# Patient Record
Sex: Male | Born: 1960
Health system: Southern US, Community
[De-identification: ages and names within clinical notes are randomized; demographics above are authoritative.]

## PROBLEM LIST (undated history)

## (undated) DIAGNOSIS — M109 Gout, unspecified: Secondary | ICD-10-CM

## (undated) DIAGNOSIS — E7800 Pure hypercholesterolemia, unspecified: Secondary | ICD-10-CM

## (undated) DIAGNOSIS — I10 Essential (primary) hypertension: Secondary | ICD-10-CM

## (undated) DIAGNOSIS — M1990 Unspecified osteoarthritis, unspecified site: Secondary | ICD-10-CM

## (undated) DIAGNOSIS — K760 Fatty (change of) liver, not elsewhere classified: Secondary | ICD-10-CM

## (undated) HISTORY — DX: Fatty (change of) liver, not elsewhere classified: K76.0

## (undated) HISTORY — PX: JOINT REPLACEMENT: SHX530

## (undated) HISTORY — PX: BACK SURGERY: SHX140

## (undated) HISTORY — PX: OTHER SURGICAL HISTORY: SHX169

---

## 2007-03-15 ENCOUNTER — Ambulatory Visit (HOSPITAL_COMMUNITY): Admission: RE | Admit: 2007-03-15 | Discharge: 2007-03-15 | Payer: Self-pay | Admitting: Family Medicine

## 2009-07-15 ENCOUNTER — Ambulatory Visit (HOSPITAL_COMMUNITY): Admission: RE | Admit: 2009-07-15 | Discharge: 2009-07-15 | Payer: Self-pay | Admitting: Family Medicine

## 2010-04-02 ENCOUNTER — Ambulatory Visit (HOSPITAL_COMMUNITY): Admission: RE | Admit: 2010-04-02 | Discharge: 2010-04-02 | Payer: Self-pay | Admitting: Family Medicine

## 2010-04-02 ENCOUNTER — Encounter (INDEPENDENT_AMBULATORY_CARE_PROVIDER_SITE_OTHER): Payer: Self-pay | Admitting: *Deleted

## 2010-04-08 ENCOUNTER — Encounter (INDEPENDENT_AMBULATORY_CARE_PROVIDER_SITE_OTHER): Payer: Self-pay | Admitting: *Deleted

## 2010-05-20 ENCOUNTER — Ambulatory Visit: Payer: Self-pay | Admitting: Gastroenterology

## 2010-05-20 DIAGNOSIS — R740 Nonspecific elevation of levels of transaminase and lactic acid dehydrogenase [LDH]: Secondary | ICD-10-CM

## 2010-05-21 ENCOUNTER — Ambulatory Visit (HOSPITAL_COMMUNITY): Admission: RE | Admit: 2010-05-21 | Discharge: 2010-05-21 | Payer: Self-pay | Admitting: Gastroenterology

## 2010-05-21 ENCOUNTER — Encounter: Payer: Self-pay | Admitting: Gastroenterology

## 2010-05-21 LAB — CONVERTED CEMR LAB
AST: 52 units/L — ABNORMAL HIGH (ref 0–37)
Alkaline Phosphatase: 69 units/L (ref 39–117)
BUN: 16 mg/dL (ref 6–23)
CO2: 25 meq/L (ref 19–32)
Chloride: 111 meq/L (ref 96–112)
Creatinine, Ser: 0.9 mg/dL (ref 0.4–1.5)
Glucose, Bld: 101 mg/dL — ABNORMAL HIGH (ref 70–99)
HCT: 39.2 % (ref 39.0–52.0)
Hemoglobin: 13.5 g/dL (ref 13.0–17.0)
IgG (Immunoglobin G), Serum: 1219 mg/dL (ref 694–1618)
IgM, Serum: 106 mg/dL (ref 60–263)
MCHC: 34.5 g/dL (ref 30.0–36.0)
MCV: 93.9 fL (ref 78.0–100.0)
Monocytes Relative: 8.4 % (ref 3.0–12.0)
Neutrophils Relative %: 44.9 % (ref 43.0–77.0)
Potassium: 4.3 meq/L (ref 3.5–5.1)
Total Bilirubin: 0.4 mg/dL (ref 0.3–1.2)
WBC: 6.8 10*3/uL (ref 4.5–10.5)

## 2010-11-16 ENCOUNTER — Ambulatory Visit: Payer: Self-pay | Admitting: Gastroenterology

## 2010-11-17 LAB — CONVERTED CEMR LAB
A-1 Antitrypsin, Ser: 202 mg/dL — ABNORMAL HIGH (ref 83–200)
Hep B S Ab: NEGATIVE
Tissue Transglutaminase Ab, IgA: 2.9 units (ref <20)

## 2010-12-15 NOTE — Assessment & Plan Note (Signed)
History of Present Illness Visit Type: consult  Primary GI MD: Rob Bunting MD Primary Provider: Lilyan Punt, MD Requesting Provider: Lilyan Punt, MD Chief Complaint: Abnormal labs and [place]  History of Present Illness:       very pleasant 50 year old man who is here with wife today.  recent LFTs 2 months ago showed elvated transaminases ) AST and ALT were in the low 60s. The rest of his liver tests were all normal. He a repeat set of liver testing done the end of May and this showed his AST and ALT were 50 and 54 respectively. The rest of his liver tests were normal. His CBC was normal. Hepatitis B. surface antigen was negative hepatitis C antibody was negative, ceruloplasmin level was normal, iron studies were normal, sedimentation rate was slightly elevated at 20.  He started simvastatin in the past 3 or 4 months.  He had an ultrasound done and this showed increased echogenicity of his liver, most consistent with fatty liver disease. They also described an area of further hypo-echogenicity in his liver which was indeterminant in nature. Followup imaging was recommended.    Never told about liver disease in the past.    has lost about 20 pounds in past year, cutting back on sodas a day, other high calorie foods, making a big difference.  started simvastatin in past 3-4 months.  she has never had a tattoo, he has never used IV drugs, he has never received a blood transfusion.           Current Medications (verified): 1)  Hydrochlorothiazide 25 Mg Tabs (Hydrochlorothiazide) .Marland Kitchen.. 1 By Mouth Once Daily 2)  Klor-Con M20 20 Meq Cr-Tabs (Potassium Chloride Crys Cr) .Marland Kitchen.. 1 By Mouth Once Daily 3)  Simvastatin 20 Mg Tabs (Simvastatin) .Marland Kitchen.. 1 By Mouth Once Daily 4)  Allopurinol 100 Mg Tabs (Allopurinol) .Marland Kitchen.. 1 By Mouth Two Times A Day 5)  Lisinopril 5 Mg Tabs (Lisinopril) .Marland Kitchen.. 1 By Mouth Once Daily 6)  Tramadol Hcl 100 Mg Xr24h-Tab (Tramadol Hcl) .... One Tablet By Mouth Two Times A  Day 7)  Colcrys 0.6 Mg Tabs (Colchicine) .... One Tablet By Mouth Once Daily  Allergies (verified): No Known Drug Allergies  Past History:  Past Medical History: Hypertension Autoimmune illness fatty liver Hyperlipidemia morbid obesity gout Arthritis  Past Surgical History: Hernia Surgery At age 46  Family History: no liver disease, no colon cancer  Social History: he is married, he has one son, he works as a Environmental health practitioner, he does not smoke cigarettes, he rarely drinks alcohol.  Review of Systems       Pertinent positive and negative review of systems were noted in the above HPI and GI specific review of systems.  All other review of systems was otherwise negative.   Vital Signs:  Patient profile:   50 year old male Height:      71 inches Weight:      280 pounds BMI:     39.19 BSA:     2.44 Pulse rate:   76 / minute Pulse rhythm:   regular BP sitting:   138 / 84  (left arm) Cuff size:   large  Vitals Entered By: Ok Anis CMA (May 20, 2010 9:13 AM)  Physical Exam  Additional Exam:  Constitutional: morbidly obese, otherwise well-appearing Psychiatric: alert and oriented times 3 Eyes: extraocular movements intact Mouth: oropharynx moist, no lesions Neck: supple, no lymphadenopathy Cardiovascular: heart regular rate and rythm Lungs: CTA bilaterally Abdomen: soft,  non-tender, non-distended, no obvious ascites, no peritoneal signs, normal bowel sounds Extremities: no lower extremity edema bilaterally Skin: no lesions on visible extremities    Impression & Recommendations:  Problem # 1:  elevated liver tests, morbid obesity, abnormal ultrasound of liver I think most likely his transaminases elevation is related to fatty liver disease. Possibly simvastatin is contributing however at such low level transaminase elevation I see no reason for him to stop his cholesterol medicine. We will get further blood workup for other chronic liver disease, see does  summarized below. I have complimented him on his weight loss is encouraged and continue this trend which will likely help his overall health as well as his liver specifically. We will set up an MRI to further evaluate the abnormalities noted by ultrasound, I think it these are fatty liver related. We will contact him when the results are completed  Other Orders: TLB-CBC Platelet - w/Differential (85025-CBCD) TLB-CMP (Comprehensive Metabolic Pnl) (80053-COMP) TLB-PT (Protime) (85610-PTP) TLB-IgA (Immunoglobulin A) (82784-IGA) T-Tissue Transglutamase Ab IgA (16109-60454) T-Alpha-1-Antitrypsin Tot [phone]) T-Anti SMA (29562-13086) T-ANA (57846-96295) T-AMA (28413-24401) T-Hepatitis B Surface Antibody (02725-36644) TLB-IgM (Immunoglobulin M) (82784-IGM) TLB-IgG (Immunoglobulin G) (82784-IGG)  Patient Instructions: 1)  We will set up MRI of liver. 2)  The following labs:  Hepatitis A (IgM and IgG), Hepatitis B surface antibody, ANA, AMA, anti smooth muscle antibody, alpha 1 antitrypsin, TTG, total IgA level, CBC, CMET, INR. 3)  Continue losing weight, this is the most important issue in your overall health and will help your liver too. 4)  A copy of this information will be sent to Dr. Gerda Diss. 5)  The medication list was reviewed and reconciled.  All changed / newly prescribed medications were explained.  A complete medication list was provided to the patient / caregiver.  Appended Document: Orders Update/MRI    Clinical Lists Changes  Orders: Added new Referral order of MRI Liver (MRI Liver) - Signed

## 2010-12-15 NOTE — Letter (Signed)
Summary: New Patient letter  [facility]  [address] [place], [place] [address]   Phone: [phone]  Fax: [phone]       04/08/2010 MRN: [id number]  Manuel Torres [address] [place], [place]  [address]  Dear Manuel Torres,  Welcome to the Gastroenterology Division at [facility]    You are scheduled to see Dr.  Christella Hartigan on 05-20-10 at 9am on the 3rd floor at [facility], [address]  We ask that you try to arrive at our office 15 minutes prior to your appointment time to allow for check-in.  We would like you to complete the enclosed self-administered evaluation form prior to your visit and bring it with you on the day of your appointment.  We will review it with you.  Also, please bring a complete list of all your medications or, if you prefer, bring the medication bottles and we will list them.  Please bring your insurance card so that we may make a copy of it.  If your insurance requires a referral to see a specialist, please bring your referral form from your primary care physician.  Co-payments are due at the time of your visit and may be paid by cash, check or credit card.     Your office visit will consist of a consult with your physician (includes a physical exam), any laboratory testing he/she may order, scheduling of any necessary diagnostic testing (e.g. x-ray, ultrasound, CT-scan), and scheduling of a procedure (e.g. Endoscopy, Colonoscopy) if required.  Please allow enough time on your schedule to allow for any/all of these possibilities.    If you cannot keep your appointment, please call [phone] to cancel or reschedule prior to your appointment date.  This allows [place] the opportunity to schedule an appointment for another patient in need of care.  If you do not cancel or reschedule by 5 p.m. the business day prior to your appointment date, you will be charged a $50.00 late cancellation/no-show fee.    Thank you for choosing [facility] for your medical needs.  We appreciate the opportunity to care for you.  Please visit [place] at our website  to learn more about our practice.                     Sincerely,                                                             The Gastroenterology Division

## 2012-02-10 ENCOUNTER — Ambulatory Visit (HOSPITAL_COMMUNITY)
Admission: RE | Admit: 2012-02-10 | Discharge: 2012-02-10 | Disposition: A | Discharge status: Home / Self Care | Payer: 59 | Source: Ambulatory Visit | Attending: Family Medicine | Admitting: Family Medicine

## 2012-02-10 ENCOUNTER — Other Ambulatory Visit: Payer: Self-pay | Admitting: Family Medicine

## 2012-02-10 DIAGNOSIS — M25551 Pain in right hip: Secondary | ICD-10-CM

## 2012-02-10 DIAGNOSIS — M25559 Pain in unspecified hip: Secondary | ICD-10-CM | POA: Insufficient documentation

## 2012-02-10 DIAGNOSIS — M5137 Other intervertebral disc degeneration, lumbosacral region: Secondary | ICD-10-CM | POA: Insufficient documentation

## 2012-02-10 DIAGNOSIS — M51379 Other intervertebral disc degeneration, lumbosacral region without mention of lumbar back pain or lower extremity pain: Secondary | ICD-10-CM | POA: Insufficient documentation

## 2012-05-24 ENCOUNTER — Telehealth (INDEPENDENT_AMBULATORY_CARE_PROVIDER_SITE_OTHER): Payer: Self-pay | Admitting: *Deleted

## 2012-05-24 ENCOUNTER — Encounter (INDEPENDENT_AMBULATORY_CARE_PROVIDER_SITE_OTHER): Payer: Self-pay | Admitting: *Deleted

## 2012-05-24 ENCOUNTER — Other Ambulatory Visit (INDEPENDENT_AMBULATORY_CARE_PROVIDER_SITE_OTHER): Payer: Self-pay | Admitting: *Deleted

## 2012-05-24 DIAGNOSIS — Z1211 Encounter for screening for malignant neoplasm of colon: Secondary | ICD-10-CM

## 2012-05-24 MED ORDER — PEG-KCL-NACL-NASULF-NA ASC-C 100 G PO SOLR: 1.0000 | Freq: Once | ORAL | Status: DC

## 2012-05-24 NOTE — Telephone Encounter (Signed)
Patient needs movi prep 

## 2012-06-14 ENCOUNTER — Encounter (INDEPENDENT_AMBULATORY_CARE_PROVIDER_SITE_OTHER): Payer: Self-pay | Admitting: *Deleted

## 2012-07-12 ENCOUNTER — Telehealth (INDEPENDENT_AMBULATORY_CARE_PROVIDER_SITE_OTHER): Payer: Self-pay | Admitting: *Deleted

## 2012-07-12 NOTE — Telephone Encounter (Signed)
PCP/Requesting MD: scott luking  Name & DOB: Manuel Torres [birth year]     Procedure: tcs  Reason/Indication:  screening  Has patient had this procedure before?  no  If so, when, by whom and where?    Is there a family history of colon cancer?  no  Who?  What age when diagnosed?    Is patient diabetic?   no      Does patient have prosthetic heart valve?  no  Do you have a pacemaker?  no  Has patient had joint replacement within last 12 months?  no  Is patient on Coumadin, Plavix and/or Aspirin? no  Medications: allopurinol 300 mg daily, nabumetone 500 mg daily, atorvastatin 20 mg daily, lisinopril 5 mg daily, hctz 25 mg daily, klor con 20 meq daily   Allergies: nkda  Medication Adjustment:   Procedure date & time: 08/03/12 at 830

## 2012-07-14 NOTE — Telephone Encounter (Signed)
agree

## 2012-07-24 ENCOUNTER — Encounter (HOSPITAL_COMMUNITY): Payer: Self-pay | Admitting: Pharmacy Technician

## 2012-08-03 ENCOUNTER — Encounter (HOSPITAL_COMMUNITY): Payer: Self-pay | Admitting: *Deleted

## 2012-08-03 ENCOUNTER — Encounter (HOSPITAL_COMMUNITY)
Admission: RE | Disposition: A | Discharge status: Home / Self Care | Payer: Self-pay | Source: Ambulatory Visit | Attending: Internal Medicine

## 2012-08-03 ENCOUNTER — Ambulatory Visit (HOSPITAL_COMMUNITY)
Admission: RE | Admit: 2012-08-03 | Discharge: 2012-08-03 | Disposition: A | Discharge status: Home / Self Care | Payer: 59 | Source: Ambulatory Visit | Attending: Internal Medicine | Admitting: Internal Medicine

## 2012-08-03 DIAGNOSIS — K5730 Diverticulosis of large intestine without perforation or abscess without bleeding: Secondary | ICD-10-CM | POA: Insufficient documentation

## 2012-08-03 DIAGNOSIS — D126 Benign neoplasm of colon, unspecified: Secondary | ICD-10-CM

## 2012-08-03 DIAGNOSIS — Z1211 Encounter for screening for malignant neoplasm of colon: Secondary | ICD-10-CM

## 2012-08-03 DIAGNOSIS — E7800 Pure hypercholesterolemia, unspecified: Secondary | ICD-10-CM | POA: Insufficient documentation

## 2012-08-03 DIAGNOSIS — I10 Essential (primary) hypertension: Secondary | ICD-10-CM | POA: Insufficient documentation

## 2012-08-03 HISTORY — DX: Pure hypercholesterolemia, unspecified: E78.00

## 2012-08-03 HISTORY — DX: Essential (primary) hypertension: I10

## 2012-08-03 HISTORY — PX: COLONOSCOPY: SHX5424

## 2012-08-03 SURGERY — COLONOSCOPY
Anesthesia: Moderate Sedation

## 2012-08-03 MED ORDER — SODIUM CHLORIDE 0.45 % IV SOLN
Freq: Once | INTRAVENOUS | Status: AC
  Administered 2012-08-03: 08:00:00 via INTRAVENOUS

## 2012-08-03 MED ORDER — MIDAZOLAM HCL 5 MG/5ML IJ SOLN
INTRAMUSCULAR | Status: DC | PRN
  Administered 2012-08-03 (×2): 2 mg via INTRAVENOUS
  Administered 2012-08-03: 1 mg via INTRAVENOUS

## 2012-08-03 MED ORDER — MEPERIDINE HCL 50 MG/ML IJ SOLN
INTRAMUSCULAR | Status: DC | PRN
  Administered 2012-08-03 (×2): 25 mg via INTRAVENOUS

## 2012-08-03 MED ORDER — STERILE WATER FOR IRRIGATION IR SOLN
Status: DC | PRN
  Administered 2012-08-03: 09:00:00

## 2012-08-03 MED ORDER — MEPERIDINE HCL 50 MG/ML IJ SOLN
INTRAMUSCULAR | Status: AC
  Filled 2012-08-03: qty 1

## 2012-08-03 MED ORDER — MIDAZOLAM HCL 5 MG/5ML IJ SOLN
INTRAMUSCULAR | Status: AC
  Filled 2012-08-03: qty 10

## 2012-08-03 NOTE — H&P (Signed)
Manuel Torres is an 51 y.o. male.   Chief Complaint: Patient is here for colonoscopy. HPI: Patient is 51 year old [place] American male who is in for screening colonoscopy. He denies abdominal pain change in his bowel habits or rectal bleeding. This is patient's first exam. Family history is negative for colorectal carcinoma.  Past Medical History  Diagnosis Date  . Hypertension   . Hypercholesteremia     Past Surgical History  Procedure Date  . Right inguinal hernia     Family History  Problem Relation Age of Onset  . Colon cancer Other    Social History:  reports that he has never smoked. He does not have any smokeless tobacco history on file. He reports that he drinks alcohol. He reports that he does not use illicit drugs.  Allergies: No Known Allergies  Medications Prior to Admission  Medication Sig Dispense Refill  . ibuprofen (ADVIL,MOTRIN) 200 MG tablet Take 200 mg by mouth every 6 (six) hours as needed. Pain      . peg 3350 powder (MOVIPREP) 100 G SOLR Take 1 kit (100 g total) by mouth once.  1 kit  0  . allopurinol (ZYLOPRIM) 300 MG tablet Take 300 mg by mouth daily.      Marland Kitchen atorvastatin (LIPITOR) 20 MG tablet Take 20 mg by mouth daily.      . hydrochlorothiazide (HYDRODIURIL) 25 MG tablet Take 25 mg by mouth daily.      Marland Kitchen lisinopril (PRINIVIL,ZESTRIL) 5 MG tablet Take 5 mg by mouth daily.      . nabumetone (RELAFEN) 500 MG tablet Take 500 mg by mouth 2 (two) times daily.      . potassium chloride SA (K-DUR,KLOR-CON) 20 MEQ tablet Take 20 mEq by mouth 2 (two) times daily.        No results found for this or any previous visit (from the past 48 hour(s)). No results found.  ROS  Blood pressure 169/96, pulse 74, temperature 97.7 F (36.5 C), temperature source Oral, resp. rate 18, height 5\' 11"  (1.803 m), weight 280 lb (127.007 kg), SpO2 98.00%. Physical Exam  Constitutional: He appears well-developed and well-nourished.  HENT:  Mouth/Throat: Oropharynx is clear  and moist.  Eyes: Conjunctivae normal are normal. No scleral icterus.  Neck: No thyromegaly present.  Cardiovascular: Normal rate, regular rhythm and normal heart sounds.   No murmur heard. Respiratory: Effort normal.  GI: Soft. He exhibits no distension and no mass. There is no tenderness.  Musculoskeletal: He exhibits no edema.  Lymphadenopathy:    He has no cervical adenopathy.  Neurological: He is alert.  Skin: Skin is warm and dry.     Assessment/Plan Average risk screening colonoscopy.  ManuelNAJEEB Torres 08/03/2012, 8:32 AM

## 2012-08-03 NOTE — Op Note (Signed)
COLONOSCOPY PROCEDURE REPORT  PATIENT:  Manuel Torres  MR#:  [id number] Birthdate:  [birth year], 51 y.o., male Endoscopist:  Dr. Malissa Hippo, MD Referred By:  Dr. Lilyan Punt, MD. Procedure Date: 08/03/2012  Procedure:   Colonoscopy  Indications:  Patient is 51 year old African American male was undergoing average risk screening colonoscopy.  Informed Consent:  The procedure and risks were reviewed with the patient and informed consent was obtained.  Medications:  Demerol 50 mg IV Versed 5 mg IV  Description of procedure:  After a digital rectal exam was performed, that colonoscope was advanced from the anus through the rectum and colon to the area of the cecum, ileocecal valve and appendiceal orifice. The cecum was deeply intubated. These structures were well-seen and photographed for the record. From the level of the cecum and ileocecal valve, the scope was slowly and cautiously withdrawn. The mucosal surfaces were carefully surveyed utilizing scope tip to flexion to facilitate fold flattening as needed. The scope was pulled down into the rectum where a thorough exam including retroflexion was performed.  Findings:   Prep excellent. Few scattered small diverticula at sigmoid colon. 4 mm polyp ablated via cold biopsy from mid sigmoid colon. Normal rectal mucosa and  ano-rectal junction.  Therapeutic/Diagnostic Maneuvers Performed:  See above  Complications:  None  Cecal Withdrawal Time:  12 minutes  Impression:  Examination performed to cecum. Mild sigmoid colon diverticulosis. Small polyp ablated via cold biopsy from mid sigmoid colon.  Recommendations:  Standard instructions given. Patient advised to take ibuprofen while he is on Relafen. I will contact patient with biopsy results and further recommendations.  REHMAN,NAJEEB U  08/03/2012 9:01 AM  CC: Dr. Lilyan Punt, MD & Dr. Bonnetta Barry ref. provider found

## 2012-08-07 ENCOUNTER — Encounter (HOSPITAL_COMMUNITY): Payer: Self-pay | Admitting: Internal Medicine

## 2012-08-16 ENCOUNTER — Encounter (INDEPENDENT_AMBULATORY_CARE_PROVIDER_SITE_OTHER): Payer: Self-pay | Admitting: *Deleted

## 2012-09-07 ENCOUNTER — Other Ambulatory Visit: Payer: Self-pay | Admitting: Orthopedic Surgery

## 2012-09-07 MED ORDER — DEXAMETHASONE SODIUM PHOSPHATE 10 MG/ML IJ SOLN: 10.0000 mg | Freq: Once | INTRAMUSCULAR | Status: DC

## 2012-09-07 MED ORDER — BUPIVACAINE LIPOSOME 1.3 % IJ SUSP: 20.0000 mL | Freq: Once | INTRAMUSCULAR | Status: DC

## 2012-09-07 NOTE — Progress Notes (Signed)
Preoperative surgical orders have been place into the Epic hospital system for Manuel Torres on 09/07/2012, 2:44 PM  by Patrica Duel for surgery on 10/23/12.  Preop Total Hip orders including Experel Injecion, IV Tylenol, and IV Decadron as long as there are no contraindications to the above medications. Avel Peace, PA-C

## 2012-10-16 ENCOUNTER — Encounter (HOSPITAL_COMMUNITY): Payer: Self-pay | Admitting: Pharmacy Technician

## 2012-10-18 ENCOUNTER — Ambulatory Visit (HOSPITAL_COMMUNITY)
Admission: RE | Admit: 2012-10-18 | Discharge: 2012-10-18 | Disposition: A | Discharge status: Home / Self Care | Payer: 59 | Source: Ambulatory Visit | Attending: Orthopedic Surgery | Admitting: Orthopedic Surgery

## 2012-10-18 ENCOUNTER — Encounter (HOSPITAL_COMMUNITY): Payer: Self-pay

## 2012-10-18 ENCOUNTER — Encounter (HOSPITAL_COMMUNITY)
Admission: RE | Admit: 2012-10-18 | Discharge: 2012-10-18 | Disposition: A | Discharge status: Home / Self Care | Payer: 59 | Source: Ambulatory Visit | Attending: Orthopedic Surgery | Admitting: Orthopedic Surgery

## 2012-10-18 DIAGNOSIS — M169 Osteoarthritis of hip, unspecified: Secondary | ICD-10-CM | POA: Insufficient documentation

## 2012-10-18 DIAGNOSIS — Z01818 Encounter for other preprocedural examination: Secondary | ICD-10-CM | POA: Insufficient documentation

## 2012-10-18 DIAGNOSIS — M1610 Unilateral primary osteoarthritis, unspecified hip: Secondary | ICD-10-CM | POA: Insufficient documentation

## 2012-10-18 HISTORY — DX: Gout, unspecified: M10.9

## 2012-10-18 HISTORY — DX: Unspecified osteoarthritis, unspecified site: M19.90

## 2012-10-18 LAB — SURGICAL PCR SCREEN
MRSA, PCR: NEGATIVE
Staphylococcus aureus: POSITIVE — AB

## 2012-10-18 LAB — CBC
HCT: 42.9 % (ref 39.0–52.0)
Hemoglobin: 15.3 g/dL (ref 13.0–17.0)
RDW: 13 % (ref 11.5–15.5)
WBC: 5.5 10*3/uL (ref 4.0–10.5)

## 2012-10-18 LAB — COMPREHENSIVE METABOLIC PANEL
ALT: 43 U/L (ref 0–53)
Albumin: 3.7 g/dL (ref 3.5–5.2)
Alkaline Phosphatase: 99 U/L (ref 39–117)
BUN: 10 mg/dL (ref 6–23)
Chloride: 101 mEq/L (ref 96–112)
Glucose, Bld: 105 mg/dL — ABNORMAL HIGH (ref 70–99)
Potassium: 4.2 mEq/L (ref 3.5–5.1)
Sodium: 137 mEq/L (ref 135–145)
Total Bilirubin: 0.3 mg/dL (ref 0.3–1.2)

## 2012-10-18 LAB — APTT: aPTT: 36 seconds (ref 24–37)

## 2012-10-18 LAB — URINALYSIS, ROUTINE W REFLEX MICROSCOPIC
Leukocytes, UA: NEGATIVE
Nitrite: NEGATIVE
Specific Gravity, Urine: 1.019 (ref 1.005–1.030)
Urobilinogen, UA: 1 mg/dL (ref 0.0–1.0)
pH: 5.5 (ref 5.0–8.0)

## 2012-10-18 LAB — ABO/RH: ABO/RH(D): AB POS

## 2012-10-18 LAB — PROTIME-INR: Prothrombin Time: 12.7 seconds (ref 11.6–15.2)

## 2012-10-18 NOTE — Progress Notes (Signed)
10/18/12 1045  OBSTRUCTIVE SLEEP APNEA  Have you ever been diagnosed with sleep apnea through a sleep study? No  Do you snore loudly (loud enough to be heard through closed doors)?  0  Do you often feel tired, fatigued, or sleepy during the daytime? 0  Has anyone observed you stop breathing during your sleep? 0  Do you have, or are you being treated for high blood pressure? 1  BMI more than 35 kg/m2? 1  Age over 52 years old? 1  Neck circumference greater than 40 cm/18 inches? 0  Gender: 1  Obstructive Sleep Apnea Score 4   Score 4 or greater  Results sent to PCP

## 2012-10-18 NOTE — Patient Instructions (Signed)
YOUR SURGERY IS SCHEDULED AT [facility]  ON:  Monday  12/9  AT 3:15 PM  REPORT TO [facility]ORT STAY [facility] AT:  12:45 PM      PHONE # FOR SHORT STAY IS [phone]  DO NOT EAT ANYTHING AFTER MIDNIGHT THE NIGHT BEFORE YOUR SURGERY.  NO FOOD, NO CHEWING GUM, NO MINTS, NO CANDIES, NO CHEWING TOBACCO. YOU MAY HAVE CLEAR LIQUIDS TO DRINK FROM MIDNIGHT THE NIGHT BEFORE SURGERY - UNTIL 9:00 AM DAY OF SURGERY --LIKE WATER, SODA, APPLE, GRAPE OR CRANBERRY JUICE.   NOTHING TO DRINK AFTER 9:00 AM DAY OF SURGERY.  PLEASE TAKE THE FOLLOWING MEDICATIONS THE AM OF YOUR SURGERY WITH A FEW SIPS OF WATER:  ALLOPURINOL AND ATORVASTATIN   IF YOU USE INHALERS--USE YOUR INHALERS THE AM OF YOUR SURGERY AND BRING INHALERS TO THE HOSPITAL -TAKE TO SURGERY.    IF YOU ARE DIABETIC:  DO NOT TAKE ANY DIABETIC MEDICATIONS THE AM OF YOUR SURGERY.  IF YOU TAKE INSULIN IN THE EVENINGS--PLEASE ONLY TAKE 1/2 NORMAL EVENING DOSE THE NIGHT BEFORE YOUR SURGERY.  NO INSULIN THE AM OF YOUR SURGERY.  IF YOU HAVE SLEEP APNEA AND USE CPAP OR BIPAP--PLEASE BRING THE MASK AND THE TUBING.  DO NOT BRING YOUR MACHINE.  DO NOT BRING VALUABLES, MONEY, CREDIT CARDS.  DO NOT WEAR JEWELRY, MAKE-UP, NAIL POLISH AND NO METAL PINS OR CLIPS IN YOUR HAIR. CONTACT LENS, DENTURES / PARTIALS, GLASSES SHOULD NOT BE WORN TO SURGERY AND IN MOST CASES-HEARING AIDS WILL NEED TO BE REMOVED.  BRING YOUR GLASSES CASE, ANY EQUIPMENT NEEDED FOR YOUR CONTACT LENS. FOR PATIENTS ADMITTED TO THE HOSPITAL--CHECK OUT TIME THE DAY OF DISCHARGE IS 11:00 AM.  ALL INPATIENT ROOMS ARE PRIVATE - WITH BATHROOM, TELEPHONE, TELEVISION AND WIFI INTERNET.  IF YOU ARE BEING DISCHARGED THE SAME DAY OF YOUR SURGERY--YOU CAN NOT DRIVE YOURSELF HOME--AND SHOULD NOT GO HOME ALONE BY TAXI OR BUS.  NO DRIVING OR OPERATING MACHINERY FOR 24 HOURS FOLLOWING ANESTHESIA / PAIN MEDICATIONS.  PLEASE MAKE ARRANGEMENTS FOR SOMEONE TO BE WITH YOU AT HOME THE FIRST 24 HOURS AFTER  SURGERY. RESPONSIBLE DRIVER'S NAME___________________________                                               PHONE #   _______________________                                  PLEASE READ OVER ANY  FACT SHEETS THAT YOU WERE GIVEN: MRSA INFORMATION, BLOOD TRANSFUSION INFORMATION, INCENTIVE SPIROMETER INFORMATION.

## 2012-10-18 NOTE — Pre-Procedure Instructions (Signed)
PREOP CBC, CMET, PT, PTT, UA, T/S AND CXR WERE DONE TODAY AT [facility] AS PER ORDERS DR. Lequita Halt AND ANESTHESIOLOGIST'S GUIDELINES.  PT'S EKG REPORT FROM DR. S. Gerda Diss ON PT'S CHART FROM 08/21/12.

## 2012-10-22 ENCOUNTER — Other Ambulatory Visit: Payer: Self-pay | Admitting: Orthopedic Surgery

## 2012-10-22 NOTE — H&P (Signed)
Manuel Torres  DOB: Jul 19, 1961 Married / Language: English / Race: Black or African American Male  Date of Admission:  10/23/2012  Chief Complaint:  Right Hip Pain  History of Present Illness The patient is a 51 year old male who comes in for a preoperative History and Physical. The patient is scheduled for a right total hip arthroplasty to be performed by Dr. Gus Rankin. Aluisio, MD at [facility] on 10/23/12. Hip problem is described as the following: The patient reports right hip (worse than left) problems including pain symptoms that have been present for 4 year(s). The symptoms began without any known injury. Prior to being seen, the patient was previously evaluated by a colleague. Previous workup for this problem has included hip x-rays (most recent xrays were in March. AP pelvis and 2 views of the right hip are on the [facility] system) and pelvic MRI. Previous treatment for this problem has included corticosteroid injection (in the right) and nonsteroidal anti-inflammatory drugs. His knee is not bothering him anywhere as near as bad as his hips. The right hip is hurting at all times day and night. This is extremely limiting. He cannot do what he desires. He cannot move his leg at all at the level of the hip. He is having pain radiating down his leg. He cannot put shoes or socks on anymore. The left hip is troublesome also but not as bad as the right. He is not having any back pain. He is ready to proceed with hip surgery. They have been treated conservatively in the past for the above stated problem and despite conservative measures, they continue to have progressive pain and severe functional limitations and dysfunction. They have failed non-operative management including home exercise, medications. It is felt that they would benefit from undergoing total joint replacement. Risks and benefits of the procedure have been discussed with the patient and they elect to proceed  with surgery. There are no active contraindications to surgery such as ongoing infection or rapidly progressive neurological disease.   Problem List Osteoarthritis, Hip (715.35) Osteoarthritis, knee (715.96)   Allergies No Known Drug Allergies   Family History Diabetes Mellitus. mother Heart Disease. father and grandmother fathers side Heart disease in male family member before age 71 Cancer. grandmother mothers side Cerebrovascular Accident. father Hypertension. mother, father and grandmother fathers side   Social History Living situation. live with spouse Marital status. married Most recent primary occupation. Maintenance Illicit drug use. no Current work status. working full time Drug/Alcohol Rehab (Currently). no Exercise. Exercises rarely; does running / walking Tobacco use. never smoker Tobacco / smoke exposure. yes Number of flights of stairs before winded. 1 Pain Contract. no Previously in rehab. no Alcohol use. current drinker; drinks beer and hard liquor; only occasionally per week Children. 1 Post-Surgical Plans. Plan is to go home.   Medication History Allopurinol (300MG  Tablet, Oral) Active. Klor-Con M20 ( Tablet ER, Oral) Active. Atorvastatin Calcium (20MG  Tablet, Oral) Active. Lisinopril (5MG  Tablet, Oral) Active. Hydrochlorothiazide (25MG  Tablet, Oral) Active.   Past Surgical History Inguinal Hernia Repair. open: right  Medical History Hypercholesterolemia Chronic Pain Gout High blood pressure   Review of Systems General:Not Present- Chills, Fever, Night Sweats, Fatigue, Weight Gain, Weight Loss and Memory Loss. Skin:Not Present- Hives, Itching, Rash, Eczema and Lesions. HEENT:Not Present- Tinnitus, Headache, Double Vision, Visual Loss, Hearing Loss and Dentures. Respiratory:Not Present- Shortness of breath with exertion, Shortness of breath at rest, Allergies, Coughing up blood and Chronic  Cough. Cardiovascular:Not Present- Chest  Pain, Racing/skipping heartbeats, Difficulty Breathing Lying Down, Murmur, Swelling and Palpitations. Gastrointestinal:Not Present- Bloody Stool, Heartburn, Abdominal Pain, Vomiting, Nausea, Constipation, Diarrhea, Difficulty Swallowing, Jaundice and Loss of appetitie. Male Genitourinary:Not Present- Urinary frequency, Blood in Urine, Weak urinary stream, Discharge, Flank Pain, Incontinence, Painful Urination, Urgency, Urinary Retention and Urinating at Night. Musculoskeletal:Present- Joint Pain. Not Present- Muscle Weakness, Muscle Pain, Joint Swelling, Back Pain, Morning Stiffness and Spasms. Neurological:Not Present- Tremor, Dizziness, Blackout spells, Paralysis, Difficulty with balance and Weakness. Psychiatric:Not Present- Insomnia.   Vitals Weight: 280 lb Height: 71 in Weight was reported by patient. Height was reported by patient. Body Surface Area: 2.52 m Body Mass Index: 39.05 kg/m Pulse: 64 (Regular) Resp.: 12 (Unlabored) BP: 154/94 (Sitting, Left Arm, Standard)    Physical Exam The physical exam findings are as follows:  Note: Patient is a 51 year old male with continued hip pain. Patient is accompanied today by his wife on exam.   General Mental Status - Alert, cooperative and good historian. General Appearance- pleasant. Not in acute distress. Orientation- Oriented X3. Build & Nutrition- Well nourished and Well developed.   Head and Neck Head- normocephalic, atraumatic . Neck Global Assessment- supple. no bruit auscultated on the right and no bruit auscultated on the left.   Eye Pupil- Bilateral- Regular and Round. Note: wears glasses Motion- Bilateral- EOMI.   Chest and Lung Exam Auscultation: Breath sounds:- clear at anterior chest wall and - clear at posterior chest wall. Adventitious sounds:- No Adventitious sounds.   Cardiovascular Auscultation:Rhythm- Regular rate  and rhythm. Heart Sounds- S1 WNL and S2 WNL. Murmurs & Other Heart Sounds:Auscultation of the heart reveals - No Murmurs.   Abdomen Inspection:Contour- Generalized moderate distention. Palpation/Percussion:Tenderness- Abdomen is non-tender to palpation. Rigidity (guarding)- Abdomen is soft. Auscultation:Auscultation of the abdomen reveals - Bowel sounds normal.   Male Genitourinary Not done, not pertinent to present illness  Musculoskeletal On exam, well developed male alert and oriented in no apparent distress. Evaluation of his right hip flexion to 90. No internal or external rotation. No abduction or adduction. Left hip flexion to 95. No internal rotation, about 10 external rotation and 20 abduction.  Knee exams show moderate crepitus on range of motion both knees. Range is about 5-125 on each side. No medial or lateral joint line tenderness. No instability noted. Pulse, sensation and motor intact both lower extremities.  RADIOGRAPHS: AP of both knees and lateral show that he has patellofemoral arthritis, left worse than right knee as well as some medial compartment arthritis. He is not bone on bone. AP pelvis and lateral of both hips show amongst the worst arthritis I have seen. He essentially has fused his right hip with massive osteophyte formation. He is bone on bone throughout. His left hip also has bone on bone changes but there is a tiny bit of joint space left. Large osteophytes on the left but no where near as bad as the right.  Assessment & Plan Osteoarthritis, Hip (715.35) Impression: Right Knee  Note: Plan is for a right total hip replacement by Dr. Lequita Halt.  Plan is to go home.  PCP - Dr. Lilyan Punt  Signed electronically by Roberts Gaudy, PA-C

## 2012-10-23 ENCOUNTER — Inpatient Hospital Stay (HOSPITAL_COMMUNITY)
Admission: RE | Admit: 2012-10-23 | Discharge: 2012-10-25 | DRG: 470 | Disposition: A | Discharge status: Home Health | Payer: 59 | Source: Ambulatory Visit | Attending: Orthopedic Surgery | Admitting: Orthopedic Surgery

## 2012-10-23 ENCOUNTER — Encounter (HOSPITAL_COMMUNITY): Payer: Self-pay | Admitting: Anesthesiology

## 2012-10-23 ENCOUNTER — Encounter (HOSPITAL_COMMUNITY)
Admission: RE | Disposition: A | Discharge status: Home Health | Payer: Self-pay | Source: Ambulatory Visit | Attending: Orthopedic Surgery

## 2012-10-23 ENCOUNTER — Encounter (HOSPITAL_COMMUNITY): Payer: Self-pay | Admitting: *Deleted

## 2012-10-23 ENCOUNTER — Ambulatory Visit (HOSPITAL_COMMUNITY): Payer: 59

## 2012-10-23 ENCOUNTER — Ambulatory Visit (HOSPITAL_COMMUNITY): Payer: 59 | Admitting: Anesthesiology

## 2012-10-23 DIAGNOSIS — I10 Essential (primary) hypertension: Secondary | ICD-10-CM | POA: Diagnosis present

## 2012-10-23 DIAGNOSIS — Z96649 Presence of unspecified artificial hip joint: Secondary | ICD-10-CM

## 2012-10-23 DIAGNOSIS — M169 Osteoarthritis of hip, unspecified: Principal | ICD-10-CM | POA: Diagnosis present

## 2012-10-23 DIAGNOSIS — M1610 Unilateral primary osteoarthritis, unspecified hip: Principal | ICD-10-CM | POA: Diagnosis present

## 2012-10-23 DIAGNOSIS — E871 Hypo-osmolality and hyponatremia: Secondary | ICD-10-CM | POA: Diagnosis not present

## 2012-10-23 HISTORY — PX: TOTAL HIP ARTHROPLASTY: SHX124

## 2012-10-23 LAB — TYPE AND SCREEN

## 2012-10-23 SURGERY — ARTHROPLASTY, HIP, TOTAL,POSTERIOR APPROACH
Anesthesia: General | Site: Hip | Laterality: Right | Wound class: Clean

## 2012-10-23 MED ORDER — CHLORHEXIDINE GLUCONATE 4 % EX LIQD
60.0000 mL | Freq: Once | CUTANEOUS | Status: DC
  Filled 2012-10-23: qty 60

## 2012-10-23 MED ORDER — SODIUM CHLORIDE 0.9 % IV SOLN
INTRAVENOUS | Status: DC
  Administered 2012-10-23: 19:00:00 via INTRAVENOUS

## 2012-10-23 MED ORDER — TRAMADOL HCL 50 MG PO TABS: 50.0000 mg | ORAL_TABLET | Freq: Four times a day (QID) | ORAL | Status: DC | PRN

## 2012-10-23 MED ORDER — HYDROMORPHONE HCL PF 1 MG/ML IJ SOLN
0.2500 mg | INTRAMUSCULAR | Status: DC | PRN
  Administered 2012-10-23 (×4): 0.5 mg via INTRAVENOUS

## 2012-10-23 MED ORDER — DEXAMETHASONE SODIUM PHOSPHATE 10 MG/ML IJ SOLN: 10.0000 mg | Freq: Once | INTRAMUSCULAR | Status: AC

## 2012-10-23 MED ORDER — METOCLOPRAMIDE HCL 5 MG/ML IJ SOLN: 5.0000 mg | Freq: Three times a day (TID) | INTRAMUSCULAR | Status: DC | PRN

## 2012-10-23 MED ORDER — DIPHENHYDRAMINE HCL 12.5 MG/5ML PO ELIX: 12.5000 mg | ORAL_SOLUTION | ORAL | Status: DC | PRN

## 2012-10-23 MED ORDER — LABETALOL HCL 5 MG/ML IV SOLN
5.0000 mg | INTRAVENOUS | Status: DC | PRN
  Administered 2012-10-23 (×2): 5 mg via INTRAVENOUS

## 2012-10-23 MED ORDER — ACETAMINOPHEN 10 MG/ML IV SOLN
1000.0000 mg | Freq: Four times a day (QID) | INTRAVENOUS | Status: AC
  Administered 2012-10-23 – 2012-10-24 (×3): 1000 mg via INTRAVENOUS
  Filled 2012-10-23 (×4): qty 100

## 2012-10-23 MED ORDER — OXYCODONE HCL 5 MG PO TABS
5.0000 mg | ORAL_TABLET | ORAL | Status: DC | PRN
  Administered 2012-10-24 – 2012-10-25 (×7): 10 mg via ORAL
  Filled 2012-10-23 (×7): qty 2

## 2012-10-23 MED ORDER — SODIUM CHLORIDE 0.9 % IV SOLN: INTRAVENOUS | Status: DC

## 2012-10-23 MED ORDER — BISACODYL 10 MG RE SUPP: 10.0000 mg | Freq: Every day | RECTAL | Status: DC | PRN

## 2012-10-23 MED ORDER — DEXTROSE 5 % IV SOLN
3.0000 g | INTRAVENOUS | Status: AC
  Administered 2012-10-23: 3 g via INTRAVENOUS
  Filled 2012-10-23: qty 3000

## 2012-10-23 MED ORDER — FLEET ENEMA 7-19 GM/118ML RE ENEM: 1.0000 | ENEMA | Freq: Once | RECTAL | Status: AC | PRN

## 2012-10-23 MED ORDER — MENTHOL 3 MG MT LOZG: 1.0000 | LOZENGE | OROMUCOSAL | Status: DC | PRN

## 2012-10-23 MED ORDER — METHOCARBAMOL 500 MG PO TABS
500.0000 mg | ORAL_TABLET | Freq: Four times a day (QID) | ORAL | Status: DC | PRN
  Administered 2012-10-24 – 2012-10-25 (×2): 500 mg via ORAL
  Filled 2012-10-23 (×2): qty 1

## 2012-10-23 MED ORDER — FENTANYL CITRATE 0.05 MG/ML IJ SOLN
INTRAMUSCULAR | Status: DC | PRN
  Administered 2012-10-23: 50 ug via INTRAVENOUS
  Administered 2012-10-23 (×2): 100 ug via INTRAVENOUS

## 2012-10-23 MED ORDER — SUCCINYLCHOLINE CHLORIDE 20 MG/ML IJ SOLN
INTRAMUSCULAR | Status: DC | PRN
  Administered 2012-10-23: 100 mg via INTRAVENOUS

## 2012-10-23 MED ORDER — PROMETHAZINE HCL 25 MG/ML IJ SOLN: 6.2500 mg | INTRAMUSCULAR | Status: DC | PRN

## 2012-10-23 MED ORDER — DEXAMETHASONE 4 MG PO TABS
10.0000 mg | ORAL_TABLET | Freq: Once | ORAL | Status: AC
  Administered 2012-10-24: 10 mg via ORAL
  Filled 2012-10-23: qty 1

## 2012-10-23 MED ORDER — LABETALOL HCL 5 MG/ML IV SOLN
INTRAVENOUS | Status: DC | PRN
  Administered 2012-10-23: 5 mg via INTRAVENOUS

## 2012-10-23 MED ORDER — PROPOFOL 10 MG/ML IV BOLUS
INTRAVENOUS | Status: DC | PRN
  Administered 2012-10-23: 200 mg via INTRAVENOUS

## 2012-10-23 MED ORDER — ATORVASTATIN CALCIUM 20 MG PO TABS
20.0000 mg | ORAL_TABLET | Freq: Every day | ORAL | Status: DC
  Administered 2012-10-24: 20 mg via ORAL
  Filled 2012-10-23 (×2): qty 1

## 2012-10-23 MED ORDER — ONDANSETRON HCL 4 MG/2ML IJ SOLN
INTRAMUSCULAR | Status: DC | PRN
  Administered 2012-10-23: 4 mg via INTRAVENOUS

## 2012-10-23 MED ORDER — CEFAZOLIN SODIUM-DEXTROSE 2-3 GM-% IV SOLR
2.0000 g | Freq: Four times a day (QID) | INTRAVENOUS | Status: AC
  Administered 2012-10-23 – 2012-10-24 (×2): 2 g via INTRAVENOUS
  Filled 2012-10-23 (×2): qty 50

## 2012-10-23 MED ORDER — LABETALOL HCL 5 MG/ML IV SOLN
INTRAVENOUS | Status: AC
  Filled 2012-10-23: qty 4

## 2012-10-23 MED ORDER — HYDROMORPHONE HCL PF 1 MG/ML IJ SOLN
INTRAMUSCULAR | Status: AC
  Filled 2012-10-23: qty 1

## 2012-10-23 MED ORDER — BUPIVACAINE LIPOSOME 1.3 % IJ SUSP
20.0000 mL | Freq: Once | INTRAMUSCULAR | Status: AC
  Administered 2012-10-23: 20 mL
  Filled 2012-10-23: qty 20

## 2012-10-23 MED ORDER — PHENOL 1.4 % MT LIQD: 1.0000 | OROMUCOSAL | Status: DC | PRN

## 2012-10-23 MED ORDER — ONDANSETRON HCL 4 MG PO TABS: 4.0000 mg | ORAL_TABLET | Freq: Four times a day (QID) | ORAL | Status: DC | PRN

## 2012-10-23 MED ORDER — ACETAMINOPHEN 650 MG RE SUPP: 650.0000 mg | Freq: Four times a day (QID) | RECTAL | Status: DC | PRN

## 2012-10-23 MED ORDER — CEFAZOLIN SODIUM-DEXTROSE 2-3 GM-% IV SOLR
INTRAVENOUS | Status: AC
  Filled 2012-10-23: qty 50

## 2012-10-23 MED ORDER — LIDOCAINE HCL (CARDIAC) 20 MG/ML IV SOLN
INTRAVENOUS | Status: DC | PRN
  Administered 2012-10-23: 50 mg via INTRAVENOUS

## 2012-10-23 MED ORDER — HYDROMORPHONE HCL PF 1 MG/ML IJ SOLN
INTRAMUSCULAR | Status: DC | PRN
  Administered 2012-10-23 (×4): 1 mg via INTRAVENOUS

## 2012-10-23 MED ORDER — ALLOPURINOL 300 MG PO TABS
300.0000 mg | ORAL_TABLET | Freq: Every day | ORAL | Status: DC
  Administered 2012-10-24 – 2012-10-25 (×2): 300 mg via ORAL
  Filled 2012-10-23 (×3): qty 1

## 2012-10-23 MED ORDER — POTASSIUM CHLORIDE CRYS ER 20 MEQ PO TBCR
20.0000 meq | EXTENDED_RELEASE_TABLET | Freq: Every day | ORAL | Status: DC
  Administered 2012-10-23 – 2012-10-25 (×3): 20 meq via ORAL
  Filled 2012-10-23 (×3): qty 1

## 2012-10-23 MED ORDER — ACETAMINOPHEN 10 MG/ML IV SOLN: 1000.0000 mg | Freq: Once | INTRAVENOUS | Status: DC

## 2012-10-23 MED ORDER — METOCLOPRAMIDE HCL 10 MG PO TABS: 5.0000 mg | ORAL_TABLET | Freq: Three times a day (TID) | ORAL | Status: DC | PRN

## 2012-10-23 MED ORDER — MORPHINE SULFATE 2 MG/ML IJ SOLN
1.0000 mg | INTRAMUSCULAR | Status: DC | PRN
  Administered 2012-10-23 – 2012-10-24 (×2): 2 mg via INTRAVENOUS
  Filled 2012-10-23 (×2): qty 1

## 2012-10-23 MED ORDER — CEFAZOLIN SODIUM 1-5 GM-% IV SOLN
INTRAVENOUS | Status: AC
  Filled 2012-10-23: qty 50

## 2012-10-23 MED ORDER — POLYETHYLENE GLYCOL 3350 17 G PO PACK: 17.0000 g | PACK | Freq: Every day | ORAL | Status: DC | PRN

## 2012-10-23 MED ORDER — SODIUM CHLORIDE 0.9 % IJ SOLN
INTRAMUSCULAR | Status: DC | PRN
  Administered 2012-10-23: 50 mL via INTRAVENOUS

## 2012-10-23 MED ORDER — MIDAZOLAM HCL 5 MG/5ML IJ SOLN
INTRAMUSCULAR | Status: DC | PRN
  Administered 2012-10-23: 2 mg via INTRAVENOUS

## 2012-10-23 MED ORDER — HYDROCHLOROTHIAZIDE 25 MG PO TABS
25.0000 mg | ORAL_TABLET | Freq: Every day | ORAL | Status: DC
  Administered 2012-10-24: 25 mg via ORAL
  Filled 2012-10-23 (×2): qty 1

## 2012-10-23 MED ORDER — ONDANSETRON HCL 4 MG/2ML IJ SOLN: 4.0000 mg | Freq: Four times a day (QID) | INTRAMUSCULAR | Status: DC | PRN

## 2012-10-23 MED ORDER — LACTATED RINGERS IV SOLN
INTRAVENOUS | Status: DC
  Administered 2012-10-23: 15:00:00 via INTRAVENOUS
  Administered 2012-10-23: 1000 mL via INTRAVENOUS

## 2012-10-23 MED ORDER — RIVAROXABAN 10 MG PO TABS
10.0000 mg | ORAL_TABLET | Freq: Every day | ORAL | Status: DC
  Administered 2012-10-24 – 2012-10-25 (×2): 10 mg via ORAL
  Filled 2012-10-23 (×3): qty 1

## 2012-10-23 MED ORDER — DOCUSATE SODIUM 100 MG PO CAPS
100.0000 mg | ORAL_CAPSULE | Freq: Two times a day (BID) | ORAL | Status: DC
  Administered 2012-10-24 – 2012-10-25 (×3): 100 mg via ORAL

## 2012-10-23 MED ORDER — ROCURONIUM BROMIDE 100 MG/10ML IV SOLN
INTRAVENOUS | Status: DC | PRN
  Administered 2012-10-23: 5 mg via INTRAVENOUS
  Administered 2012-10-23: 35 mg via INTRAVENOUS

## 2012-10-23 MED ORDER — GLYCOPYRROLATE 0.2 MG/ML IJ SOLN
INTRAMUSCULAR | Status: DC | PRN
  Administered 2012-10-23: .7 mg via INTRAVENOUS

## 2012-10-23 MED ORDER — NEOSTIGMINE METHYLSULFATE 1 MG/ML IJ SOLN
INTRAMUSCULAR | Status: DC | PRN
  Administered 2012-10-23: 4 mg via INTRAVENOUS

## 2012-10-23 MED ORDER — METHOCARBAMOL 100 MG/ML IJ SOLN
500.0000 mg | Freq: Four times a day (QID) | INTRAVENOUS | Status: DC | PRN
  Administered 2012-10-23: 500 mg via INTRAVENOUS
  Filled 2012-10-23: qty 5

## 2012-10-23 MED ORDER — ACETAMINOPHEN 325 MG PO TABS: 650.0000 mg | ORAL_TABLET | Freq: Four times a day (QID) | ORAL | Status: DC | PRN

## 2012-10-23 SURGICAL SUPPLY — 52 items
BAG ZIPLOCK 12X15 (MISCELLANEOUS) ×2 IMPLANT
BIT DRILL 2.8X128 (BIT) ×2 IMPLANT
BLADE EXTENDED COATED 6.5IN (ELECTRODE) ×2 IMPLANT
BLADE SAW SAG 73X25 THK (BLADE) ×1
BLADE SAW SGTL 73X25 THK (BLADE) ×1 IMPLANT
CLOTH BEACON ORANGE TIMEOUT ST (SAFETY) ×2 IMPLANT
DECANTER SPIKE VIAL GLASS SM (MISCELLANEOUS) ×2 IMPLANT
DRAPE INCISE IOBAN 66X45 STRL (DRAPES) ×2 IMPLANT
DRAPE ORTHO SPLIT 77X108 STRL (DRAPES) ×2
DRAPE POUCH INSTRU U-SHP 10X18 (DRAPES) ×2 IMPLANT
DRAPE SURG ORHT 6 SPLT 77X108 (DRAPES) ×2 IMPLANT
DRAPE U-SHAPE 47X51 STRL (DRAPES) ×2 IMPLANT
DRSG ADAPTIC 3X8 NADH LF (GAUZE/BANDAGES/DRESSINGS) ×2 IMPLANT
DRSG MEPILEX BORDER 4X4 (GAUZE/BANDAGES/DRESSINGS) ×2 IMPLANT
DRSG MEPILEX BORDER 4X8 (GAUZE/BANDAGES/DRESSINGS) ×2 IMPLANT
DURAPREP 26ML APPLICATOR (WOUND CARE) ×2 IMPLANT
ELECT REM PT RETURN 9FT ADLT (ELECTROSURGICAL) ×2
ELECTRODE REM PT RTRN 9FT ADLT (ELECTROSURGICAL) ×1 IMPLANT
EVACUATOR 1/8 PVC DRAIN (DRAIN) ×2 IMPLANT
FACESHIELD LNG OPTICON STERILE (SAFETY) ×8 IMPLANT
GAUZE SPONGE 2X2 8PLY STRL LF (GAUZE/BANDAGES/DRESSINGS) ×1 IMPLANT
GLOVE BIO SURGEON STRL SZ8 (GLOVE) ×2 IMPLANT
GLOVE BIOGEL PI IND STRL 8 (GLOVE) ×2 IMPLANT
GLOVE BIOGEL PI INDICATOR 8 (GLOVE) ×2
GLOVE ECLIPSE 8.0 STRL XLNG CF (GLOVE) ×2 IMPLANT
GLOVE SURG SS PI 6.5 STRL IVOR (GLOVE) ×4 IMPLANT
GOWN STRL NON-REIN LRG LVL3 (GOWN DISPOSABLE) ×4 IMPLANT
GOWN STRL REIN XL XLG (GOWN DISPOSABLE) ×2 IMPLANT
IMMOBILIZER KNEE 20 (SOFTGOODS)
IMMOBILIZER KNEE 20 THIGH 36 (SOFTGOODS) IMPLANT
IMMOBILIZER KNEE 22 UNIV (SOFTGOODS) ×2 IMPLANT
KIT BASIN OR (CUSTOM PROCEDURE TRAY) ×2 IMPLANT
MANIFOLD NEPTUNE II (INSTRUMENTS) ×2 IMPLANT
NDL SAFETY ECLIPSE 18X1.5 (NEEDLE) ×1 IMPLANT
NEEDLE HYPO 18GX1.5 SHARP (NEEDLE) ×1
NS IRRIG 1000ML POUR BTL (IV SOLUTION) ×2 IMPLANT
PACK TOTAL JOINT (CUSTOM PROCEDURE TRAY) ×2 IMPLANT
PASSER SUT SWANSON 36MM LOOP (INSTRUMENTS) ×2 IMPLANT
POSITIONER SURGICAL ARM (MISCELLANEOUS) ×2 IMPLANT
SPONGE GAUZE 2X2 STER 10/PKG (GAUZE/BANDAGES/DRESSINGS) ×1
SPONGE GAUZE 4X4 12PLY (GAUZE/BANDAGES/DRESSINGS) ×2 IMPLANT
STRIP CLOSURE SKIN 1/2X4 (GAUZE/BANDAGES/DRESSINGS) ×2 IMPLANT
SUT ETHIBOND NAB CT1 #1 30IN (SUTURE) ×4 IMPLANT
SUT MNCRL AB 4-0 PS2 18 (SUTURE) ×2 IMPLANT
SUT VIC AB 2-0 CT1 27 (SUTURE) ×3
SUT VIC AB 2-0 CT1 TAPERPNT 27 (SUTURE) ×3 IMPLANT
SUT VLOC 180 0 24IN GS25 (SUTURE) ×4 IMPLANT
SYR 50ML LL SCALE MARK (SYRINGE) ×2 IMPLANT
TOWEL OR 17X26 10 PK STRL BLUE (TOWEL DISPOSABLE) ×4 IMPLANT
TOWEL OR NON WOVEN STRL DISP B (DISPOSABLE) ×2 IMPLANT
TRAY FOLEY CATH 14FRSI W/METER (CATHETERS) ×2 IMPLANT
WATER STERILE IRR 1500ML POUR (IV SOLUTION) ×4 IMPLANT

## 2012-10-23 NOTE — Op Note (Signed)
Pre-operative diagnosis- Osteoarthritis Right hip  Post-operative diagnosis- Osteoarthritis  Right hip  Procedure-  RightTotal Hip Arthroplasty  Surgeon- Manuel Rankin. Edelmira Gallogly, MD  Assistant- Dimitri Ped, PA-C   Anesthesia  General  EBL- 150   Drain Hemovac   Complication- None  Condition-PACU - hemodynamically stable.   Brief Clinical Note- Manuel Torres is a 51 y.o. male with end stage arthritis of his right hip with progressively worsening pain and dysfunction. Pain occurs with activity and rest including pain at night. He has tried analgesics, protected weight bearing and rest without benefit. Pain is too severe to attempt physical therapy. Radiographs demonstrate severe bone on bone arthritis with subchondral cyst formation and massive circumferential osteophytes. He presents now for right THA.  Procedure in detail-   The patient is brought into the operating room and placed on the operating table. After successful administration of General  anesthesia, the patient is placed in the  Left lateral decubitus position with the  Right side up and held in place with the hip positioner. The lower extremity is isolated from the perineum with plastic drapes and time-out is performed by the surgical team. The lower extremity is then prepped and draped in the usual sterile fashion. A short posterolateral incision is made with a ten blade through the subcutaneous tissue to the level of the fascia lata which is incised in line with the skin incision. The sciatic nerve is palpated and protected and the short external rotators and capsule are isolated from the femur. The hip is then dislocated and the center of the femoral head is marked. A trial prosthesis is placed such that the trial head corresponds to the center of the patients' native femoral head. The resection level is marked on the femoral neck and the resection is made with an oscillating saw. The femoral head is removed and femoral  retractors placed to gain access to the femoral canal.      The canal finder is passed into the femoral canal and the canal is thoroughly irrigated with sterile saline to remove the fatty contents. Axial reaming is performed to 15.5  mm, proximal reaming to 20 D  and the sleeve machined to a small. A 20 D small trial sleeve is placed into the proximal femur.      The femur is then retracted anteriorly to gain acetabular exposure. Acetabular retractors are placed and the labrum and osteophytes are removed, Acetabular reaming is performed to 53  mm and a 54  mm Pinnacle acetabular shell is placed in anatomic position with excellent purchase. Additional dome screws were not needed. The permanent 36 mm neutral + 4 Marathon liner is placed into the acetabular shell.      The trial femur is then placed into the femoral canal. The size is 20 x 15  stem with a 36 + 12  neck and a 36 + 6 head with the neck version matching  the patients' native anteversion. The hip is reduced with excellent stability with full extension and full external rotation, 70 degrees flexion with 40 degrees adduction and 90 degrees internal rotation and 90 degrees of flexion with 70 degrees of internal rotation. The operative leg is placed on top of the non-operative leg and the leg lengths are found to be equal. The trials are then removed and the permanent implant of the same size is impacted into the femoral canal. The ceramic femoral head of the same size as the trial is placed and the hip is  reduced with the same stability parameters. The operative leg is again placed on top of the non-operative leg and the leg lengths are found to be equal.      The wound is then copiously irrigated with saline solution and the capsule and short external rotators are re-attached to the femur through drill holes with Ethibond suture. The fascia lata is closed over a hemovac drain with #1 vicryl suture and the fascia lata, gluteal muscles and subcutaneous  tissues are injected with Exparel 20ml diluted with saline 50ml. The subcutaneous tissues are closed with #1 and2-0 vicryl and the subcuticular layer closed with running 4-0 Monocryl. The drain is hooked to suction, incision cleaned and dried, and steri-srips and a bulky sterile dressing applied. The limb is placed into a knee immobilizer and the patient is awakened and transported to recovery in stable condition.      Please note that a surgical assistant was a medical necessity for this procedure in order to perform it in a safe and expeditious manner. The assistant was necessary to provide retraction to the vital neurovascular structures and to retract and position the limb to allow for anatomic placement of the prosthetic components.  Manuel Rankin Tonda Wiederhold, MD    10/23/2012, 4:04 PM

## 2012-10-23 NOTE — Interval H&P Note (Signed)
History and Physical Interval Note:  10/23/2012 2:22 PM  Manuel Torres  has presented today for surgery, with the diagnosis of Osteoarthritis of the Right hip  The various methods of treatment have been discussed with the patient and family. After consideration of risks, benefits and other options for treatment, the patient has consented to  Procedure(s) (LRB) with comments: TOTAL HIP ARTHROPLASTY (Right) as a surgical intervention .  The patient's history has been reviewed, patient examined, no change in status, stable for surgery.  I have reviewed the patient's chart and labs.  Questions were answered to the patient's satisfaction.     Loanne Drilling

## 2012-10-23 NOTE — Transfer of Care (Signed)
Immediate Anesthesia Transfer of Care Note  Patient: Manuel Torres  Procedure(s) Performed: Procedure(s) (LRB): TOTAL HIP ARTHROPLASTY (Right)  Patient Location: PACU  Anesthesia Type: General  Level of Consciousness: sedated, patient cooperative and responds to stimulaton  Airway & Oxygen Therapy: Patient Spontanous Breathing and Patient connected to face mask oxgen  Post-op Assessment: Report given to PACU RN and Post -op Vital signs reviewed and stable  Post vital signs: Reviewed and stable  Complications: No apparent anesthesia complications

## 2012-10-23 NOTE — Anesthesia Postprocedure Evaluation (Signed)
  Anesthesia Post-op Note  Patient: Manuel Torres  Procedure(s) Performed: Procedure(s) (LRB): TOTAL HIP ARTHROPLASTY (Right)  Patient Location: PACU  Anesthesia Type: General  Level of Consciousness: awake and alert   Airway and Oxygen Therapy: Patient Spontanous Breathing  Post-op Pain: mild  Post-op Assessment: Post-op Vital signs reviewed, Patient's Cardiovascular Status Stable, Respiratory Function Stable, Patent Airway and No signs of Nausea or vomiting  Last Vitals:  Filed Vitals:   10/23/12 1715  BP: 164/91  Pulse:   Temp:   Resp:     Post-op Vital Signs: stable   Complications: No apparent anesthesia complications. BP near baseline after 15 mg labetolol.

## 2012-10-23 NOTE — H&P (View-Only) (Signed)
Manuel Torres  DOB: 10/05/1961 Married / Language: English / Race: Black or African American Male  Date of Admission:  10/23/2012  Chief Complaint:  Right Hip Pain  History of Present Illness The patient is a 51 year old male who comes in for a preoperative History and Physical. The patient is scheduled for a right total hip arthroplasty to be performed by Dr. Frank V. Aluisio, MD at [facility] on 10/23/12. Hip problem is described as the following: The patient reports right hip (worse than left) problems including pain symptoms that have been present for 4 year(s). The symptoms began without any known injury. Prior to being seen, the patient was previously evaluated by a colleague. Previous workup for this problem has included hip x-rays (most recent xrays were in March. AP pelvis and 2 views of the right hip are on the [facility] system) and pelvic MRI. Previous treatment for this problem has included corticosteroid injection (in the right) and nonsteroidal anti-inflammatory drugs. His knee is not bothering him anywhere as near as bad as his hips. The right hip is hurting at all times day and night. This is extremely limiting. He cannot do what he desires. He cannot move his leg at all at the level of the hip. He is having pain radiating down his leg. He cannot put shoes or socks on anymore. The left hip is troublesome also but not as bad as the right. He is not having any back pain. He is ready to proceed with hip surgery. They have been treated conservatively in the past for the above stated problem and despite conservative measures, they continue to have progressive pain and severe functional limitations and dysfunction. They have failed non-operative management including home exercise, medications. It is felt that they would benefit from undergoing total joint replacement. Risks and benefits of the procedure have been discussed with the patient and they elect to proceed  with surgery. There are no active contraindications to surgery such as ongoing infection or rapidly progressive neurological disease.   Problem List Osteoarthritis, Hip (715.35) Osteoarthritis, knee (715.96)   Allergies No Known Drug Allergies   Family History Diabetes Mellitus. mother Heart Disease. father and grandmother fathers side Heart disease in male family member before age 55 Cancer. grandmother mothers side Cerebrovascular Accident. father Hypertension. mother, father and grandmother fathers side   Social History Living situation. live with spouse Marital status. married Most recent primary occupation. Maintenance Illicit drug use. no Current work status. working full time Drug/Alcohol Rehab (Currently). no Exercise. Exercises rarely; does running / walking Tobacco use. never smoker Tobacco / smoke exposure. yes Number of flights of stairs before winded. 1 Pain Contract. no Previously in rehab. no Alcohol use. current drinker; drinks beer and hard liquor; only occasionally per week Children. 1 Post-Surgical Plans. Plan is to go home.   Medication History Allopurinol (300MG Tablet, Oral) Active. Klor-Con M20 (20MEQ Tablet ER, Oral) Active. Atorvastatin Calcium (20MG Tablet, Oral) Active. Lisinopril (5MG Tablet, Oral) Active. Hydrochlorothiazide (25MG Tablet, Oral) Active.   Past Surgical History Inguinal Hernia Repair. open: right  Medical History Hypercholesterolemia Chronic Pain Gout High blood pressure   Review of Systems General:Not Present- Chills, Fever, Night Sweats, Fatigue, Weight Gain, Weight Loss and Memory Loss. Skin:Not Present- Hives, Itching, Rash, Eczema and Lesions. HEENT:Not Present- Tinnitus, Headache, Double Vision, Visual Loss, Hearing Loss and Dentures. Respiratory:Not Present- Shortness of breath with exertion, Shortness of breath at rest, Allergies, Coughing up blood and Chronic  Cough. Cardiovascular:Not Present- Chest   Pain, Racing/skipping heartbeats, Difficulty Breathing Lying Down, Murmur, Swelling and Palpitations. Gastrointestinal:Not Present- Bloody Stool, Heartburn, Abdominal Pain, Vomiting, Nausea, Constipation, Diarrhea, Difficulty Swallowing, Jaundice and Loss of appetitie. Male Genitourinary:Not Present- Urinary frequency, Blood in Urine, Weak urinary stream, Discharge, Flank Pain, Incontinence, Painful Urination, Urgency, Urinary Retention and Urinating at Night. Musculoskeletal:Present- Joint Pain. Not Present- Muscle Weakness, Muscle Pain, Joint Swelling, Back Pain, Morning Stiffness and Spasms. Neurological:Not Present- Tremor, Dizziness, Blackout spells, Paralysis, Difficulty with balance and Weakness. Psychiatric:Not Present- Insomnia.   Vitals Weight: 280 lb Height: 71 in Weight was reported by patient. Height was reported by patient. Body Surface Area: 2.52 m Body Mass Index: 39.05 kg/m Pulse: 64 (Regular) Resp.: 12 (Unlabored) BP: 154/94 (Sitting, Left Arm, Standard)    Physical Exam The physical exam findings are as follows:  Note: Patient is a 51 year old male with continued hip pain. Patient is accompanied today by his wife on exam.   General Mental Status - Alert, cooperative and good historian. General Appearance- pleasant. Not in acute distress. Orientation- Oriented X3. Build & Nutrition- Well nourished and Well developed.   Head and Neck Head- normocephalic, atraumatic . Neck Global Assessment- supple. no bruit auscultated on the right and no bruit auscultated on the left.   Eye Pupil- Bilateral- Regular and Round. Note: wears glasses Motion- Bilateral- EOMI.   Chest and Lung Exam Auscultation: Breath sounds:- clear at anterior chest wall and - clear at posterior chest wall. Adventitious sounds:- No Adventitious sounds.   Cardiovascular Auscultation:Rhythm- Regular rate  and rhythm. Heart Sounds- S1 WNL and S2 WNL. Murmurs & Other Heart Sounds:Auscultation of the heart reveals - No Murmurs.   Abdomen Inspection:Contour- Generalized moderate distention. Palpation/Percussion:Tenderness- Abdomen is non-tender to palpation. Rigidity (guarding)- Abdomen is soft. Auscultation:Auscultation of the abdomen reveals - Bowel sounds normal.   Male Genitourinary Not done, not pertinent to present illness  Musculoskeletal On exam, well developed male alert and oriented in no apparent distress. Evaluation of his right hip flexion to 90. No internal or external rotation. No abduction or adduction. Left hip flexion to 95. No internal rotation, about 10 external rotation and 20 abduction.  Knee exams show moderate crepitus on range of motion both knees. Range is about 5-125 on each side. No medial or lateral joint line tenderness. No instability noted. Pulse, sensation and motor intact both lower extremities.  RADIOGRAPHS: AP of both knees and lateral show that he has patellofemoral arthritis, left worse than right knee as well as some medial compartment arthritis. He is not bone on bone. AP pelvis and lateral of both hips show amongst the worst arthritis I have seen. He essentially has fused his right hip with massive osteophyte formation. He is bone on bone throughout. His left hip also has bone on bone changes but there is a tiny bit of joint space left. Large osteophytes on the left but no where near as bad as the right.  Assessment & Plan Osteoarthritis, Hip (715.35) Impression: Right Knee  Note: Plan is for a right total hip replacement by Dr. Aluisio.  Plan is to go home.  PCP - Dr. Scott Luking  Signed electronically by DREW L Gery Sabedra, PA-C  

## 2012-10-23 NOTE — Plan of Care (Signed)
Problem: Consults Goal: Diagnosis- Total Joint Replacement Right total hip     

## 2012-10-23 NOTE — Anesthesia Preprocedure Evaluation (Addendum)
Anesthesia Evaluation  Patient identified by MRN, date of birth, ID band Patient awake    Reviewed: Allergy & Precautions, H&P , NPO status , Patient's Chart, lab work & pertinent test results, reviewed documented beta blocker date and time   Airway Mallampati: II TM Distance: >3 FB Neck ROM: full    Dental No notable dental hx.    Pulmonary neg pulmonary ROS,  breath sounds clear to auscultation  Pulmonary exam normal       Cardiovascular Exercise Tolerance: Good hypertension, Pt. on medications Rhythm:regular Rate:Normal     Neuro/Psych negative neurological ROS  negative psych ROS   GI/Hepatic negative GI ROS, Neg liver ROS,   Endo/Other  negative endocrine ROS  Renal/GU negative Renal ROS  negative genitourinary   Musculoskeletal   Abdominal   Peds  Hematology negative hematology ROS (+)   Anesthesia Other Findings   Reproductive/Obstetrics negative OB ROS                          Anesthesia Physical Anesthesia Plan  ASA: II  Anesthesia Plan: General ETT   Post-op Pain Management:    Induction:   Airway Management Planned:   Additional Equipment:   Intra-op Plan:   Post-operative Plan:   Informed Consent: I have reviewed the patients History and Physical, chart, labs and discussed the procedure including the risks, benefits and alternatives for the proposed anesthesia with the patient or authorized representative who has indicated his/her understanding and acceptance.   Dental Advisory Given  Plan Discussed with: CRNA  Anesthesia Plan Comments:         Anesthesia Quick Evaluation

## 2012-10-24 ENCOUNTER — Encounter (HOSPITAL_COMMUNITY): Payer: Self-pay | Admitting: Orthopedic Surgery

## 2012-10-24 DIAGNOSIS — E871 Hypo-osmolality and hyponatremia: Secondary | ICD-10-CM

## 2012-10-24 LAB — CBC
Hemoglobin: 13.4 g/dL (ref 13.0–17.0)
MCH: 31.2 pg (ref 26.0–34.0)
Platelets: 297 10*3/uL (ref 150–400)
RBC: 4.29 MIL/uL (ref 4.22–5.81)
WBC: 6.6 10*3/uL (ref 4.0–10.5)

## 2012-10-24 LAB — BASIC METABOLIC PANEL
CO2: 27 mEq/L (ref 19–32)
Chloride: 98 mEq/L (ref 96–112)
Glucose, Bld: 161 mg/dL — ABNORMAL HIGH (ref 70–99)
Potassium: 4.1 mEq/L (ref 3.5–5.1)
Sodium: 133 mEq/L — ABNORMAL LOW (ref 135–145)

## 2012-10-24 MED ORDER — HYDROCHLOROTHIAZIDE 25 MG PO TABS
25.0000 mg | ORAL_TABLET | Freq: Every day | ORAL | Status: DC
  Administered 2012-10-25: 25 mg via ORAL

## 2012-10-24 MED ORDER — METHOCARBAMOL 500 MG PO TABS: 500.0000 mg | ORAL_TABLET | Freq: Four times a day (QID) | ORAL | Status: DC | PRN

## 2012-10-24 MED ORDER — RIVAROXABAN 10 MG PO TABS: 10.0000 mg | ORAL_TABLET | Freq: Every day | ORAL | Status: DC

## 2012-10-24 MED ORDER — ATORVASTATIN CALCIUM 20 MG PO TABS
20.0000 mg | ORAL_TABLET | Freq: Every day | ORAL | Status: DC
  Administered 2012-10-25: 20 mg via ORAL

## 2012-10-24 MED ORDER — OXYCODONE HCL 5 MG PO TABS: 5.0000 mg | ORAL_TABLET | ORAL | Status: DC | PRN

## 2012-10-24 NOTE — Progress Notes (Signed)
Physical Therapy Treatment Patient Details Name: Manuel Torres MRN: [id number] DOB: 12/22/1960 Today's Date: 10/24/2012 Time: 2536-6440 PT Time Calculation (min): 32 min  PT Assessment / Plan / Recommendation Comments on Treatment Session       Follow Up Recommendations  Home health PT     Does the patient have the potential to tolerate intense rehabilitation     Barriers to Discharge        Equipment Recommendations  Rolling walker with 5" wheels    Recommendations for Other Services OT consult  Frequency 7X/week   Plan Discharge plan remains appropriate    Precautions / Restrictions Precautions Precautions: Posterior Hip Precaution Comments: pt recalls 2/3 THP without cues Restrictions Weight Bearing Restrictions: No Other Position/Activity Restrictions: WBAT   Pertinent Vitals/Pain 3-4/10; ice pack provided    Mobility  Bed Mobility Bed Mobility: Sit to Supine Sit to Supine: 3: Mod assist Details for Bed Mobility Assistance: cues for sequence, use of UEs and L LE to self assist and adherence to THP Transfers Transfers: Sit to Stand;Stand to Sit Sit to Stand: 4: Min assist;From chair/3-in-1;With armrests;3: Mod assist Stand to Sit: 4: Min assist Details for Transfer Assistance: cues for ue's/le's Ambulation/Gait Ambulation/Gait Assistance: 4: Min assist;4: Min guard Ambulation Distance (Feet): 62 Feet (and 20) Assistive device: Rolling walker Ambulation/Gait Assistance Details: cues for posture, sequence and position from RW Gait Pattern: Step-to pattern Gait velocity: slooooow Stairs: No    Exercises     PT Diagnosis:    PT Problem List:   PT Treatment Interventions:     PT Goals Acute Rehab PT Goals PT Goal Formulation: With patient Time For Goal Achievement: 10/28/12 Potential to Achieve Goals: Good Pt will go Supine/Side to Sit: with supervision PT Goal: Supine/Side to Sit - Progress: Goal set today Pt will go Sit to Supine/Side: with  supervision PT Goal: Sit to Supine/Side - Progress: Goal set today Pt will go Sit to Stand: with supervision PT Goal: Sit to Stand - Progress: Goal set today Pt will go Stand to Sit: with supervision PT Goal: Stand to Sit - Progress: Progressing toward goal Pt will Ambulate: 51 - 150 feet;with supervision;with rolling walker PT Goal: Ambulate - Progress: Progressing toward goal Pt will Go Up / Down Stairs: 1-2 stairs;with min assist;with least restrictive assistive device PT Goal: Up/Down Stairs - Progress: Goal set today  Visit Information  Last PT Received On: 10/24/12 Assistance Needed: +1    Subjective Data  Subjective: I like you guys but I am looking fwd to home Patient Stated Goal: Resume previous lifestyle with decreased pain   Cognition  Overall Cognitive Status: Appears within functional limits for tasks assessed/performed Arousal/Alertness: Awake/alert Orientation Level: Appears intact for tasks assessed Behavior During Session: [facility] for tasks performed    Balance     End of Session PT - End of Session Activity Tolerance: Patient tolerated treatment well Patient left: in bed;with call bell/phone within reach;with family/visitor present Nurse Communication: Mobility status   GP     Manuel Torres 10/24/2012, 4:50 PM

## 2012-10-24 NOTE — Evaluation (Signed)
Physical Therapy Evaluation Patient Details Name: Manuel Torres MRN: [id number] DOB: 06-16-1961 Today's Date: 10/24/2012 Time: 4098-1191 PT Time Calculation (min): 43 min  PT Assessment / Plan / Recommendation Clinical Impression  Pt s/p R THR presents with decreased R LE strength/ROM , post THP and post op pain limiting functional mobility    PT Assessment  Patient needs continued PT services    Follow Up Recommendations  Home health PT    Does the patient have the potential to tolerate intense rehabilitation      Barriers to Discharge None      Equipment Recommendations  Rolling walker with 5" wheels    Recommendations for Other Services OT consult   Frequency 7X/week    Precautions / Restrictions Precautions Precautions: Posterior Hip Precaution Comments: sign hung in room Restrictions Weight Bearing Restrictions: No Other Position/Activity Restrictions: WBAT   Pertinent Vitals/Pain 3/10; premedicated, ice pack provided      Mobility  Bed Mobility Bed Mobility: Supine to Sit Supine to Sit: 3: Mod assist Details for Bed Mobility Assistance: cues for sequence, use of UEs and L LE to self assist and adherence to THP Transfers Transfers: Sit to Stand;Stand to Sit Sit to Stand: 3: Mod assist Stand to Sit: 3: Mod assist Details for Transfer Assistance: cues for use of UEs and for LE management Ambulation/Gait Ambulation/Gait Assistance: 4: Min assist Ambulation Distance (Feet): 51 Feet Assistive device: Rolling walker Ambulation/Gait Assistance Details: cues for posture, position from RW, sequence and ER on R Gait Pattern: Step-to pattern Gait velocity: slooooow Stairs: No    Shoulder Instructions     Exercises Total Joint Exercises Ankle Circles/Pumps: AROM;Both;10 reps;Supine Quad Sets: AROM;10 reps;Supine;Both Heel Slides: AAROM;Right;10 reps;Supine Hip ABduction/ADduction: 10 reps;AROM;Right;Supine   PT Diagnosis: Difficulty walking  PT Problem  List: Decreased strength;Decreased range of motion;Decreased activity tolerance;Decreased mobility;Decreased knowledge of use of DME;Obesity;Pain;Decreased knowledge of precautions PT Treatment Interventions: DME instruction;Gait training;Stair training;Functional mobility training;Therapeutic activities;Therapeutic exercise;Patient/family education   PT Goals Acute Rehab PT Goals PT Goal Formulation: With patient Time For Goal Achievement: 10/28/12 Potential to Achieve Goals: Good Pt will go Supine/Side to Sit: with supervision PT Goal: Supine/Side to Sit - Progress: Goal set today Pt will go Sit to Supine/Side: with supervision PT Goal: Sit to Supine/Side - Progress: Goal set today Pt will go Sit to Stand: with supervision PT Goal: Sit to Stand - Progress: Goal set today Pt will go Stand to Sit: with supervision PT Goal: Stand to Sit - Progress: Goal set today Pt will Ambulate: 51 - 150 feet;with supervision;with rolling walker PT Goal: Ambulate - Progress: Goal set today Pt will Go Up / Down Stairs: 1-2 stairs;with min assist;with least restrictive assistive device PT Goal: Up/Down Stairs - Progress: Goal set today  Visit Information  Last PT Received On: 10/24/12 Assistance Needed: +1    Subjective Data  Subjective: That was not as bad as I thought it was going to be Patient Stated Goal: Resume previous lifestyle with decreased pain   Prior Functioning  Home Living Lives With: Spouse Available Help at Discharge: Family Type of Home: House Home Access: Stairs to enter Secretary/administrator of Steps: 2 Entrance Stairs-Rails: None Home Layout: One level Prior Function Level of Independence: Independent Able to Take Stairs?: Yes Driving: Yes Communication Communication: No difficulties    Cognition  Overall Cognitive Status: Appears within functional limits for tasks assessed/performed Arousal/Alertness: Awake/alert Orientation Level: Appears intact for tasks  assessed Behavior During Session: [facility] for tasks performed  Extremity/Trunk Assessment Right Upper Extremity Assessment RUE ROM/Strength/Tone: WFL for tasks assessed Left Upper Extremity Assessment LUE ROM/Strength/Tone: WFL for tasks assessed Right Lower Extremity Assessment RLE ROM/Strength/Tone: Deficits RLE ROM/Strength/Tone Deficits: HIP STRENGTH 2/5 With AAROM to 75 flex and 20 abd Left Lower Extremity Assessment LLE ROM/Strength/Tone: Deficits LLE ROM/Strength/Tone Deficits: strength WFL with ABD limited to 15   Balance    End of Session PT - End of Session Equipment Utilized During Treatment: Gait belt Activity Tolerance: Patient tolerated treatment well Patient left: in chair;with call bell/phone within reach;with family/visitor present Nurse Communication: Mobility status  GP     Manuel Torres 10/24/2012, 12:22 PM

## 2012-10-24 NOTE — Evaluation (Addendum)
Occupational Therapy Evaluation Patient Details Name: Manuel Torres MRN: [id number] DOB: [birth year] Today's Date: 10/24/2012 Time: 4098-1191 OT Time Calculation (min): 34 min  OT Assessment / Plan / Recommendation Clinical Impression  This 51 year old man underwent R posterior approach THA.  He will benefit from continued OT to reinforce thps during functional tasks.  Anticipate one more session in acute setting    OT Assessment  Patient needs continued OT Services    Follow Up Recommendations  No OT follow up    Barriers to Discharge      Equipment Recommendations  3 in 1 bedside comode    Recommendations for Other Services    Frequency  Min 2X/week    Precautions / Restrictions Precautions Precautions: Posterior Hip Precaution Comments: sign hung in room Restrictions Other Position/Activity Restrictions: WBAT   Pertinent Vitals/Pain No pain sitting; 6/10 walking to bathroom, RLE.  Repositioned with ice    ADL  Lower Body Dressing: Simulated;Minimal assistance (with AE) Where Assessed - Lower Body Dressing: Supported sit to stand Toilet Transfer: Performed;Minimal assistance Toilet Transfer Method: Sit to Barista: Raised toilet seat with arms (or 3-in-1 over toilet) Transfers/Ambulation Related to ADLs: pt ambulated to bathroom.  Pt ambulated slowly to bathroom and cues were given for step length.  Pt has high commode at home, and this will not work for his THP's.  After backing up to commode and determining that it was too low, pt sat on 3:1 on top of commode.  Pt needs mod cues to extend RLE and reach hands backwards.  Min cues for thps.   ADL Comments: Pt has been using AE but his reacher is broken.  Educated to toss sock aid out at an angle so that he doesn't internally rotate.  Pt will sponge bathe until he can step over tub:  demonstrated technique.  Pt initially needed mod A for sit to stand during ADLs from recliner:  Was able to get up from  3:1 with min A.  Pt was able to verbalize 1 thp initially and got second with 1 cue.  He does not cross legs.  Pt has been using AE for years. Educated on tub transfer.  Pt will sponge bathe until he can step over tub.  He is currently walking with PWB.   OT Diagnosis: Generalized weakness  OT Problem List: Decreased strength;Pain;Decreased knowledge of precautions;Decreased activity tolerance OT Treatment Interventions: Self-care/ADL training;DME and/or AE instruction;Therapeutic activities;Patient/family education   OT Goals Acute Rehab OT Goals OT Goal Formulation: With patient Time For Goal Achievement: 10/31/12 Potential to Achieve Goals: Good ADL Goals Pt Will Transfer to Toilet: with supervision;Ambulation;3-in-1 ADL Goal: Toilet Transfer - Progress: Goal set today Pt Will Perform Toileting - Hygiene: with supervision;Standing at 3-in-1/toilet ADL Goal: Toileting - Hygiene - Progress: Goal set today Miscellaneous OT Goals Miscellaneous OT Goal #1: Pt will recall 3/3 thps and verbalize tossing sock aid to the right of right foot to follow precaution during dressing OT Goal: Miscellaneous Goal #1 - Progress: Goal set today  Visit Information  Last OT Received On: 10/24/12 Assistance Needed: +1    Subjective Data  Subjective: I think my toilet is about this high Patient Stated Goal: home   Prior Functioning     Home Living Bathroom Shower/Tub: Tub/shower unit Bathroom Toilet: Handicapped height (17 1/2") Prior Function Level of Independence: Independent         Vision/Perception     Cognition  Overall Cognitive Status: Appears within functional  limits for tasks assessed/performed Arousal/Alertness: Awake/alert Orientation Level: Appears intact for tasks assessed Behavior During Session: [facility] for tasks performed    Extremity/Trunk Assessment Right Upper Extremity Assessment RUE ROM/Strength/Tone: [facility] for tasks assessed Left Upper Extremity Assessment LUE  ROM/Strength/Tone: WFL for tasks assessed     Mobility Transfers Sit to Stand: 4: Min assist;From chair/3-in-1;With armrests;3: Mod assist (mod from recliner; min from 3:1) Details for Transfer Assistance: cues for ue's/le's     Shoulder Instructions     Exercise     Balance     End of Session OT - End of Session Activity Tolerance: Patient tolerated treatment well Patient left: in chair;with call bell/phone within reach  GO     Manuel Torres 10/24/2012, 2:58 PM [name], OTR/L [phone] 10/24/2012

## 2012-10-24 NOTE — Progress Notes (Signed)
   Subjective: 1 Day Post-Op Procedure(s) (LRB): TOTAL HIP ARTHROPLASTY (Right) Patient reports pain as mild.   Patient seen in rounds with Dr. Lequita Halt.  Family in room at bedside. Patient is well, and has had no acute complaints or problems We will start therapy today.  Plan is to go Home after hospital stay.  Objective: Vital signs in last 24 hours: Temp:  [97.4 F (36.3 C)-99.6 F (37.6 C)] 99.6 F (37.6 C) (12/10 0641) Pulse Rate:  [68-87] 77  (12/10 0641) Resp:  [14-18] 16  (12/10 0641) BP: (131-207)/(79-119) 133/82 mmHg (12/10 0641) SpO2:  [91 %-100 %] 91 % (12/10 0641) Weight:  [124.286 kg (274 lb)] 124.286 kg (274 lb) (12/09 1745)  Intake/Output from previous day:  Intake/Output Summary (Last 24 hours) at 10/24/12 0829 Last data filed at 10/24/12 0735  Gross per 24 hour  Intake   4085 ml  Output   1360 ml  Net   2725 ml    Intake/Output this shift: UOP 400 since MN  Labs:  [facility] 10/24/12 0455  HGB 13.4    Basename 10/24/12 0455  WBC 6.6  RBC 4.29  HCT 39.1  PLT 297    Basename 10/24/12 0455  NA 133*  K 4.1  CL 98  CO2 27  BUN 9  CREATININE 0.84  GLUCOSE 161*  CALCIUM 8.5   No results found for this basename: LABPT:2,INR:2 in the last 72 hours  EXAM General - Patient is Alert, Appropriate and Oriented Extremity - Neurovascular intact Sensation intact distally Dorsiflexion/Plantar flexion intact Dressing - dressing C/D/I Motor Function - intact, moving foot and toes well on exam.  Hemovac pulled without difficulty.  Past Medical History  Diagnosis Date  . Hypertension   . Hypercholesteremia   . Arthritis   . Gout     NO RECENT FLARE UPS    Assessment/Plan: 1 Day Post-Op Procedure(s) (LRB): TOTAL HIP ARTHROPLASTY (Right) Principal Problem:  *OA (osteoarthritis) of hip  Estimated Body mass index is 38.22 kg/(m^2) as calculated from the following:   Height as of this encounter: 5\' 11" (1.803 m).   Weight as of this encounter:  274 lb(124.286 kg). Advance diet Up with therapy Plan for discharge tomorrow or Thursday Discharge home with home health  DVT Prophylaxis - Xarelto Weight Bearing As Tolerated right Leg D/C Knee Immobilizer Hemovac Pulled Begin Therapy Hip Preacutions No vaccines.  Jihan Mellette 10/24/2012, 8:29 AM

## 2012-10-25 LAB — BASIC METABOLIC PANEL
BUN: 9 mg/dL (ref 6–23)
CO2: 26 mEq/L (ref 19–32)
Calcium: 9.2 mg/dL (ref 8.4–10.5)
Chloride: 95 mEq/L — ABNORMAL LOW (ref 96–112)
Creatinine, Ser: 0.82 mg/dL (ref 0.50–1.35)

## 2012-10-25 LAB — CBC
HCT: 37.4 % — ABNORMAL LOW (ref 39.0–52.0)
MCH: 31.6 pg (ref 26.0–34.0)
MCV: 89.5 fL (ref 78.0–100.0)
RBC: 4.18 MIL/uL — ABNORMAL LOW (ref 4.22–5.81)
RDW: 12.8 % (ref 11.5–15.5)
WBC: 12.9 10*3/uL — ABNORMAL HIGH (ref 4.0–10.5)

## 2012-10-25 NOTE — Progress Notes (Signed)
Discharge summary sent to payer through [facility]  

## 2012-10-25 NOTE — Discharge Summary (Signed)
Physician Discharge Summary   Patient ID: Manuel Torres MRN: [id number] DOB/AGE: 52-May-1962 52 y.o.  Admit date: 10/23/2012 Discharge date: 10/25/2012  Primary Diagnosis: Osteoarthritis Right hip   Admission Diagnoses:  Past Medical History  Diagnosis Date  . Hypertension   . Hypercholesteremia   . Arthritis   . Gout     NO RECENT FLARE UPS   Discharge Diagnoses:   Principal Problem:  *OA (osteoarthritis) of hip Active Problems:  Postop Hyponatremia  Estimated Body mass index is 38.22 kg/(m^2) as calculated from the following:   Height as of this encounter: 5\' 11" (1.803 m).   Weight as of this encounter: 274 lb(124.286 kg).  Classification of overweight in adults according to BMI (WHO, 1998)   Procedure: Procedure(s) (LRB): TOTAL HIP ARTHROPLASTY (Right)   Consults: None  HPI: Manuel Torres is a 51 y.o. male with end stage arthritis of his right hip with progressively worsening pain and dysfunction. Pain occurs with activity and rest including pain at night. He has tried analgesics, protected weight bearing and rest without benefit. Pain is too severe to attempt physical therapy. Radiographs demonstrate severe bone on bone arthritis with subchondral cyst formation and massive circumferential osteophytes. He presents now for right THA.  Laboratory Data: Admission on 10/23/2012  Component Date Value Range Status  . WBC 10/24/2012 6.6  4.0 - 10.5 K/uL Final  . RBC 10/24/2012 4.29  4.22 - 5.81 MIL/uL Final  . Hemoglobin 10/24/2012 13.4  13.0 - 17.0 g/dL Final  . HCT 40/98/1191 39.1  39.0 - 52.0 % Final  . MCV 10/24/2012 91.1  78.0 - 100.0 fL Final  . MCH 10/24/2012 31.2  26.0 - 34.0 pg Final  . MCHC 10/24/2012 34.3  30.0 - 36.0 g/dL Final  . RDW 47/82/9562 13.1  11.5 - 15.5 % Final  . Platelets 10/24/2012 297  150 - 400 K/uL Final  . Sodium 10/24/2012 133* 135 - 145 mEq/L Final  . Potassium 10/24/2012 4.1  3.5 - 5.1 mEq/L Final  . Chloride 10/24/2012 98  96 -  112 mEq/L Final  . CO2 10/24/2012 27  19 - 32 mEq/L Final  . Glucose, Bld 10/24/2012 161* 70 - 99 mg/dL Final  . BUN 13/06/6577 9  6 - 23 mg/dL Final  . Creatinine, Ser 10/24/2012 0.84  0.50 - 1.35 mg/dL Final  . Calcium 46/96/2952 8.5  8.4 - 10.5 mg/dL Final  . GFR calc non Af Amer 10/24/2012 >90  >90 mL/min Final  . GFR calc Af Amer 10/24/2012 >90  >90 mL/min Final   Comment:                                 The eGFR has been calculated                          using the CKD EPI equation.                          This calculation has not been                          validated in all clinical                          situations.  eGFR's persistently                          <90 mL/min signify                          possible Chronic Kidney Disease.  . WBC 10/25/2012 12.9* 4.0 - 10.5 K/uL Final  . RBC 10/25/2012 4.18* 4.22 - 5.81 MIL/uL Final  . Hemoglobin 10/25/2012 13.2  13.0 - 17.0 g/dL Final  . HCT 14/78/2956 37.4* 39.0 - 52.0 % Final  . MCV 10/25/2012 89.5  78.0 - 100.0 fL Final  . MCH 10/25/2012 31.6  26.0 - 34.0 pg Final  . MCHC 10/25/2012 35.3  30.0 - 36.0 g/dL Final  . RDW 21/30/8657 12.8  11.5 - 15.5 % Final  . Platelets 10/25/2012 313  150 - 400 K/uL Final  . Sodium 10/25/2012 131* 135 - 145 mEq/L Final  . Potassium 10/25/2012 4.0  3.5 - 5.1 mEq/L Final  . Chloride 10/25/2012 95* 96 - 112 mEq/L Final  . CO2 10/25/2012 26  19 - 32 mEq/L Final  . Glucose, Bld 10/25/2012 189* 70 - 99 mg/dL Final  . BUN 84/69/6295 9  6 - 23 mg/dL Final  . Creatinine, Ser 10/25/2012 0.82  0.50 - 1.35 mg/dL Final  . Calcium 28/41/3244 9.2  8.4 - 10.5 mg/dL Final  . GFR calc non Af Amer 10/25/2012 >90  >90 mL/min Final  . GFR calc Af Amer 10/25/2012 >90  >90 mL/min Final   Comment:                                 The eGFR has been calculated                          using the CKD EPI equation.                          This calculation has not been                           validated in all clinical                          situations.                          eGFR's persistently                          <90 mL/min signify                          possible Chronic Kidney Disease.  Hospital Outpatient Visit on 10/18/2012  Component Date Value Range Status  . MRSA, PCR 10/18/2012 NEGATIVE  NEGATIVE Final  . Staphylococcus aureus 10/18/2012 POSITIVE* NEGATIVE Final   Comment:                                 The Xpert SA Assay (FDA  approved for NASAL specimens                          in patients over 86 years of age),                          is one component of                          a comprehensive surveillance                          program.  Test performance has                          been validated by [employer] for patients greater                          than or equal to 48 year old.                          It is not intended                          to diagnose infection nor to                          guide or monitor treatment.  Marland Kitchen aPTT 10/18/2012 36  24 - 37 seconds Final  . WBC 10/18/2012 5.5  4.0 - 10.5 K/uL Final  . RBC 10/18/2012 4.80  4.22 - 5.81 MIL/uL Final  . Hemoglobin 10/18/2012 15.3  13.0 - 17.0 g/dL Final  . HCT 16/08/9603 42.9  39.0 - 52.0 % Final  . MCV 10/18/2012 89.4  78.0 - 100.0 fL Final  . MCH 10/18/2012 31.9  26.0 - 34.0 pg Final  . MCHC 10/18/2012 35.7  30.0 - 36.0 g/dL Final  . RDW 54/07/8118 13.0  11.5 - 15.5 % Final  . Platelets 10/18/2012 337  150 - 400 K/uL Final  . Sodium 10/18/2012 137  135 - 145 mEq/L Final  . Potassium 10/18/2012 4.2  3.5 - 5.1 mEq/L Final  . Chloride 10/18/2012 101  96 - 112 mEq/L Final  . CO2 10/18/2012 27  19 - 32 mEq/L Final  . Glucose, Bld 10/18/2012 105* 70 - 99 mg/dL Final  . BUN 14/78/2956 10  6 - 23 mg/dL Final  . Creatinine, Ser 10/18/2012 0.84  0.50 - 1.35 mg/dL Final  . Calcium 21/30/8657 9.5  8.4 - 10.5 mg/dL Final    . Total Protein 10/18/2012 7.5  6.0 - 8.3 g/dL Final  . Albumin 84/69/6295 3.7  3.5 - 5.2 g/dL Final  . AST 28/41/3244 43* 0 - 37 U/L Final  . ALT 10/18/2012 43  0 - 53 U/L Final  . Alkaline Phosphatase 10/18/2012 99  39 - 117 U/L Final  . Total Bilirubin 10/18/2012 0.3  0.3 - 1.2 mg/dL Final  . GFR calc non Af Amer 10/18/2012 >90  >90 mL/min Final  . GFR calc Af Amer 10/18/2012 >90  >90 mL/min Final   Comment:  The eGFR has been calculated                          using the CKD EPI equation.                          This calculation has not been                          validated in all clinical                          situations.                          eGFR's persistently                          <90 mL/min signify                          possible Chronic Kidney Disease.  Marland Kitchen Prothrombin Time 10/18/2012 12.7  11.6 - 15.2 seconds Final  . INR 10/18/2012 0.96  0.00 - 1.49 Final  . ABO/RH(D) 10/18/2012 AB POS   Final  . Antibody Screen 10/18/2012 NEG   Final  . Sample Expiration 10/18/2012 10/26/2012   Final  . Color, Urine 10/18/2012 YELLOW  YELLOW Final  . APPearance 10/18/2012 CLEAR  CLEAR Final  . Specific Gravity, Urine 10/18/2012 1.019  1.005 - 1.030 Final  . pH 10/18/2012 5.5  5.0 - 8.0 Final  . Glucose, UA 10/18/2012 NEGATIVE  NEGATIVE mg/dL Final  . Hgb urine dipstick 10/18/2012 NEGATIVE  NEGATIVE Final  . Bilirubin Urine 10/18/2012 SMALL* NEGATIVE Final  . Ketones, ur 10/18/2012 NEGATIVE  NEGATIVE mg/dL Final  . Protein, ur 16/08/9603 NEGATIVE  NEGATIVE mg/dL Final  . Urobilinogen, UA 10/18/2012 1.0  0.0 - 1.0 mg/dL Final  . Nitrite 54/07/8118 NEGATIVE  NEGATIVE Final  . Leukocytes, UA 10/18/2012 NEGATIVE  NEGATIVE Final   MICROSCOPIC NOT DONE ON URINES WITH NEGATIVE PROTEIN, BLOOD, LEUKOCYTES, NITRITE, OR GLUCOSE <1000 mg/dL.  . ABO/RH(D) 10/18/2012 AB POS   Final     X-Rays:Dg Chest 2 View  10/18/2012  *RADIOLOGY REPORT*  Clinical  Data: Preoperative respiratory exam.  Severe osteoarthritis of the right hip.  CHEST - 2 VIEW  Comparison: None.  Findings: The heart size and pulmonary vascularity are normal and the lungs are clear.  No acute osseous abnormality.  IMPRESSION: Normal chest.   Original Report Authenticated By: Francene Boyers, M.D.    Dg Hip Complete Right  10/18/2012  *RADIOLOGY REPORT*  Clinical Data: Osteoarthritis of the right hip.  Preoperative exam.  RIGHT HIP - COMPLETE 2+ VIEW  Comparison: Radiographs dated 02/10/2012  Findings: There is severe osteoarthritis of the right hip joint with prominent osteophytes on the acetabulum and femoral head which should severely restrict movement.  There are also degenerative cysts in the femoral head and acetabulum.  The patient has similar severe osteoarthritis of the left hip.  IMPRESSION: Severe osteoarthritis of the right hip.   Original Report Authenticated By: Francene Boyers, M.D.    Dg Pelvis Portable  10/23/2012  *RADIOLOGY REPORT*  Clinical Data: Right hip replacement  PORTABLE PELVIS  Comparison: 10/18/2012  Findings: Right hip replacement in satisfactory position and alignment.  Hardware in good  position.  No fracture or acute complication.  Advanced degenerative change in the left hip  IMPRESSION: Satisfactory right hip replacement.   Original Report Authenticated By: Janeece Riggers, M.D.    Dg Hip Portable 1 View Right  10/23/2012  *RADIOLOGY REPORT*  Clinical Data: Postop hip replacement  PORTABLE RIGHT HIP - 1 VIEW  Comparison: 10/23/2012  Findings: Total hip replacement in satisfactory position and alignment.  No fracture or immediate complication.  IMPRESSION: Satisfactory right hip replacement.   Original Report Authenticated By: Janeece Riggers, M.D.     EKG:No orders found for this or any previous visit.   Hospital Course: Patient was admitted to [facility] and taken to the OR and underwent the above state procedure without complications.  Patient  tolerated the procedure well and was later transferred to the recovery room and then to the orthopaedic floor for postoperative care.  They were given PO and IV analgesics for pain control following their surgery.  They were given 24 hours of postoperative antibiotics of  Anti-infectives     Start     Dose/Rate Route Frequency Ordered Stop   10/23/12 2030   ceFAZolin (ANCEF) IVPB 2 g/50 mL premix        2 g 100 mL/hr over 30 Minutes Intravenous Every 6 hours 10/23/12 1755 10/24/12 0326   10/23/12 1252   ceFAZolin (ANCEF) 3 g in dextrose 5 % 50 mL IVPB        3 g 160 mL/hr over 30 Minutes Intravenous 60 min pre-op 10/23/12 1252 10/23/12 1442         and started on DVT prophylaxis in the form of Xarelto.   PT and OT were ordered for total hip protocol.  The patient was allowed to be WBAT with therapy. Discharge planning was consulted to help with postop disposition and equipment needs.  Patient had a decent night on the evening of surgery and started to get up OOB with therapy on day one walking over 50 feet.  Hemovac drain was pulled without difficulty.  The knee immobilizer was removed and discontinued.  Continued to work with therapy into day two walking about 75 feet.  Dressing was changed on day two and the incision was healing well.   Patient was seen in rounds on day two and was felt to be ready to go home later that same day.  Discharge Medications: Prior to Admission medications   Medication Sig Start Date End Date Taking? Authorizing Provider  allopurinol (ZYLOPRIM) 300 MG tablet Take 300 mg by mouth daily.   Yes Historical Provider, MD  atorvastatin (LIPITOR) 20 MG tablet Take 20 mg by mouth daily before breakfast.    Yes Historical Provider, MD  hydrochlorothiazide (HYDRODIURIL) 25 MG tablet Take 25 mg by mouth daily before breakfast.    Yes Historical Provider, MD  lisinopril (PRINIVIL,ZESTRIL) 5 MG tablet Take 5 mg by mouth daily before breakfast.    Yes Historical Provider, MD    potassium chloride SA (K-DUR,KLOR-CON) 20 MEQ tablet Take 20 mEq by mouth daily.    Yes Historical Provider, MD  methocarbamol (ROBAXIN) 500 MG tablet Take 1 tablet (500 mg total) by mouth every 6 (six) hours as needed. 10/24/12   Alexzandrew Perkins, PA  oxyCODONE (OXY IR/ROXICODONE) 5 MG immediate release tablet Take 1-2 tablets (5-10 mg total) by mouth every 3 (three) hours as needed. 10/24/12   Alexzandrew Julien Girt, PA  rivaroxaban (XARELTO) 10 MG TABS tablet Take 1 tablet (10 mg total) by mouth daily  with breakfast. Take Xarelto for two and a half more weeks, then discontinue Xarelto. 10/24/12   Alexzandrew Julien Girt, PA    Diet: Cardiac diet Activity:WBAT No bending hip over 90 degrees- A "L" Angle Do not cross legs Do not let foot roll inward When turning these patients a pillow should be placed between the patient's legs to prevent crossing. Patients should have the affected knee fully extended when trying to sit or stand from all surfaces to prevent excessive hip flexion. When ambulating and turning toward the affected side the affected leg should have the toes turned out prior to moving the walker and the rest of patient's body as to prevent internal rotation/ turning in of the leg. Abduction pillows are the most effective way to prevent a patient from not crossing legs or turning toes in at rest. If an abduction pillow is not ordered placing a regular pillow length wise between the patient's legs is also an effective reminder. It is imperative that these precautions be maintained so that the surgical hip does not dislocate. Follow-up:in 2 weeks Disposition - Home Discharged Condition: good   Discharge Orders    Future Orders Please Complete By Expires   Diet - low sodium heart healthy      Call MD / Call 911      Comments:   If you experience chest pain or shortness of breath, CALL 911 and be transported to the hospital emergency room.  If you develope a fever above 101 F, pus  (white drainage) or increased drainage or redness at the wound, or calf pain, call your surgeon's office.   Discharge instructions      Comments:   Pick up stool softner and laxative for home. Do not submerge incision under water. May shower. Continue to use ice for pain and swelling from surgery. Hip precautions.  Total Hip Protocol.  Take Xarelto for two and a half more weeks, then discontinue Xarelto.   Constipation Prevention      Comments:   Drink plenty of fluids.  Prune juice may be helpful.  You may use a stool softener, such as Colace (over the counter) 100 mg twice a day.  Use MiraLax (over the counter) for constipation as needed.   Increase activity slowly as tolerated      Patient may shower      Comments:   You may shower without a dressing once there is no drainage.  Do not wash over the wound.  If drainage remains, do not shower until drainage stops.   Weight bearing as tolerated      Driving restrictions      Comments:   No driving until released by the physician.   Lifting restrictions      Comments:   No lifting until released by the physician.   Follow the hip precautions as taught in Physical Therapy      Change dressing      Comments:   You may change your dressing dressing daily with sterile 4 x 4 inch gauze dressing and paper tape.  Do not submerge the incision under water.   TED hose      Comments:   Use stockings (TED hose) for 3 weeks on both leg(s).  You may remove them at night for sleeping.   Do not sit on low chairs, stoools or toilet seats, as it may be difficult to get up from low surfaces          Medication List  As of 10/25/2012  8:42 AM    STOP taking these medications         ibuprofen 200 MG tablet   Commonly known as: ADVIL,MOTRIN      TAKE these medications         allopurinol 300 MG tablet   Commonly known as: ZYLOPRIM   Take 300 mg by mouth daily.      atorvastatin 20 MG tablet   Commonly known as: LIPITOR   Take 20 mg  by mouth daily before breakfast.      hydrochlorothiazide 25 MG tablet   Commonly known as: HYDRODIURIL   Take 25 mg by mouth daily before breakfast.      lisinopril 5 MG tablet   Commonly known as: PRINIVIL,ZESTRIL   Take 5 mg by mouth daily before breakfast.      methocarbamol 500 MG tablet   Commonly known as: ROBAXIN   Take 1 tablet (500 mg total) by mouth every 6 (six) hours as needed.      oxyCODONE 5 MG immediate release tablet   Commonly known as: Oxy IR/ROXICODONE   Take 1-2 tablets (5-10 mg total) by mouth every 3 (three) hours as needed.      potassium chloride SA 20 MEQ tablet   Commonly known as: K-DUR,KLOR-CON   Take 20 mEq by mouth daily.      rivaroxaban 10 MG Tabs tablet   Commonly known as: XARELTO   Take 1 tablet (10 mg total) by mouth daily with breakfast. Take Xarelto for two and a half more weeks, then discontinue Xarelto.           Follow-up Information    Follow up with Loanne Drilling, MD. Schedule an appointment as soon as possible for a visit in 2 weeks.   Contact information:   [address], [address] [place] [address] [phone]          Signed: Patrica Duel 10/25/2012, 8:42 AM

## 2012-10-25 NOTE — Progress Notes (Signed)
Physical Therapy Treatment Patient Details Name: Manuel Torres MRN: [id number] DOB: Jan 02, 1961 Today's Date: 10/25/2012 Time: 4098-1191 PT Time Calculation (min): 13 min  PT Assessment / Plan / Recommendation Comments on Treatment Session  POD # 2 pm session.  Practiced going up down one step backward due to no rails.  Also instructed on proper tech to get in/out car while maintaining THP.  Instructed on proper positioning of pillows with side lying positioning.  Instructed on HEP, frequency and use of ICE after.  Pt plans to D/C to home with spouse today.    Follow Up Recommendations  Home health PT     Does the patient have the potential to tolerate intense rehabilitation     Barriers to Discharge        Equipment Recommendations  Rolling walker with 5" wheels    Recommendations for Other Services    Frequency 7X/week   Plan Discharge plan remains appropriate    Precautions / Restrictions Precautions Precautions: Posterior Hip Precaution Comments: recalled 3/3 thps Restrictions Weight Bearing Restrictions: No Other Position/Activity Restrictions: WBAT   Pertinent Vitals/Pain C/o "soreness/tightness" ICE applied    Mobility  Bed Mobility Bed Mobility: Not assessed Details for Bed Mobility Assistance: Pt OOB in recliner Transfers Transfers: Sit to Stand;Stand to Sit Sit to Stand: 5: Supervision;From chair/3-in-1 Stand to Sit: 5: Supervision;To chair/3-in-1 Details for Transfer Assistance: Increased time and <25% VC's on proper tech to avoid hip flex > 90' Ambulation/Gait Ambulation/Gait Assistance: 5: Supervision Ambulation Distance (Feet): 10 Feet Assistive device: Rolling walker Ambulation/Gait Assistance Details: Decreased amb distance 2nd Tx session focused on negociating one step up backward due to no rails. Gait Pattern: Step-to pattern;Decreased stance time - right;Decreased stride length;Trunk flexed Gait velocity: increased, increased time Stairs:  Yes Stairs Assistance: 4: Min guard Stair Management Technique: No rails;Backwards Number of Stairs: 1     Exercises Total Joint Exercises Ankle Circles/Pumps: AROM;Both;10 reps;Supine Quad Sets: AROM;Both;10 reps;Supine Gluteal Sets: AROM;Both;10 reps;Supine Short Arc Quad: AROM;Right;10 reps;Supine Heel Slides: AAROM;Right;10 reps;Supine Hip ABduction/ADduction: AAROM;Right;10 reps;Supine    PT Goals                                            progressing    Visit Information  Last PT Received On: 10/25/12 Assistance Needed: +1    Subjective Data      Cognition  Overall Cognitive Status: Appears within functional limits for tasks assessed/performed Arousal/Alertness: Awake/alert Orientation Level: Appears intact for tasks assessed Behavior During Session: [facility] for tasks performed    Balance   good with RW  End of Session PT - End of Session Equipment Utilized During Treatment: Gait belt Activity Tolerance: Patient tolerated treatment well Patient left: in chair;with call bell/phone within reach ( to R hip)   Manuel Torres  PTA [facility]  Acute  Rehab Pager     [phone]

## 2012-10-25 NOTE — Care Management Note (Signed)
    Page 1 of 2   10/25/2012     2:28:10 PM   CARE MANAGEMENT NOTE 10/25/2012  Patient:  Manuel Torres, Manuel Torres   Account Number:  [id number]  Date Initiated:  10/24/2012  Documentation initiated by:  Colleen Can  Subjective/Objective Assessment:   dx osteoarthritis right hip; total hip replacemnt on day of admission     Action/Plan:   Pt plans to go home with [facility] services. Spouse will be caregiver.   Anticipated DC Date:  10/26/2012   Anticipated DC Plan:  HOME W HOME HEALTH SERVICES  In-house referral  NA      DC Planning Services  CM consult      [facility] Choice  HOME HEALTH   Choice offered to / List presented to:  C-1 Patient   DME arranged  3-N-1  Levan Hurst      DME agency  [facility]     HH arranged  HH-2 PT      [facility] agency  [facility]   Status of service:  Completed, signed off Medicare Important Message given?  NO (If response is "NO", the following Medicare IM given date fields will be blank) Date Medicare IM given:   Date Additional Medicare IM given:    Discharge Disposition:  HOME W HOME HEALTH SERVICES  Per UR Regulation:  Reviewed for med. necessity/level of care/duration of stay  If discussed at Long Length of Stay Meetings, dates discussed:    Comments:  10/25/2012 [facility] Estreya Clay BSN RN CCM [phone] DME HAS BEEN DELIVERED TO PATIENT'S ROOM. [facility] WILL PROVIDE hh SERVICES WITH START DATE 48HRS WITHIN DISCHARGE FROM HOSPITAL. CONTACT INFO GIVEN TO PATIENT.

## 2012-10-25 NOTE — Progress Notes (Signed)
Physical Therapy Treatment Patient Details Name: Manuel Torres MRN: [id number] DOB: 1961/03/05 Today's Date: 10/25/2012 Time: 4098-1191 PT Time Calculation (min): 28 min  PT Assessment / Plan / Recommendation Comments on Treatment Session  POD # 2 am session R Posterior THR with plans to D/C to home afetr 2 sessions. Amb pt in hallway then performed THR TE's, given handout.    Follow Up Recommendations  Home health PT     Does the patient have the potential to tolerate intense rehabilitation     Barriers to Discharge        Equipment Recommendations  Rolling walker with 5" wheels    Recommendations for Other Services    Frequency 7X/week   Plan Discharge plan remains appropriate    Precautions / Restrictions Precautions Precautions: Posterior Hip Precaution Comments: pt recalls 2/3 THP so re educated Restrictions Weight Bearing Restrictions: No Other Position/Activity Restrictions: WBAT   Pertinent Vitals/Pain C/o 5/10 during TE's ICE applied     Mobility  Bed Mobility Bed Mobility: Not assessed Details for Bed Mobility Assistance: Pt OOB in recliner Transfers Transfers: Sit to Stand;Stand to Sit Sit to Stand: 5: Supervision;4: Min guard;From chair/3-in-1 Stand to Sit: 5: Supervision;4: Min guard;To chair/3-in-1 Details for Transfer Assistance: 25% VC's on proper tech, hand placement and how to avoid hip flex > 90' Ambulation/Gait Ambulation/Gait Assistance: 4: Min guard Ambulation Distance (Feet): 75 Feet Assistive device: Rolling walker Ambulation/Gait Assistance Details: 25% VC's on proper tech with turns and backward gait sequencing.  Pt required increased. Gait Pattern: Step-to pattern;Decreased stance time - right;Decreased stride length;Decreased stance time - left;Trunk flexed Gait velocity: increased, increased time    Exercises Total Joint Exercises Ankle Circles/Pumps: AROM;Both;10 reps;Supine Quad Sets: AROM;Both;10 reps;Supine Gluteal Sets:  AROM;Both;10 reps;Supine Short Arc Quad: AROM;Right;10 reps;Supine Heel Slides: AAROM;Right;10 reps;Supine Hip ABduction/ADduction: AAROM;Right;10 reps;Supine    PT Goals                                          progressing    Visit Information  Last PT Received On: 10/25/12 Assistance Needed: +1    Subjective Data      Cognition       Balance     End of Session PT - End of Session Equipment Utilized During Treatment: Gait belt Activity Tolerance: Patient tolerated treatment well Patient left: in chair;with call bell/phone within reach;with family/visitor present (ICE to R hip)   Felecia Shelling  PTA [facility]  Acute  Rehab Pager     [phone]

## 2012-10-25 NOTE — Progress Notes (Signed)
   Subjective: 2 Days Post-Op Procedure(s) (LRB): TOTAL HIP ARTHROPLASTY (Right) Patient reports pain as mild.   Patient seen in rounds for Dr. Lequita Halt. Wife in room at bedside. Patient is well, and has had no acute complaints or problems Patient is ready to go home today.  Objective: Vital signs in last 24 hours: Temp:  [97.2 F (36.2 C)-99 F (37.2 C)] 97.2 F (36.2 C) (12/11 0659) Pulse Rate:  [77-104] 85  (12/11 0659) Resp:  [16-18] 16  (12/11 0659) BP: (137-165)/(73-88) 146/82 mmHg (12/11 0659) SpO2:  [95 %-98 %] 97 % (12/11 0659)  Intake/Output from previous day:  Intake/Output Summary (Last 24 hours) at 10/25/12 0841 Last data filed at 10/25/12 0700  Gross per 24 hour  Intake    720 ml  Output   1900 ml  Net  -1180 ml    Intake/Output this shift:    Labs:  Basename 10/25/12 0445 10/24/12 0455  HGB 13.2 13.4    Basename 10/25/12 0445 10/24/12 0455  WBC 12.9* 6.6  RBC 4.18* 4.29  HCT 37.4* 39.1  PLT 313 297    Basename 10/25/12 0445 10/24/12 0455  NA 131* 133*  K 4.0 4.1  CL 95* 98  CO2 26 27  BUN 9 9  CREATININE 0.82 0.84  GLUCOSE 189* 161*  CALCIUM 9.2 8.5   No results found for this basename: LABPT:2,INR:2 in the last 72 hours  EXAM: General - Patient is Alert, Appropriate and Oriented Extremity - Neurovascular intact Sensation intact distally Dorsiflexion/Plantar flexion intact No cellulitis present Incision - clean, dry, no drainage, healing Motor Function - intact, moving foot and toes well on exam.   Assessment/Plan: 2 Days Post-Op Procedure(s) (LRB): TOTAL HIP ARTHROPLASTY (Right) Procedure(s) (LRB): TOTAL HIP ARTHROPLASTY (Right) Past Medical History  Diagnosis Date  . Hypertension   . Hypercholesteremia   . Arthritis   . Gout     NO RECENT FLARE UPS   Principal Problem:  *OA (osteoarthritis) of hip Active Problems:  Postop Hyponatremia  Estimated Body mass index is 38.22 kg/(m^2) as calculated from the following:  Height as of this encounter: 5\' 11" (1.803 m).   Weight as of this encounter: 274 lb(124.286 kg). Up with therapy Discharge home with home health Diet - Cardiac diet Follow up - in 2 weeks Activity - WBAT Disposition - Home Condition Upon Discharge - Good D/C Meds - See DC Summary DVT Prophylaxis - Xarelto  Catheline Hixon 10/25/2012, 8:41 AM

## 2012-10-25 NOTE — Progress Notes (Signed)
Occupational Therapy Treatment Patient Details Name: Manuel Torres MRN: [id number] DOB: [birth year] Today's Date: 10/25/2012 Time: 7829-5621 OT Time Calculation (min): 34 min  OT Assessment / Plan / Recommendation Comments on Treatment Session      Follow Up Recommendations  No OT follow up    Barriers to Discharge       Equipment Recommendations  3 in 1 bedside comode    Recommendations for Other Services    Frequency Min 2X/week   Plan      Precautions / Restrictions Precautions Precautions: Posterior Hip Precaution Comments: recalled 3/3 thps Restrictions Weight Bearing Restrictions: No Other Position/Activity Restrictions: WBAT   Pertinent Vitals/Pain Sore only-- right hip    ADL  Lower Body Dressing: Performed;Minimal assistance (for shoes:  shoehorn too short.  Min cues ) Where Assessed - Lower Body Dressing: Supported sit to stand Toilet Transfer: Research scientist (life sciences) Method: Sit to Barista: Bedside commode (ambulated into bathroom) Transfers/Ambulation Related to ADLs: Pt stood at toilet to void.  Sat on 3:1 for LB dressing ADL Comments: Pt recalled modification with sock aid.  He states he has been using this.  States he hadn't used Sports administrator for dressing, so worked through this.      OT Diagnosis:    OT Problem List:   OT Treatment Interventions:     OT Goals ADL Goals Pt Will Transfer to Toilet: with supervision;Ambulation;3-in-1 ADL Goal: Toilet Transfer - Progress: Met Pt Will Perform Toileting - Hygiene: with supervision;Standing at 3-in-1/toilet ADL Goal: Toileting - Hygiene - Progress: Met Miscellaneous OT Goals Miscellaneous OT Goal #1: Pt will recall 3/3 thps and verbalize tossing sock aid to the right of right foot to follow precaution during dressing OT Goal: Miscellaneous Goal #1 - Progress: Met Miscellaneous OT Goal #2: Pt will complete LB adls with supervision, using ae and no vcs OT Goal:  Miscellaneous Goal #2 - Progress: Goal set today  Visit Information  Last OT Received On: 10/25/12 Assistance Needed: +1    Subjective Data      Prior Functioning       Cognition  Overall Cognitive Status: Appears within functional limits for tasks assessed/performed Arousal/Alertness: Awake/alert Orientation Level: Appears intact for tasks assessed Behavior During Session: [name] for tasks performed    Mobility  Shoulder Instructions  Transfers Sit to Stand: 5: Supervision;With armrests;From chair/3-in-1 Stand to Sit: 5: Supervision;4: Min guard;To chair/3-in-1 Details for Transfer Assistance: one vc needed       Exercises     Balance     End of Session OT - End of Session Activity Tolerance: Patient tolerated treatment well Patient left: in chair;with call bell/phone within reach Nurse Communication: Other (comment) (second ice bag)  GO     Manuel Torres 10/25/2012, 11:51 AM [name], OTR/L [phone] 10/25/2012

## 2012-11-14 ENCOUNTER — Ambulatory Visit (HOSPITAL_COMMUNITY)
Admission: RE | Admit: 2012-11-14 | Discharge: 2012-11-14 | Disposition: A | Discharge status: Home / Self Care | Payer: 59 | Source: Ambulatory Visit | Attending: Orthopedic Surgery | Admitting: Orthopedic Surgery

## 2012-11-14 DIAGNOSIS — R29898 Other symptoms and signs involving the musculoskeletal system: Secondary | ICD-10-CM | POA: Insufficient documentation

## 2012-11-14 DIAGNOSIS — R262 Difficulty in walking, not elsewhere classified: Secondary | ICD-10-CM | POA: Insufficient documentation

## 2012-11-14 DIAGNOSIS — IMO0001 Reserved for inherently not codable concepts without codable children: Secondary | ICD-10-CM | POA: Insufficient documentation

## 2012-11-14 DIAGNOSIS — M25659 Stiffness of unspecified hip, not elsewhere classified: Secondary | ICD-10-CM | POA: Insufficient documentation

## 2012-11-14 DIAGNOSIS — M6281 Muscle weakness (generalized): Secondary | ICD-10-CM | POA: Insufficient documentation

## 2012-11-14 DIAGNOSIS — M25559 Pain in unspecified hip: Secondary | ICD-10-CM | POA: Insufficient documentation

## 2012-11-14 NOTE — Evaluation (Signed)
Physical Therapy Evaluation  Patient Details  Name: Manuel Torres MRN: [id number] Date of Birth: 1961-10-03  Today's Date: 11/14/2012 Time: 0805-0853 PT Time Calculation (min): 48 min  Visit#: 1  of 8   Re-eval: 12/14/12 Assessment Diagnosis: R THR Surgical Date: 10/23/12 Next MD Visit: 11/28/2012 Prior Therapy: HH  Authorization: [employer]  Past Medical History:  Past Medical History  Diagnosis Date  . Hypertension   . Hypercholesteremia   . Arthritis   . Gout     NO RECENT FLARE UPS   Past Surgical History:  Past Surgical History  Procedure Date  . Right inguinal hernia   . Colonoscopy 08/03/2012    Procedure: COLONOSCOPY;  Surgeon: Malissa Hippo, MD;  Location: [facility];  Service: Endoscopy;  Laterality: N/A;  830  . Total hip arthroplasty 10/23/2012    Procedure: TOTAL HIP ARTHROPLASTY;  Surgeon: Loanne Drilling, MD;  Location: [facility] OR[facility];  Service: Orthopedics;  Laterality: Right;    Subjective Symptoms/Limitations Symptoms: Manuel Torres underwent a total right hip replacement, (posterior) on 10/23/12.  He was discharged from the hospital on 12/11 and has been recieving HH therapy.  He is now referred to out-patient therapy to maximize his functional capacity.  He is able to tell me his hip precautions.  He is currently ambulating with a cane and is no longe taking pain medication.  Pertinent History: Needs L THR next year. How long can you sit comfortably?: No problem How long can you stand comfortably?: 15-20 minutes How long can you walk comfortably?: with a cane for 5-10 minutes. Special Tests: sleeping 2-3 hours at a time.  Stair climbing not able to do reciprocally;  Patient Stated Goals: To be able to get back to my normal life. Pain Assessment Currently in Pain?: Yes Pain Score:   5 (highest 7; lowest 0) Pain Location: Hip Pain Orientation: Right  Precautions/Restrictions  Precautions Precautions: Posterior Hip   Assessment RLE  Strength Right Hip Flexion: 2/5 Right Hip Extension: 4/5 Right Hip ABduction: 2/5 Right Knee Flexion: 3/5 Right Knee Extension: 2/5 Right Ankle Dorsiflexion: 4/5 Right Ankle Plantar Flexion: 3-/5  Exercise/Treatments Mobility/Balance  Static Standing Balance Single Leg Stance - Right Leg: 2  Single Leg Stance - Left Leg: 12     Standing Heel Raises: 10 reps Functional Squat: 10 reps Seated Long Arc Quad: 10 reps Supine Heel Slides: 10 reps Straight Leg Raises: 5 reps;Limitations Straight Leg Raises Limitations: knee bent to 40 degrees Other Supine Knee Exercises: ankle DF/PF x 10 Other Supine Knee Exercises: hip abduction x 10  Physical Therapy Assessment and Plan PT Assessment and Plan Clinical Impression Statement: Pt s/p R THR who presents with decreased strength/ROM causing limitied mobility who will benefit from skilled therapy to return to prior functional level. Pt will benefit from skilled therapeutic intervention in order to improve on the following deficits: Decreased activity tolerance;Decreased balance;Decreased mobility;Decreased range of motion;Decreased strength;Difficulty walking;Pain Rehab Potential: Excellent PT Frequency: Min 2X/week PT Duration: 4 weeks PT Treatment/Interventions: Gait training;Stair training;Therapeutic exercise;Patient/family education;Modalities;Manual techniques PT Plan: begin gait trainer, SLS, rockerboard, lateral and forward step ups, prone hip extension    Goals Home Exercise Program Pt will Perform Home Exercise Program: Independently PT Goal: Perform Home Exercise Program - Progress: Goal set today PT Short Term Goals Time to Complete Short Term Goals: 2 weeks PT Short Term Goal 1: Pt to be able to stand for 30 minutes PT Short Term Goal 2: Pt to be able to [employer]  for 30 minutes  PT Short Term Goal 3: Pt to be able to sleep for 4 hours straight PT Long Term Goals PT Long Term Goal 1: Strength to be improved one grade to be  able to go up and down steps reciprocally PT Long Term Goal 2: Pt to be able to walk without a cane  Long Term Goal 3: Pt to be able to sleep throughout the night. Long Term Goal 4: Pt to be able to squat and pick up 20 lb. without difficulty  Problem List Patient Active Problem List  Diagnosis  . NONSPEC ELEVATION OF LEVELS OF TRANSAMINASE/LDH  . OA (osteoarthritis) of hip  . Postop Hyponatremia  . Difficulty in walking  . Weakness of right leg    PT - End of Session Activity Tolerance: Patient tolerated treatment well General Behavior During Session: [facility] for tasks performed Cognition: [facility] for tasks performed PT Plan of Care PT Home Exercise Plan: given  GP    RUSSELL,CINDY 11/14/2012, 2:55 PM  Physician Documentation Your signature is required to indicate approval of the treatment plan as stated above.  Please sign and either send electronically or make a copy of this report for your files and return this physician signed original.   Please mark one 1._x_approve of plan  2. ___approve of plan with the following conditions.   ______________________________                                                          _____________________ Physician Signature                                                                                                             Date

## 2012-11-16 ENCOUNTER — Ambulatory Visit (HOSPITAL_COMMUNITY): Payer: 59 | Admitting: Physical Therapy

## 2012-11-21 ENCOUNTER — Ambulatory Visit (HOSPITAL_COMMUNITY)
Admission: RE | Admit: 2012-11-21 | Discharge: 2012-11-21 | Disposition: A | Discharge status: Home / Self Care | Payer: 59 | Source: Ambulatory Visit | Attending: Orthopedic Surgery | Admitting: Orthopedic Surgery

## 2012-11-21 DIAGNOSIS — M6281 Muscle weakness (generalized): Secondary | ICD-10-CM | POA: Insufficient documentation

## 2012-11-21 DIAGNOSIS — R262 Difficulty in walking, not elsewhere classified: Secondary | ICD-10-CM | POA: Insufficient documentation

## 2012-11-21 DIAGNOSIS — M25659 Stiffness of unspecified hip, not elsewhere classified: Secondary | ICD-10-CM | POA: Insufficient documentation

## 2012-11-21 DIAGNOSIS — M25559 Pain in unspecified hip: Secondary | ICD-10-CM | POA: Insufficient documentation

## 2012-11-21 DIAGNOSIS — R29898 Other symptoms and signs involving the musculoskeletal system: Secondary | ICD-10-CM

## 2012-11-21 DIAGNOSIS — IMO0001 Reserved for inherently not codable concepts without codable children: Secondary | ICD-10-CM | POA: Insufficient documentation

## 2012-11-21 NOTE — Progress Notes (Signed)
Physical Therapy Treatment Patient Details  Name: Manuel Torres MRN: [id number] Date of Birth: Jun 22, 1961  Today's Date: 11/21/2012 Time: 0937-1020 PT Time Calculation (min): 43 min  Visit#: 2  of 8   Re-eval: 12/14/12    Authorization: [employer] [facility]  Charge: Gt x 8; There ex x 28  Subjective: Symptoms/Limitations Symptoms: Pt states his L knee is bothering him more than his hip Pain Assessment Currently in Pain?: No/denies Pain Score: 0-No pain Pain Location: Hip  Precautions/Restrictions   Posterior total hip  Exercise/Treatments   Aerobic Tread Mill: gt trainer LL 93; 6:00   Standing Heel Raises: 15 reps Lateral Step Up: Right;10 reps Forward Step Up: 10 reps Functional Squat: 15 reps Rocker Board: 2 minutes SLS: 5x 20 sec max. Seated Long Arc Quad: 10 reps;Weights Long Arc Quad Weight: 3 lbs. Supine Heel Slides: 10 reps Straight Leg Raises: 5 reps;10 reps Sidelying Hip ABduction: 10 reps;Limitations Hip ABduction Limitations: leg bent to 30 degrees  Prone  Hip Extension: 10 reps   Physical Therapy Assessment and Plan PT Assessment and Plan Clinical Impression Statement: Pt with good form after verbal cuing for new exercises.   PT Plan: begin steps in the department next treatment      Problem List Patient Active Problem List  Diagnosis  . NONSPEC ELEVATION OF LEVELS OF TRANSAMINASE/LDH  . OA (osteoarthritis) of hip  . Postop Hyponatremia  . Difficulty in walking  . Weakness of right leg    PT - End of Session Activity Tolerance: Patient tolerated treatment well PT Plan of Care PT Home Exercise Plan: given  GP    RUSSELL,CINDY 11/21/2012, 12:14 PM

## 2012-11-23 ENCOUNTER — Ambulatory Visit (HOSPITAL_COMMUNITY)
Admission: RE | Admit: 2012-11-23 | Discharge: 2012-11-23 | Disposition: A | Discharge status: Home / Self Care | Payer: 59 | Source: Ambulatory Visit | Attending: Orthopedic Surgery | Admitting: Orthopedic Surgery

## 2012-11-23 NOTE — Progress Notes (Signed)
Physical Therapy Treatment Patient Details  Name: Manuel Torres MRN: [id number] Date of Birth: 09-May-1961  Today's Date: 11/23/2012 Time: 0932-1020 PT Time Calculation (min): 48 min  Visit#: 3  of 8   Re-eval: 12/14/12 Charges:  therex 45'  Subjective: Symptoms/Limitations Symptoms: Pt. states he's doing OK today, just having a little discomfort in his hip but no real pain. Pain Assessment Currently in Pain?: No/denies   Exercise/Treatments Aerobic Tread Mill: gt trainer LL 93; 8' at 0.5 cyc/sec Standing Heel Raises: 15 reps;Limitations Heel Raises Limitations: toeraises 15 reps Lateral Step Up: Right;15 reps;Step Height: 4";Hand Hold: 1 Forward Step Up: Right;15 reps;Step Height: 4";Hand Hold: 1 Step Down: Right;10 reps;Step Height: 4";Hand Hold: 1 Functional Squat: 15 reps Rocker Board: 2 minutes SLS with Vectors: 5X5" R only Other Standing Knee Exercises: hip abduction, extension R LE 10 reps Supine Bridges: 10 reps Straight Leg Raises: 10 reps;Right Sidelying Hip ABduction: 15 reps Hip ABduction Limitations: leg bent to 30 degrees  Clams: 10 reps R LE      Physical Therapy Assessment and Plan PT Assessment and Plan Clinical Impression Statement: Pt. with noted eccentric weakness with addition of forward step downs.  Pt. requires postural cues during all standing exericises.  Added hip extension and abduction in standing as well as SL clams and supine bridge to increase hip strength and stability on Right.  Continues to present with antalgic gait when ambulating without SPC.  Improving gait using biofeedback on treadmill.   PT Plan: Continue to progress per PT POC; progress to stairs when increased eccentric strength of R LE.     Problem List Patient Active Problem List  Diagnosis  . NONSPEC ELEVATION OF LEVELS OF TRANSAMINASE/LDH  . OA (osteoarthritis) of hip  . Postop Hyponatremia  . Difficulty in walking  . Weakness of right leg    PT - End of  Session Activity Tolerance: Patient tolerated treatment well PT Plan of Care PT Home Exercise Plan: given   Lurena Nida, PTA/CLT 11/23/2012, 11:43 AM

## 2012-11-28 ENCOUNTER — Ambulatory Visit (HOSPITAL_COMMUNITY)
Admission: RE | Admit: 2012-11-28 | Discharge: 2012-11-28 | Disposition: A | Discharge status: Home / Self Care | Payer: 59 | Source: Ambulatory Visit | Attending: Orthopedic Surgery | Admitting: Orthopedic Surgery

## 2012-11-28 NOTE — Progress Notes (Signed)
Physical Therapy Treatment Patient Details  Name: Manuel Torres MRN: [id number] Date of Birth: 06/11/1961  Today's Date: 11/28/2012 Time: 0932-1012 PT Time Calculation (min): 40 min  Visit#: 4  of 8   Re-eval: 12/14/12 Charges: Gait x 8' Therex x 30'   Subjective: Symptoms/Limitations Symptoms: Pt states that he is having some soreness in his hip from trying to walk without the cane. Pain Assessment Currently in Pain?: Yes Pain Score:   2 Pain Location: Hip Pain Orientation: Right   Exercise/Treatments Aerobic Tread Mill: gt trainer LL 93; 8' at 0.5 cyc/sec Standing Heel Raises: 15 reps;Limitations Heel Raises Limitations: Toe raises 15 reps Lateral Step Up: Right;15 reps;Step Height: 4";Hand Hold: 1 Forward Step Up: Right;15 reps;Step Height: 4";Hand Hold: 1 Step Down: 15 reps;Right;Hand Hold: 1;Step Height: 4" Functional Squat: 15 reps Rocker Board: 2 minutes SLS with Vectors: 5X5" R only Other Standing Knee Exercises: hip abduction, extension R LE 10 reps      Physical Therapy Assessment and Plan PT Assessment and Plan Clinical Impression Statement: Pt completes standing therex well with minimal need for cueing. Pt requires multimodal cueing to avoid dropping hip with lateral step ups. Pt also requires vc's to avoid hip ER wit standing abduction to compensate for abductor weakness. Pt reports decreased soreness at end of session. PT Plan: Continue to progress per PT POC; progress to stairs when increased eccentric strength of R LE.     Problem List Patient Active Problem List  Diagnosis  . NONSPEC ELEVATION OF LEVELS OF TRANSAMINASE/LDH  . OA (osteoarthritis) of hip  . Postop Hyponatremia  . Difficulty in walking  . Weakness of right leg    PT - End of Session Activity Tolerance: Patient tolerated treatment well General Behavior During Session: [facility] for tasks performed Cognition: [facility] for tasks performed  Seth Bake, PTA 11/28/2012, 10:13 AM

## 2012-11-30 ENCOUNTER — Ambulatory Visit (HOSPITAL_COMMUNITY)
Admission: RE | Admit: 2012-11-30 | Discharge: 2012-11-30 | Disposition: A | Discharge status: Home / Self Care | Payer: 59 | Source: Ambulatory Visit | Attending: Orthopedic Surgery | Admitting: Orthopedic Surgery

## 2012-11-30 NOTE — Progress Notes (Signed)
Physical Therapy Treatment Patient Details  Name: Manuel Torres MRN: [id number] Date of Birth: October 09, 1961  Today's Date: 11/30/2012 Time: 0932-1012 PT Time Calculation (min): 40 min  Visit#: 5  of 8   Re-eval: 12/14/12 Charges: Gait x 8' Therex x 30'   Subjective: Symptoms/Limitations Symptoms: Pt states that he is trying to walk more without the cane. Pain Assessment Currently in Pain?: No/denies   Exercise/Treatments Aerobic Tread Mill: gt trainer LL 93; 8' at 0.5 cyc/sec Machines for Strengthening Cybex Knee Extension: 1pl x 15 RLE only Standing Heel Raises: 20 reps Heel Raises Limitations: Toe raises 20 reps Lateral Step Up: 20 reps;Right;Hand Hold: 1;Step Height: 4" Forward Step Up: 20 reps;Right;Hand Hold: 1;Step Height: 4" Step Down: 20 reps;Step Height: 4";Hand Hold: 1;Right Functional Squat: 20 reps Stairs: 1RT recip w/HR Rocker Board: 2 minutes SLS with Vectors: 5X5" R only Other Standing Knee Exercises: hip abduction, extension R LE 15 reps   Physical Therapy Assessment and Plan PT Assessment and Plan Clinical Impression Statement: Pt completes therex well with minimal need for cueing. Began stair training with most difficulty ascending secondary to decrease power in R LE. Also began cybex quad extension to improve quad strength. Pt reports 0/10 pain at end of session. PT Plan: Continue to progress per PT POC. Focus on improving hip strength and quad power.     Problem List Patient Active Problem List  Diagnosis  . NONSPEC ELEVATION OF LEVELS OF TRANSAMINASE/LDH  . OA (osteoarthritis) of hip  . Postop Hyponatremia  . Difficulty in walking  . Weakness of right leg    PT - End of Session Activity Tolerance: Patient tolerated treatment well General Behavior During Session: [facility] for tasks performed Cognition: [facility] for tasks performed  Seth Bake, PTA 11/30/2012, 11:40 AM

## 2012-12-05 ENCOUNTER — Ambulatory Visit (HOSPITAL_COMMUNITY)
Admission: RE | Admit: 2012-12-05 | Discharge: 2012-12-05 | Disposition: A | Discharge status: Home / Self Care | Payer: 59 | Source: Ambulatory Visit | Attending: Orthopedic Surgery | Admitting: Orthopedic Surgery

## 2012-12-05 NOTE — Progress Notes (Signed)
Physical Therapy Treatment Patient Details  Name: Manuel Torres MRN: [id number] Date of Birth: 08-Jun-1961  Today's Date: 12/05/2012 Time: 0932-1015 PT Time Calculation (min): 43 min  Visit#: 6  of 8   Re-eval: 12/14/12 Charges: Gait x 10' Therex x 30'   Subjective: Symptoms/Limitations Symptoms: Pt states that his hip gets stiff and sore when he sits for any length of time. Pain Assessment Currently in Pain?: Yes Pain Score:   2 Pain Location: Hip Pain Orientation: Right   Exercise/Treatments Aerobic Tread Mill: gt trainer LL 93; 10' at 0.5 cyc/sec Machines for Strengthening Cybex Knee Extension: 1pl 2x15 RLE only Standing Heel Raises: 20 reps Heel Raises Limitations: Toe raises 20 reps Lateral Step Up: 20 reps;Right;Hand Hold: 1;Step Height: 4" Forward Step Up: 20 reps;Right;Hand Hold: 1;Step Height: 6" Step Down: 20 reps;Step Height: 4";Hand Hold: 1;Right Wall Squat: 10 reps;5 seconds Stairs: 1RT recip w/HR Rocker Board: 2 minutes SLS with Vectors: 5X5" R only  Physical Therapy Assessment and Plan PT Assessment and Plan Clinical Impression Statement: Pt completes stairs with improved power and less assistance from UE's. Increased reps on Cybex without difficulty. Began wall squats to improve quad strength/stability. Pt also presents with improved gait mechanics. Pt reports no change in pain throughout session. PT Plan: Continue to progress per PT POC. Begin sidestepping and retro gait with tband next session.     Problem List Patient Active Problem List  Diagnosis  . NONSPEC ELEVATION OF LEVELS OF TRANSAMINASE/LDH  . OA (osteoarthritis) of hip  . Postop Hyponatremia  . Difficulty in walking  . Weakness of right leg    PT - End of Session Activity Tolerance: Patient tolerated treatment well General Behavior During Session: [facility] for tasks performed Cognition: [facility] for tasks performed   Seth Bake, PTA 12/05/2012, 10:17 AM

## 2012-12-07 ENCOUNTER — Ambulatory Visit (HOSPITAL_COMMUNITY)
Admission: RE | Admit: 2012-12-07 | Discharge: 2012-12-07 | Disposition: A | Discharge status: Home / Self Care | Payer: 59 | Source: Ambulatory Visit | Attending: Orthopedic Surgery | Admitting: Orthopedic Surgery

## 2012-12-07 DIAGNOSIS — R262 Difficulty in walking, not elsewhere classified: Secondary | ICD-10-CM

## 2012-12-07 DIAGNOSIS — R29898 Other symptoms and signs involving the musculoskeletal system: Secondary | ICD-10-CM

## 2012-12-07 NOTE — Progress Notes (Signed)
Physical Therapy Treatment Patient Details  Name: Manuel Torres MRN: [id number] Date of Birth: [birth year]  Today's Date: 12/07/2012 Time: 0930-1016 PT Time Calculation (min): 46 min  Visit#: 7  of 8   Re-eval: 12/12/12  charge:  Gt x 10; There ex x 33  Authorization: [employer]  Subjective: Symptoms/Limitations Symptoms: Manuel Torres states that his bottom is sore from the new exercise. Pain Assessment Currently in Pain?: Yes Pain Score:   2 Pain Location: Hip Pain Orientation: Right  Exercise/Treatments   Aerobic Tread Mill: gt trainer LL 93; 10' at 0.5 cyc/sec Machines for Strengthening Cybex Knee Extension: 2 Pl 2 x 10   Standing Heel Raises: 10 reps;Limitations Heel Raises Limitations: R only Lateral Step Up: Right;10 reps;Hand Hold: 2;Step Height: 6" Forward Step Up: Right;10 reps;Hand Hold: 2;Step Height: 6" Wall Squat: 10 reps;5 seconds Stairs: 1RT recip w/HR Rocker Board: 2 minutes SLS with Vectors: 3 x 10"" R only Other Standing Knee Exercises: steps in stairwell x 2   Physical Therapy Assessment and Plan PT Assessment and Plan Clinical Impression Statement: Pt needs verbal cuing to maintain correct posture during exercises.  Began side step and retro w/ t-band. Counseled to take larger steps as pt is ambulating with short fast strides  instead of longer slower strides. Rehab Potential: Good PT Plan: Reassess next treatment.        Problem List Patient Active Problem List  Diagnosis  . NONSPEC ELEVATION OF LEVELS OF TRANSAMINASE/LDH  . OA (osteoarthritis) of hip  . Postop Hyponatremia  . Difficulty in walking  . Weakness of right leg    PT - End of Session Equipment Utilized During Treatment: Gait belt Activity Tolerance: Patient tolerated treatment well General Behavior During Session: Manuel Torres for tasks performed Cognition: Manuel Torres for tasks performed  Manuel Torres    Manuel Torres 12/07/2012, 10:17 AM

## 2012-12-12 ENCOUNTER — Ambulatory Visit (HOSPITAL_COMMUNITY)
Admission: RE | Admit: 2012-12-12 | Discharge: 2012-12-12 | Disposition: A | Discharge status: Home / Self Care | Payer: 59 | Source: Ambulatory Visit | Attending: Orthopedic Surgery | Admitting: Orthopedic Surgery

## 2012-12-12 NOTE — Progress Notes (Signed)
Physical Therapy Reassessment Patient Details  Name: Manuel Torres MRN: [id number] Date of Birth: 03-30-1961  Today's Date: 12/12/2012 Time: 9147-8295 PT Time Calculation (min): 39 min  Visit#: 8  of 8   Re-eval: 12/12/12 Charges: MMT x 1 Self care x 15' Therex x 10'  Authorization: [employer]   uthorization Visit#:   of    Past Medical History:  Past Medical History  Diagnosis Date  . Hypertension   . Hypercholesteremia   . Arthritis   . Gout     NO RECENT FLARE UPS   Past Surgical History:  Past Surgical History  Procedure Date  . Right inguinal hernia   . Colonoscopy 08/03/2012    Procedure: COLONOSCOPY;  Surgeon: Malissa Hippo, MD;  [facility];  Service: Endoscopy;  Laterality: N/A;  830  . Total hip arthroplasty 10/23/2012    Procedure: TOTAL HIP ARTHROPLASTY;  Surgeon: Loanne Drilling, MD;  Location: [facility];  Service: Orthopedics;  Laterality: Right;    Subjective Symptoms/Limitations Symptoms: No pain, mostly stiffness. Pain Assessment Currently in Pain?: No/denies  Precautions/Restrictions  Posterior THR  Assessment RLE Strength Right Hip Flexion:  (4+/5 was 2/5) Right Hip Extension: 4/5 (was 4/5) Right Hip ABduction: 4/5 (was 2/5) Right Knee Flexion:  (4+/5 was 3/5) Right Knee Extension: 5/5 (was 2/5) Right Ankle Dorsiflexion: 5/5 (was 4/5) Right Ankle Plantar Flexion: 5/5 (3-/5)  Exercise/Treatments Machines for Strengthening Cybex Knee Extension: 2 Pl 2 x 15 Standing Stairs: 2RT recip w/1HR  Physical Therapy Assessment and Plan PT Assessment and Plan Clinical Impression Statement: Pt is progressing well. Pt presents with improved over all LE strength. Pt is limited in functional strength and power. Pt also continues to present with gait deviations due to weakness. Pt would benefit from continuing skilled PT to improve upon remaining deficits. PT Plan: Recommend to continue PT x 4 wks to improve remaining deficits.     Goals Home Exercise Program Pt will Perform Home Exercise Program: Independently PT Short Term Goals Time to Complete Short Term Goals: 2 weeks PT Short Term Goal 1: Pt to be able to stand for 30 minutes PT Short Term Goal 1 - Progress: Met PT Short Term Goal 2: Pt to be able to amulate for 30 minutes  PT Short Term Goal 2 - Progress: Progressing toward goal PT Short Term Goal 3: Pt to be able to sleep for 4 hours straight PT Short Term Goal 3 - Progress: Met PT Long Term Goals PT Long Term Goal 1: Strength to be improved one grade to be able to go up and down steps reciprocally PT Long Term Goal 2: Pt to be able to walk without a cane  PT Long Term Goal 2 - Progress: Met Long Term Goal 3: Pt to be able to sleep throughout the night. Long Term Goal 3 Progress: Met Long Term Goal 4: Pt to be able to squat and pick up 20 lb. without difficulty  Problem List Patient Active Problem List  Diagnosis  . NONSPEC ELEVATION OF LEVELS OF TRANSAMINASE/LDH  . OA (osteoarthritis) of hip  . Postop Hyponatremia  . Difficulty in walking  . Weakness of right leg    PT - End of Session Equipment Utilized During Treatment: Gait belt Activity Tolerance: Patient tolerated treatment well General Behavior During Session: [facility] for tasks performed Cognition: [facility] for tasks performed  Seth Bake, PTA/Cindy russell PT 12/12/2012, 10:27 AM  Physician Documentation Your signature is required to indicate approval of  the treatment plan as stated above.  Please sign and either send electronically or make a copy of this report for your files and return this physician signed original.   Please mark one 1.__approve of plan  2. ___approve of plan with the following conditions.   ______________________________                                                          _____________________ Physician Signature                                                                                                              Date

## 2012-12-12 NOTE — Evaluation (Deleted)
Physical Therapy Evaluation  Patient Details  Name: Manuel Torres MRN: [id number] Date of Birth: 06/10/1961  Today's Date: 12/12/2012 Time: 1191-4782 PT Time Calculation (min): 39 min  Visit#: 8  of 8   Re-eval: 12/12/12 Charges: MMT x 1 Self care x 15' TE x 10'  Authorization: [employer]    Past Medical History:  Past Medical History  Diagnosis Date  . Hypertension   . Hypercholesteremia   . Arthritis   . Gout     NO RECENT FLARE UPS   Past Surgical History:  Past Surgical History  Procedure Date  . Right inguinal hernia   . Colonoscopy 08/03/2012    Procedure: COLONOSCOPY;  Surgeon: Malissa Hippo, MD;  [facility];  Service: Endoscopy;  Laterality: N/A;  830  . Total hip arthroplasty 10/23/2012    Procedure: TOTAL HIP ARTHROPLASTY;  Surgeon: Loanne Drilling, MD;  Location: [facility];  Service: Orthopedics;  Laterality: Right;    Subjective Symptoms/Limitations Symptoms: No pain, mostly stiffness. Pain Assessment Currently in Pain?: No/denies  Precautions/Restrictions  Posterior THR  Assessment RLE Strength Right Hip Flexion:  (4+/5 was 2/5) Right Hip Extension: 4/5 (was 4/5) Right Hip ABduction: 4/5 (was 2/5) Right Knee Flexion:  (4+/5 was 3/5) Right Knee Extension: 5/5 (was 2/5) Right Ankle Dorsiflexion: 5/5 (was 4/5) Right Ankle Plantar Flexion: 5/5 (3-/5)  Exercise/Treatments Machines for Strengthening Cybex Knee Extension: 2 Pl 2 x 15 Standing Stairs: 2RT recip w/1HR  Physical Therapy Assessment and Plan PT Assessment and Plan Clinical Impression Statement: Pt is progressing well. Pt presents with improved over all LE strength. Pt is limited in functional strength and power. Pt also continues to present with gait deviations due to weakness. Pt would benefit from continuing skilled PT to improve upon remaining deficits. PT Plan: Recommend to continue PT x 4 wks to improve remaining deficits.    Goals Home Exercise Program Pt  will Perform Home Exercise Program: Independently PT Short Term Goals Time to Complete Short Term Goals: 2 weeks PT Short Term Goal 1: Pt to be able to stand for 30 minutes PT Short Term Goal 1 - Progress: Met PT Short Term Goal 2: Pt to be able to amulate for 30 minutes  PT Short Term Goal 2 - Progress: Progressing toward goal PT Short Term Goal 3: Pt to be able to sleep for 4 hours straight PT Short Term Goal 3 - Progress: Met PT Long Term Goals PT Long Term Goal 1: Strength to be improved one grade to be able to go up and down steps reciprocally PT Long Term Goal 2: Pt to be able to walk without a cane  PT Long Term Goal 2 - Progress: Met Long Term Goal 3: Pt to be able to sleep throughout the night. Long Term Goal 3 Progress: Met Long Term Goal 4: Pt to be able to squat and pick up 20 lb. without difficulty  Problem List Patient Active Problem List  Diagnosis  . NONSPEC ELEVATION OF LEVELS OF TRANSAMINASE/LDH  . OA (osteoarthritis) of hip  . Postop Hyponatremia  . Difficulty in walking  . Weakness of right leg    PT - End of Session Equipment Utilized During Treatment: Gait belt Activity Tolerance: Patient tolerated treatment well General Behavior During Session: [facility] for tasks performed Cognition: [facility] for tasks performed  Seth Bake, PTA 12/12/2012, 10:43 AM  Physician Documentation Your signature is required to indicate approval of the treatment plan as stated above.  Please  sign and either send electronically or make a copy of this report for your files and return this physician signed original.   Please mark one 1.__approve of plan  2. ___approve of plan with the following conditions.   ______________________________                                                          _____________________ Physician Signature                                                                                                             Date

## 2012-12-14 ENCOUNTER — Ambulatory Visit (HOSPITAL_COMMUNITY)
Admission: RE | Admit: 2012-12-14 | Discharge: 2012-12-14 | Disposition: A | Discharge status: Home / Self Care | Payer: 59 | Source: Ambulatory Visit | Attending: Orthopedic Surgery | Admitting: Orthopedic Surgery

## 2012-12-14 NOTE — Progress Notes (Signed)
Physical Therapy Treatment Patient Details  Name: Manuel Torres MRN: [id number] Date of Birth: February 09, 1961  Today's Date: 12/14/2012 Time: 4098-1191 PT Time Calculation (min): 44 min  Visit#: 9  of 16   Re-eval: 12/12/12 Charges: Therex x 40'  Authorization: [employer]    Subjective: Symptoms/Limitations Symptoms: PT complains of stiffness, no pain. Pain Assessment Currently in Pain?: No/denies  Precautions/Restrictions     Exercise/Treatments Aerobic Tread Mill: gt trainer LL 93; 10' at 0.55 cyc/sec Machines for Strengthening Cybex Knee Extension: 2 Pl 2 x 15 Standing Lateral Step Up: 15 reps;Step Height: 6";Right;Hand Hold: 2 Forward Step Up: 15 reps;Step Height: 6";Hand Hold: 1;Right Wall Squat: 10 reps;5 seconds Stairs: 2RT recip w/1HR SLS with Vectors: 3 x 10" R only  Physical Therapy Assessment and Plan PT Assessment and Plan Clinical Impression Statement: Pt presents with improved gait mechanics on TM with decreased assistance from UE's. Pt continues to display improvements in strength power and stability. Pt requires multimodal cueing with stair training to avoid forward trunk lean. Began balance training to improve stability. PT Plan: Continue to progress functional strength, power and stability per PT POC.     Problem List Patient Active Problem List  Diagnosis  . NONSPEC ELEVATION OF LEVELS OF TRANSAMINASE/LDH  . OA (osteoarthritis) of hip  . Postop Hyponatremia  . Difficulty in walking  . Weakness of right leg    PT - End of Session Equipment Utilized During Treatment: Gait belt Activity Tolerance: Patient tolerated treatment well General Behavior During Session: [facility] for tasks performed Cognition: [facility] for tasks performed  Seth Bake, PTA 12/14/2012, 10:28 AM

## 2012-12-19 ENCOUNTER — Ambulatory Visit (HOSPITAL_COMMUNITY)
Admission: RE | Admit: 2012-12-19 | Discharge: 2012-12-19 | Disposition: A | Discharge status: Home / Self Care | Payer: 59 | Source: Ambulatory Visit | Attending: Orthopedic Surgery | Admitting: Orthopedic Surgery

## 2012-12-19 DIAGNOSIS — M6281 Muscle weakness (generalized): Secondary | ICD-10-CM | POA: Insufficient documentation

## 2012-12-19 DIAGNOSIS — R262 Difficulty in walking, not elsewhere classified: Secondary | ICD-10-CM | POA: Insufficient documentation

## 2012-12-19 DIAGNOSIS — M25559 Pain in unspecified hip: Secondary | ICD-10-CM | POA: Insufficient documentation

## 2012-12-19 DIAGNOSIS — IMO0001 Reserved for inherently not codable concepts without codable children: Secondary | ICD-10-CM | POA: Insufficient documentation

## 2012-12-19 DIAGNOSIS — M25659 Stiffness of unspecified hip, not elsewhere classified: Secondary | ICD-10-CM | POA: Insufficient documentation

## 2012-12-19 NOTE — Progress Notes (Signed)
Physical Therapy Treatment Patient Details  Name: Manuel Torres MRN: [id number] Date of Birth: [birth year]  Today's Date: 12/19/2012 Time: 4098-1191 PT Time Calculation (min): 37 min  Visit#: 10  of 16   Re-eval: 01/09/13 Charges: Gait x 10' Therex x 25'   Authorization: [employer]   Subjective: Symptoms/Limitations Symptoms: Pt states that he is having some soreness in his L side. Pain Assessment Currently in Pain?: Yes Pain Score:   6 Pain Location:  ([place] trunk) Pain Orientation: Left   Exercise/Treatments Aerobic Tread Mill: gt trainer LL 93; 10' at 0.55 cyc/sec Machines for Strengthening Cybex Knee Extension: 2 Pl 2 x 15 Standing Lateral Step Up: 15 reps;Step Height: 6";Right;Hand Hold: 2 Forward Step Up: 15 reps;Step Height: 6";Right;Hand Hold: 0 Wall Squat: 5 seconds;15 reps Stairs: 2RT recip w/1HR  Physical Therapy Assessment and Plan PT Assessment and Plan Clinical Impression Statement: Pt continues to display improved it power and control in R quad. Pt continues to require vc's for posture with gait training. Pt is able to keep trunk upright when ascending stairs. Pt states pain decrease to 3/10 in L side at end of session. PT Plan: Continue to progress functional strength, power and stability per PT POC.     Problem List Patient Active Problem List  Diagnosis  . NONSPEC ELEVATION OF LEVELS OF TRANSAMINASE/LDH  . OA (osteoarthritis) of hip  . Postop Hyponatremia  . Difficulty in walking  . Weakness of right leg    PT - End of Session Activity Tolerance: Patient tolerated treatment well General Behavior During Session: [facility] for tasks performed Cognition: [facility] for tasks performed  Seth Bake, PTA 12/19/2012, 10:16 AM

## 2012-12-21 ENCOUNTER — Ambulatory Visit (HOSPITAL_COMMUNITY)
Admission: RE | Admit: 2012-12-21 | Discharge: 2012-12-21 | Disposition: A | Discharge status: Home / Self Care | Payer: 59 | Source: Ambulatory Visit | Attending: Family Medicine | Admitting: Family Medicine

## 2012-12-21 DIAGNOSIS — R29898 Other symptoms and signs involving the musculoskeletal system: Secondary | ICD-10-CM

## 2012-12-21 DIAGNOSIS — R262 Difficulty in walking, not elsewhere classified: Secondary | ICD-10-CM

## 2012-12-21 NOTE — Progress Notes (Signed)
Physical Therapy Treatment Patient Details  Name: Manuel Torres MRN: [id number] Date of Birth: 17-Jul-1961  Today's Date: 12/21/2012 Time: 0930-1015 PT Time Calculation (min): 45 min Visit#: 11  of 16   Re-eval: 01/09/13 Charges:  therex 42'   Subjective: Symptoms/Limitations Symptoms: Pt. states he is doing well today without c/o pain.  Pain Assessment Pain Score: 0-No pain   Exercise/Treatments Aerobic Tread Mill: gt trainer LL 93; 10' at 0.55 cyc/sec Machines for Strengthening Cybex Knee Extension: 2.5PL 2X10 reps Standing Lateral Step Up: 15 reps;Step Height: 6";Right;Hand Hold: 2 Forward Step Up: 15 reps;Step Height: 6";Right;Hand Hold: 0 Step Down: 15 reps;Step Height: 4";Hand Hold: 1;Right Wall Squat: 15 reps;5 seconds Stairs: 2RT recip w/1HR Rocker Board: 2 minutes SLS with Vectors: 5 x 10" R only      Physical Therapy Assessment and Plan PT Assessment and Plan Clinical Impression Statement: Pt. progressing with overall form and demonstration of strength with functional activities.   R knee tends to drift medially with eccentric phase of descending stairs when fatigued.  Able to increase weight for cybex quad today as well as vector stance repititions.  No pain reported at end of session, however noted fatigue. PT Plan: Continue to progress functional strength, power and stability per PT POC.     Problem List Patient Active Problem List  Diagnosis  . NONSPEC ELEVATION OF LEVELS OF TRANSAMINASE/LDH  . OA (osteoarthritis) of hip  . Postop Hyponatremia  . Difficulty in walking  . Weakness of right leg    PT - End of Session Activity Tolerance: Patient tolerated treatment well General Behavior During Session: [facility] for tasks performed Cognition: [facility] for tasks performed   Lurena Nida, PTA/CLT 12/21/2012, 12:08 PM

## 2012-12-26 ENCOUNTER — Ambulatory Visit (HOSPITAL_COMMUNITY)
Admission: RE | Admit: 2012-12-26 | Discharge: 2012-12-26 | Disposition: A | Discharge status: Home / Self Care | Payer: 59 | Source: Ambulatory Visit | Attending: Family Medicine | Admitting: Family Medicine

## 2012-12-26 NOTE — Progress Notes (Signed)
Physical Therapy Treatment Patient Details  Name: Manuel Torres MRN: [id number] Date of Birth: [birth year]  Today's Date: 12/26/2012 Time: 0940-1020 PT Time Calculation (min): 40 min  Visit#: 12 of 16  Re-eval: 01/09/13 Charges:  therex 38'  Subjective: Symptoms/Limitations Symptoms: Pt. states his R hip is sore this morning. thinks he slept on it wrong last night.  No other pain reported. Pain Assessment Currently in Pain?: No/denies   Exercise/Treatments Aerobic Tread Mill: gt trainer LL 93; 10' at 0.55 cyc/sec Machines for Strengthening Cybex Knee Extension: 2.5PL 2X10 reps Standing Step Down: 15 reps;Step Height: 4";Hand Hold: 1;Right Wall Squat: 15 reps;5 seconds Stairs: 2RT recip w/1HR Rocker Board: 2 minutes SLS with Vectors: 5 x 10" R only    Physical Therapy Assessment and Plan PT Assessment and Plan Clinical Impression Statement: Still unable to progress to 6" forward step down due to lacking eccentric control.  Noted overuse of R quad/antalgia when ascending stairs in stairwell due to R hip soreness. Improved activity tolerance noted today.  May want to progress to elliptical next visit. PT Plan: Continue to progress functional strength, power and stability per PT POC.  May progress to elliptical.     Problem List Patient Active Problem List  Diagnosis  . NONSPEC ELEVATION OF LEVELS OF TRANSAMINASE/LDH  . OA (osteoarthritis) of hip  . Postop Hyponatremia  . Difficulty in walking  . Weakness of right leg    PT - End of Session Activity Tolerance: Patient tolerated treatment well General Behavior During Session: [facility] for tasks performed Cognition: [facility] for tasks performed   Lurena Nida, PTA/CLT 12/26/2012, 10:23 AM

## 2012-12-28 ENCOUNTER — Ambulatory Visit (HOSPITAL_COMMUNITY): Payer: 59

## 2013-01-02 ENCOUNTER — Ambulatory Visit (HOSPITAL_COMMUNITY)
Admission: RE | Admit: 2013-01-02 | Discharge: 2013-01-02 | Disposition: A | Discharge status: Home / Self Care | Payer: 59 | Source: Ambulatory Visit | Attending: Family Medicine | Admitting: Family Medicine

## 2013-01-02 NOTE — Progress Notes (Signed)
Physical Therapy Treatment Patient Details  Name: Manuel Torres MRN: [id number] Date of Birth: [birth year]  Today's Date: 01/02/2013 Time: 0930-1013 PT Time Calculation (min): 43 min  Visit#: 13 of 16  Re-eval: 01/09/13 Charges: Gait training x 15' Therex x 25'  Subjective: Symptoms/Limitations Symptoms: Pt states that he feels tightness in his hip when completing his exercises at home. Pain Assessment Currently in Pain?: Yes Pain Score:   2 Pain Location: Hip Pain Orientation: Left    Exercise/Treatments Aerobic Tread Mill: Gt training on TM Level 2.0 Machines for Strengthening Cybex Knee Extension: 3PL 2X10 reps Standing Forward Step Up: 15 reps;Step Height: 6";Right;Hand Hold: 0;Limitations Forward Step Up Limitations: focusing on posutre Step Down: 15 reps;Step Height: 4";Hand Hold: 1;Right;Limitations Step Down Limitations: Focusing on control; attempted 6" but unable to control Wall Squat: 15 reps;5 seconds Stairs: 2RT recip w/1HR Rocker Board: 2 minutes SLS with Vectors: 5 x 10" R only  Physical Therapy Assessment and Plan PT Assessment and Plan Clinical Impression Statement: Pt continues to have difficulty with eccentric quad control. Pt displays improved posture with step-sep ups. Pt requires vc's during gait training and throughout tx to increase stride length. Pt reports pain decrease to 1/10 at end of session. PT Plan: Continue to progress functional strength, power and stability per PT POC.  May progress to elliptical.     Problem List Patient Active Problem List  Diagnosis  . NONSPEC ELEVATION OF LEVELS OF TRANSAMINASE/LDH  . OA (osteoarthritis) of hip  . Postop Hyponatremia  . Difficulty in walking  . Weakness of right leg    PT - End of Session Activity Tolerance: Patient tolerated treatment well General Behavior During Session: [facility] for tasks performed Cognition: [facility] for tasks performed   Seth Bake, PTA  01/02/2013, 12:29 PM

## 2013-01-04 ENCOUNTER — Ambulatory Visit (HOSPITAL_COMMUNITY)
Admission: RE | Admit: 2013-01-04 | Discharge: 2013-01-04 | Disposition: A | Discharge status: Home / Self Care | Payer: 59 | Source: Ambulatory Visit | Attending: *Deleted | Admitting: *Deleted

## 2013-01-04 NOTE — Progress Notes (Signed)
Physical Therapy Treatment Patient Details  Name: Manuel Torres MRN: [id number] Date of Birth: [birth year]  Today's Date: 01/04/2013 Time: 8295-6213 PT Time Calculation (min): 41 min  Visit#: 14 of 16  Re-eval: 01/09/13 Charges: Gait training x 20' Therex x18'    Subjective: Symptoms/Limitations Symptoms: Pt reports pain form gout in L knee. Pain Assessment Currently in Pain?: Yes Pain Score:   2 Pain Location: Hip Pain Orientation: Right Multiple Pain Sites: Yes   Exercise/Treatments Aerobic Tread Mill: gt trainer LL 93; 15' at 0.55-.60 cyc/sec Machines for Strengthening Cybex Knee Extension: 3PL 2X15 Standing Forward Step Up: 15 reps;Step Height: 6";Right;Hand Hold: 0;Limitations Forward Step Up Limitations: focusing on posutre Step Down: 15 reps;Step Height: 4";Hand Hold: 1;Right;Limitations Step Down Limitations: Focusing on control; attempted 6" but unable to control Stairs: 3RT recip w/1HR  Physical Therapy Assessment and Plan PT Assessment and Plan Clinical Impression Statement: Tx focus on improving functional strength and activity tolerance for RTW. Increased duration on TM and number of stairs with stair training with minimal difficulty. Pt continues to have difficulty with eccentric quad control with stair descent. Pt reports no increase in hip pain and knee pain decrease to 4-5/10 at end of session. PT Plan: Continue to progress functional strength, power and stability per PT POC.  May progress to elliptical.     Problem List Patient Active Problem List  Diagnosis  . NONSPEC ELEVATION OF LEVELS OF TRANSAMINASE/LDH  . OA (osteoarthritis) of hip  . Postop Hyponatremia  . Difficulty in walking  . Weakness of right leg    PT - End of Session Activity Tolerance: Patient tolerated treatment well General Behavior During Session: [facility] for tasks performed Cognition: [facility] for tasks performed  Seth Bake, PTA  01/04/2013, 5:31 PM

## 2013-01-09 ENCOUNTER — Ambulatory Visit (HOSPITAL_COMMUNITY)
Admission: RE | Admit: 2013-01-09 | Discharge: 2013-01-09 | Disposition: A | Discharge status: Home / Self Care | Payer: 59 | Source: Ambulatory Visit | Attending: Family Medicine | Admitting: Family Medicine

## 2013-01-09 NOTE — Evaluation (Addendum)
Physical Therapy Re-evaluation  Patient Details  Name: Manuel Torres MRN: [id number] Date of Birth: September 10, 1961  Today's Date: 01/09/2013 Time: 0940-1005 PT Time Calculation (min): 25 min  Visit#: 15 of 16  Re-eval: 01/09/13 Charges: MMT x 1  Self care x 10' Therex x 15'   Past Medical History:  Past Medical History  Diagnosis Date  . Hypertension   . Hypercholesteremia   . Arthritis   . Gout     NO RECENT FLARE UPS   Past Surgical History:  Past Surgical History  Procedure Laterality Date  . Right inguinal hernia    . Colonoscopy  08/03/2012    Procedure: COLONOSCOPY;  Surgeon: Malissa Hippo, MD;  [facility];  Service: Endoscopy;  Laterality: N/A;  830  . Total hip arthroplasty  10/23/2012    Procedure: TOTAL HIP ARTHROPLASTY;  Surgeon: Loanne Drilling, MD;  Location: [facility];  Service: Orthopedics;  Laterality: Right;    Subjective Symptoms/Limitations Symptoms: Pt states that his L knee is better. Pain Assessment Currently in Pain?: No/denies   Assessment RLE Strength Right Hip Flexion:  (4+/5) Right Hip Extension: 5/5 (was 4/5) Right Hip ABduction: 5/5 (was 4/5) Right Knee Flexion: 5/5 (was 4+/5) Right Knee Extension: 5/5 Right Ankle Dorsiflexion: 5/5 Right Ankle Plantar Flexion: 5/5  Exercise/Treatments Aerobic Elliptical: 5'@1 .0 Machines for Strengthening Cybex Knee Extension: 5PL BLE 2X15  Physical Therapy Assessment and Plan PT Assessment and Plan Clinical Impression Statement: Pt has progressed well. All RLE are WNL except for hip flexion which is 4+/5. Functional strength and activity tolerance has also improved. 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 Pt is comfortable with D/C to [facility]. PT Plan: Recommend D/C to [facility].    Goals Home Exercise Program Pt will Perform Home Exercise Program: Independently PT Short Term Goals Time to Complete Short Term Goals: 2 weeks PT Short Term Goal 1: Pt to  be able to stand for 30 minutes PT Short Term Goal 1 - Progress: Met PT Short Term Goal 2: Pt to be able to amulate for 30 minutes  PT Short Term Goal 2 - Progress: Met PT Short Term Goal 3: Pt to be able to sleep for 4 hours straight PT Short Term Goal 3 - Progress: Met PT Long Term Goals PT Long Term Goal 1: Strength to be improved one grade to be able to go up and down steps reciprocally PT Long Term Goal 1 - Progress: Met PT Long Term Goal 2: Pt to be able to walk without a cane  PT Long Term Goal 2 - Progress: Met Long Term Goal 3: Pt to be able to sleep throughout the night. Long Term Goal 3 Progress: Met Long Term Goal 4: Pt to be able to squat and pick up 20 lb. without difficulty Long Term Goal 4 Progress: Met  Problem List Patient Active Problem List  Diagnosis  . NONSPEC ELEVATION OF LEVELS OF TRANSAMINASE/LDH  . OA (osteoarthritis) of hip  . Postop Hyponatremia  . Difficulty in walking  . Weakness of right leg    PT - End of Session Activity Tolerance: Patient tolerated treatment well General Behavior During Session: [facility] for tasks performed Cognition: [facility] for tasks performed  Seth Bake, PTA  01/09/2013, 10:20 AM  Physician Documentation Your signature is required to indicate approval of the treatment plan as stated above.  Please sign and either send electronically or make a copy of this report for your  files and return this physician signed original.   Please mark one 1.__approve of plan  2. ___approve of plan with the following conditions.   ______________________________                                                          _____________________ Physician Signature                                                                                                             Date

## 2013-01-11 ENCOUNTER — Inpatient Hospital Stay (HOSPITAL_COMMUNITY): Admission: RE | Admit: 2013-01-11 | Payer: 59 | Source: Ambulatory Visit | Admitting: Physical Therapy

## 2013-01-15 ENCOUNTER — Ambulatory Visit (HOSPITAL_COMMUNITY): Payer: 59 | Admitting: Physical Therapy

## 2013-04-06 ENCOUNTER — Other Ambulatory Visit: Payer: Self-pay | Admitting: Family Medicine

## 2013-04-14 ENCOUNTER — Encounter: Payer: Self-pay | Admitting: *Deleted

## 2013-04-19 ENCOUNTER — Encounter: Payer: Self-pay | Admitting: Family Medicine

## 2013-04-19 ENCOUNTER — Ambulatory Visit (INDEPENDENT_AMBULATORY_CARE_PROVIDER_SITE_OTHER): Payer: 59 | Admitting: Family Medicine

## 2013-04-19 VITALS — BP 135/84 | HR 60 | Wt 284.6 lb

## 2013-04-19 DIAGNOSIS — R7401 Elevation of levels of liver transaminase levels: Secondary | ICD-10-CM

## 2013-04-19 DIAGNOSIS — R7303 Prediabetes: Secondary | ICD-10-CM

## 2013-04-19 DIAGNOSIS — R7309 Other abnormal glucose: Secondary | ICD-10-CM

## 2013-04-19 DIAGNOSIS — E1169 Type 2 diabetes mellitus with other specified complication: Secondary | ICD-10-CM | POA: Insufficient documentation

## 2013-04-19 DIAGNOSIS — M109 Gout, unspecified: Secondary | ICD-10-CM

## 2013-04-19 DIAGNOSIS — E785 Hyperlipidemia, unspecified: Secondary | ICD-10-CM

## 2013-04-19 DIAGNOSIS — I10 Essential (primary) hypertension: Secondary | ICD-10-CM

## 2013-04-19 MED ORDER — ATORVASTATIN CALCIUM 20 MG PO TABS: 20.0000 mg | ORAL_TABLET | Freq: Every day | ORAL | Status: DC

## 2013-04-19 MED ORDER — LISINOPRIL 5 MG PO TABS: 5.0000 mg | ORAL_TABLET | Freq: Every day | ORAL | Status: DC

## 2013-04-19 MED ORDER — ALLOPURINOL 300 MG PO TABS: 300.0000 mg | ORAL_TABLET | Freq: Every day | ORAL | Status: DC

## 2013-04-19 MED ORDER — HYDROCHLOROTHIAZIDE 25 MG PO TABS: 25.0000 mg | ORAL_TABLET | Freq: Every day | ORAL | Status: DC

## 2013-04-19 MED ORDER — POTASSIUM CHLORIDE CRYS ER 20 MEQ PO TBCR: EXTENDED_RELEASE_TABLET | ORAL | Status: DC

## 2013-04-19 NOTE — Progress Notes (Signed)
  Subjective:    Patient ID: Manuel Torres, male    DOB: 03-05-1961, 52 y.o.   MRN: [id number]  HPI Patient in today for a 1 check over regarding his health problems he is trying eat well he is trying to exercise try to keep his weight down he went through hip surgery and did very well with it. He denies any rectal bleeding hematuria denies sweats chills nausea vomiting chest pressure tightness pain shortness of breath. States his energy level is fair. Past medical history hypertension hyperlipidemia obesity. Prediabetes.  Review of Systems See above.    Objective:   Physical Exam  Vital signs stable. Lungs clear heart regular pulse normal abdomen soft extremities no edema skin warm dry foot exam normal      Assessment & Plan:  3 diabetes-check hemoglobin A1c await results Hyperlipidemia check lab work. 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 May need to undergo further testing depending on the results of all these tests. Patient is to followup by late fall November or December.

## 2013-04-21 LAB — HEPATIC FUNCTION PANEL
ALT: 32 U/L (ref 0–53)
AST: 36 U/L (ref 0–37)
Albumin: 4 g/dL (ref 3.5–5.2)
Alkaline Phosphatase: 86 U/L (ref 39–117)
Bilirubin, Direct: 0.1 mg/dL (ref 0.0–0.3)
Indirect Bilirubin: 0.2 mg/dL (ref 0.0–0.9)
Total Bilirubin: 0.3 mg/dL (ref 0.3–1.2)
Total Protein: 6.5 g/dL (ref 6.0–8.3)

## 2013-04-21 LAB — LIPID PANEL
LDL Cholesterol: 88 mg/dL (ref 0–99)
Total CHOL/HDL Ratio: 4.3 Ratio
VLDL: 19 mg/dL (ref 0–40)

## 2013-04-21 LAB — BASIC METABOLIC PANEL
BUN: 10 mg/dL (ref 6–23)
Chloride: 104 mEq/L (ref 96–112)
Glucose, Bld: 111 mg/dL — ABNORMAL HIGH (ref 70–99)
Potassium: 4.4 mEq/L (ref 3.5–5.3)

## 2013-04-21 LAB — HEMOGLOBIN A1C: Mean Plasma Glucose: 123 mg/dL — ABNORMAL HIGH (ref <117)

## 2013-04-22 ENCOUNTER — Encounter: Payer: Self-pay | Admitting: Family Medicine

## 2013-04-24 ENCOUNTER — Other Ambulatory Visit: Payer: Self-pay | Admitting: Family Medicine

## 2013-04-24 MED ORDER — POTASSIUM CHLORIDE CRYS ER 20 MEQ PO TBCR: EXTENDED_RELEASE_TABLET | ORAL | Status: DC

## 2013-04-24 MED ORDER — LISINOPRIL 5 MG PO TABS: 5.0000 mg | ORAL_TABLET | Freq: Every day | ORAL | Status: DC

## 2013-04-24 MED ORDER — HYDROCHLOROTHIAZIDE 25 MG PO TABS: 25.0000 mg | ORAL_TABLET | Freq: Every day | ORAL | Status: DC

## 2013-04-24 MED ORDER — METHOCARBAMOL 500 MG PO TABS: 500.0000 mg | ORAL_TABLET | Freq: Four times a day (QID) | ORAL | Status: DC | PRN

## 2013-04-24 MED ORDER — ALLOPURINOL 300 MG PO TABS: 300.0000 mg | ORAL_TABLET | Freq: Every day | ORAL | Status: DC

## 2013-04-24 MED ORDER — ATORVASTATIN CALCIUM 20 MG PO TABS: 20.0000 mg | ORAL_TABLET | Freq: Every day | ORAL | Status: DC

## 2013-04-24 NOTE — Telephone Encounter (Signed)
Rxs resent electronically to [facility]. Patient notified.

## 2013-04-24 NOTE — Telephone Encounter (Signed)
Patient says that at his 04/19/13 visit he was supposed to have refills of all his medicine called in to [facility] in [place]. He stated that the pharmacy has not received them yet.

## 2013-04-26 NOTE — Telephone Encounter (Signed)
I do not have security to close this encounter due to it being a refill. Can you please close.

## 2013-10-29 ENCOUNTER — Ambulatory Visit (INDEPENDENT_AMBULATORY_CARE_PROVIDER_SITE_OTHER): Payer: 59 | Admitting: Family Medicine

## 2013-10-29 ENCOUNTER — Encounter: Payer: Self-pay | Admitting: Family Medicine

## 2013-10-29 VITALS — BP 130/90 | Ht 71.0 in | Wt 289.8 lb

## 2013-10-29 DIAGNOSIS — M109 Gout, unspecified: Secondary | ICD-10-CM

## 2013-10-29 DIAGNOSIS — I10 Essential (primary) hypertension: Secondary | ICD-10-CM

## 2013-10-29 DIAGNOSIS — Z125 Encounter for screening for malignant neoplasm of prostate: Secondary | ICD-10-CM

## 2013-10-29 DIAGNOSIS — R7309 Other abnormal glucose: Secondary | ICD-10-CM

## 2013-10-29 DIAGNOSIS — R7303 Prediabetes: Secondary | ICD-10-CM

## 2013-10-29 NOTE — Progress Notes (Signed)
   Subjective:    Patient ID: Manuel Torres, male    DOB: [birth year], 52 y.o.   MRN: [id number]  HPI Patient is here today for 6 month check up.  Watching diet to some degree exercising some taking medication denies any particular complications or problems currently PMH benign-HTN/hyperlipidemia/right hip arthritis with recent hip surgery The only concern he has is a cold that he has been battling for a week and a half.  No wheezing denies fever chills Doesn't smoke  Review of Systems Denies chest pain shortness breath swelling in the legs. Denies abdominal pain.    Objective:   Physical Exam  Constitutional: He appears well-developed and well-nourished.  HENT:  Head: Normocephalic and atraumatic.  Right Ear: External ear normal.  Left Ear: External ear normal.  Nose: Nose normal.  Mouth/Throat: Oropharynx is clear and moist.  Neck: No thyromegaly present.  Cardiovascular: Normal rate, regular rhythm and normal heart sounds.   No murmur heard. Pulmonary/Chest: Effort normal and breath sounds normal. No respiratory distress. He has no wheezes.  Abdominal: Soft. Bowel sounds are normal. He exhibits no distension and no mass. There is no tenderness.  Musculoskeletal: Normal range of motion. He exhibits no edema.  Lymphadenopathy:    He has no cervical adenopathy.  Neurological: He is alert. He exhibits normal muscle tone.  Skin: Skin is warm and dry. No erythema.  Psychiatric: He has a normal mood and affect.          Assessment & Plan:  Viral URI-it persists needs antibiotics  HTN-continue current measures watch diet closely reduce portions try to exercise try to bring weight down GOUT-stable currently continue medication minimize proteins and diet Hyperlipidemia check lab work in 6 months no need to check currently  Prostate exam on next visit in 6 months

## 2013-11-05 LAB — BASIC METABOLIC PANEL
Glucose, Bld: 123 mg/dL — ABNORMAL HIGH (ref 70–99)
Potassium: 4.4 mEq/L (ref 3.5–5.3)
Sodium: 136 mEq/L (ref 135–145)

## 2013-11-05 LAB — PSA: PSA: 1.21 ng/mL (ref <=4.00)

## 2013-11-05 LAB — URIC ACID: Uric Acid, Serum: 5.9 mg/dL (ref 4.0–7.8)

## 2013-11-07 ENCOUNTER — Encounter: Payer: Self-pay | Admitting: Family Medicine

## 2014-04-23 ENCOUNTER — Ambulatory Visit (INDEPENDENT_AMBULATORY_CARE_PROVIDER_SITE_OTHER): Payer: 59 | Admitting: Family Medicine

## 2014-04-23 ENCOUNTER — Encounter: Payer: Self-pay | Admitting: Family Medicine

## 2014-04-23 VITALS — BP 132/88 | Ht 71.0 in | Wt 279.0 lb

## 2014-04-23 DIAGNOSIS — E785 Hyperlipidemia, unspecified: Secondary | ICD-10-CM

## 2014-04-23 DIAGNOSIS — M109 Gout, unspecified: Secondary | ICD-10-CM

## 2014-04-23 DIAGNOSIS — E119 Type 2 diabetes mellitus without complications: Secondary | ICD-10-CM

## 2014-04-23 DIAGNOSIS — R7309 Other abnormal glucose: Secondary | ICD-10-CM

## 2014-04-23 DIAGNOSIS — Z79899 Other long term (current) drug therapy: Secondary | ICD-10-CM

## 2014-04-23 DIAGNOSIS — Z23 Encounter for immunization: Secondary | ICD-10-CM

## 2014-04-23 DIAGNOSIS — R7303 Prediabetes: Secondary | ICD-10-CM

## 2014-04-23 LAB — POCT GLYCOSYLATED HEMOGLOBIN (HGB A1C): Hemoglobin A1C: 5.9

## 2014-04-23 NOTE — Progress Notes (Signed)
   Subjective:    Patient ID: Manuel Torres, male    DOB: 09-08-1961, 53 y.o.   MRN: [id number]  Diabetes He presents for his follow-up diabetic visit. He has type 2 diabetes mellitus. He is compliant with treatment all of the time. He is following a diabetic diet. He participates in exercise weekly. His breakfast blood glucose range is generally 110-130 mg/dl. He does not see a podiatrist.Eye exam is current.   Concerns about cramps in arms and legs for the past 1 -2 months. We will check electrolytes today. Patient denies any gout flareups He states he's trying to be healthy with his diet staying active lost some weight Denies any problems with his medicines Does not smoke  Review of Systems Denies chest tightness pressure pain shortness breath nausea vomiting diarrhea denies sweats chills.    Objective:   Physical Exam  Neck no masses throat normal lungs clear no crackles heart is regular pulse normal abdomen is soft obese. Prostate exam normal extremities no edema     Assessment & Plan:  1. Diabetes His A1c shows prediabetes is getting a little bit worse she is watching diet has lost a little bit away. - POCT glycosylated hemoglobin (Hb A1C)  2. Prediabetes See above.  3. Gout Will monitor closely continue medication. - Uric acid  4. Encounter for long-term (current) use of other medications Because of some of his medications we do need to liver function kidney function. Plus also because of diabetes. Avoid excessive salt use excessive protein. - Hepatic function panel - Basic metabolic panel  5. Other and unspecified hyperlipidemia We'll check lab work because of history hyperlipidemia continue all medications - Lipid panel  6. Need for prophylactic vaccination with combined diphtheria-tetanus-pertussis (DTP) vaccine Needed today - Td vaccine greater than or equal to 7yo preservative free IM  Followup 6 months. Prostate exam completed today

## 2014-04-27 LAB — HEPATIC FUNCTION PANEL
ALT: 71 U/L — ABNORMAL HIGH (ref 0–53)
AST: 59 U/L — AB (ref 0–37)
Albumin: 4.1 g/dL (ref 3.5–5.2)
Alkaline Phosphatase: 78 U/L (ref 39–117)
BILIRUBIN DIRECT: 0.1 mg/dL (ref 0.0–0.3)
Indirect Bilirubin: 0.4 mg/dL (ref 0.2–1.2)
Total Bilirubin: 0.5 mg/dL (ref 0.2–1.2)
Total Protein: 7 g/dL (ref 6.0–8.3)

## 2014-04-27 LAB — LIPID PANEL
CHOL/HDL RATIO: 3.7 ratio
CHOLESTEROL: 139 mg/dL (ref 0–200)
HDL: 38 mg/dL — ABNORMAL LOW (ref >39)
LDL Cholesterol: 83 mg/dL (ref 0–99)
TRIGLYCERIDES: 88 mg/dL (ref <150)
VLDL: 18 mg/dL (ref 0–40)

## 2014-04-27 LAB — BASIC METABOLIC PANEL
BUN: 12 mg/dL (ref 6–23)
CO2: 28 mEq/L (ref 19–32)
Calcium: 9.2 mg/dL (ref 8.4–10.5)
Chloride: 100 mEq/L (ref 96–112)
Creat: 0.97 mg/dL (ref 0.50–1.35)
GLUCOSE: 129 mg/dL — AB (ref 70–99)
Potassium: 4.5 mEq/L (ref 3.5–5.3)
Sodium: 136 mEq/L (ref 135–145)

## 2014-04-27 LAB — URIC ACID: Uric Acid, Serum: 5.6 mg/dL (ref 4.0–7.8)

## 2014-05-01 ENCOUNTER — Other Ambulatory Visit: Payer: Self-pay

## 2014-05-01 DIAGNOSIS — R748 Abnormal levels of other serum enzymes: Secondary | ICD-10-CM

## 2014-05-25 LAB — HEPATIC FUNCTION PANEL
ALBUMIN: 3.6 g/dL (ref 3.5–5.2)
ALT: 54 U/L — ABNORMAL HIGH (ref 0–53)
AST: 50 U/L — ABNORMAL HIGH (ref 0–37)
Alkaline Phosphatase: 70 U/L (ref 39–117)
Bilirubin, Direct: 0.1 mg/dL (ref 0.0–0.3)
Indirect Bilirubin: 0.5 mg/dL (ref 0.2–1.2)
TOTAL PROTEIN: 6.3 g/dL (ref 6.0–8.3)
Total Bilirubin: 0.6 mg/dL (ref 0.2–1.2)

## 2014-05-26 ENCOUNTER — Encounter: Payer: Self-pay | Admitting: Family Medicine

## 2014-05-28 ENCOUNTER — Other Ambulatory Visit: Payer: Self-pay | Admitting: Family Medicine

## 2014-06-08 ENCOUNTER — Other Ambulatory Visit: Payer: Self-pay | Admitting: Family Medicine

## 2014-10-23 ENCOUNTER — Ambulatory Visit: Payer: 59 | Admitting: Family Medicine

## 2014-10-25 LAB — HM DIABETES EYE EXAM

## 2014-10-30 ENCOUNTER — Ambulatory Visit: Payer: 59 | Admitting: Family Medicine

## 2015-01-02 ENCOUNTER — Other Ambulatory Visit: Payer: Self-pay | Admitting: Family Medicine

## 2015-01-24 ENCOUNTER — Encounter: Payer: Self-pay | Admitting: Family Medicine

## 2015-01-24 ENCOUNTER — Ambulatory Visit (INDEPENDENT_AMBULATORY_CARE_PROVIDER_SITE_OTHER): Payer: 59 | Admitting: Family Medicine

## 2015-01-24 VITALS — BP 148/92 | Ht 71.0 in | Wt 274.0 lb

## 2015-01-24 DIAGNOSIS — R740 Nonspecific elevation of levels of transaminase and lactic acid dehydrogenase [LDH]: Secondary | ICD-10-CM | POA: Diagnosis not present

## 2015-01-24 DIAGNOSIS — Z125 Encounter for screening for malignant neoplasm of prostate: Secondary | ICD-10-CM | POA: Diagnosis not present

## 2015-01-24 DIAGNOSIS — R7309 Other abnormal glucose: Secondary | ICD-10-CM

## 2015-01-24 DIAGNOSIS — E119 Type 2 diabetes mellitus without complications: Secondary | ICD-10-CM

## 2015-01-24 DIAGNOSIS — I10 Essential (primary) hypertension: Secondary | ICD-10-CM | POA: Diagnosis not present

## 2015-01-24 DIAGNOSIS — E785 Hyperlipidemia, unspecified: Secondary | ICD-10-CM

## 2015-01-24 DIAGNOSIS — M1A0790 Idiopathic chronic gout, unspecified ankle and foot, without tophus (tophi): Secondary | ICD-10-CM

## 2015-01-24 DIAGNOSIS — R7401 Elevation of levels of liver transaminase levels: Secondary | ICD-10-CM

## 2015-01-24 DIAGNOSIS — E8881 Metabolic syndrome: Secondary | ICD-10-CM

## 2015-01-24 DIAGNOSIS — R7303 Prediabetes: Secondary | ICD-10-CM

## 2015-01-24 LAB — POCT GLYCOSYLATED HEMOGLOBIN (HGB A1C): Hemoglobin A1C: 6.2

## 2015-01-24 MED ORDER — ALLOPURINOL 300 MG PO TABS: 300.0000 mg | ORAL_TABLET | Freq: Every day | ORAL | Status: DC

## 2015-01-24 MED ORDER — POTASSIUM CHLORIDE CRYS ER 20 MEQ PO TBCR: 20.0000 meq | EXTENDED_RELEASE_TABLET | Freq: Every morning | ORAL | Status: DC

## 2015-01-24 MED ORDER — METFORMIN HCL 500 MG PO TABS: 250.0000 mg | ORAL_TABLET | Freq: Two times a day (BID) | ORAL | Status: DC

## 2015-01-24 MED ORDER — KETOCONAZOLE 2 % EX SHAM: MEDICATED_SHAMPOO | CUTANEOUS | Status: DC

## 2015-01-24 MED ORDER — LISINOPRIL 5 MG PO TABS: ORAL_TABLET | ORAL | Status: DC

## 2015-01-24 MED ORDER — ATORVASTATIN CALCIUM 20 MG PO TABS: ORAL_TABLET | ORAL | Status: DC

## 2015-01-24 MED ORDER — METFORMIN HCL 500 MG PO TABS
250.0000 mg | ORAL_TABLET | Freq: Two times a day (BID) | ORAL | Status: DC
Start: 2015-01-24 — End: 2017-01-24

## 2015-01-24 MED ORDER — HYDROCHLOROTHIAZIDE 25 MG PO TABS: ORAL_TABLET | ORAL | Status: DC

## 2015-01-24 NOTE — Progress Notes (Signed)
   Subjective:    Patient ID: Manuel Torres, male    DOB: 05/04/1961, 54 y.o.   MRN: [id number]  Diabetes He presents for his follow-up diabetic visit. He has type 2 diabetes mellitus. His disease course has been stable. There are no hypoglycemic associated symptoms. Pertinent negatives for hypoglycemia include no confusion. There are no diabetic associated symptoms. Pertinent negatives for diabetes include no chest pain, no fatigue, no polydipsia, no polyphagia and no weakness. Symptoms are stable. Current diabetic treatment includes diet. He is compliant with treatment most of the time. He monitors blood glucose at home 1-2 x per week. His highest blood glucose is 90-110 mg/dl. His overall blood glucose range is 90-110 mg/dl. He does not see a podiatrist.Eye exam is current.      Review of Systems  Constitutional: Negative for activity change, appetite change and fatigue.  HENT: Negative for congestion.   Respiratory: Negative for cough.   Cardiovascular: Negative for chest pain.  Gastrointestinal: Negative for abdominal pain.  Endocrine: Negative for polydipsia and polyphagia.  Neurological: Negative for weakness.  Psychiatric/Behavioral: Negative for confusion.       Objective:   Physical Exam  Constitutional: He appears well-nourished. No distress.  Cardiovascular: Normal rate, regular rhythm and normal heart sounds.   No murmur heard. Pulmonary/Chest: Effort normal and breath sounds normal. No respiratory distress.  Musculoskeletal: He exhibits no edema.  Lymphadenopathy:    He has no cervical adenopathy.  Neurological: He is alert.  Psychiatric: His behavior is normal.  Vitals reviewed.         Assessment & Plan:  1. Type 2 diabetes mellitus without complication F6E slightly elevated versus prediabetes but is progressively getting worse I recommend initiating metformin half of a tablet twice daily. Follow-up again in 6 months time watch diet. Try to lose weight. -  POCT glycosylated hemoglobin (Hb A1C)  2. Prediabetes Check your micro-protein watch diet exercise try to lose weight - Microalbumin, urine  3. Essential hypertension, benign Blood pressure under good control continue current measures. - Basic metabolic panel  4. Hyperlipemia Hyperlipidemia check lipid profile. Continue current measures - Lipid panel  5. Idiopathic chronic gout of foot without tophus, unspecified laterality Avoid excessive amount of proteins. Check uric acid level. - Uric acid  6. Elevated transaminase level Because of history of fatty liver as well as taking medication check liver profile - Hepatic function panel  7. Special screening for malignant neoplasm of prostate PSA screening indicated - PSA  8. Morbid obesity Patient encouraged to lose weight exercise try to bring weight down  9. Metabolic syndrome Best approach to minimizing risk for heart diseases keeping risk factors under good control this was discussed with the patient.  Greater than half the time spent discussing these multiple issues and answering questions [phone] 25 minutes

## 2015-01-25 LAB — MICROALBUMIN, URINE: Microalbumin, Urine: 6.4 ug/mL (ref 0.0–17.0)

## 2015-01-25 LAB — BASIC METABOLIC PANEL
BUN/Creatinine Ratio: 13 (ref 9–20)
BUN: 10 mg/dL (ref 6–24)
CALCIUM: 9.7 mg/dL (ref 8.7–10.2)
CHLORIDE: 99 mmol/L (ref 97–108)
CO2: 24 mmol/L (ref 18–29)
CREATININE: 0.78 mg/dL (ref 0.76–1.27)
GFR, EST AFRICAN AMERICAN: 118 mL/min/{1.73_m2} (ref >59)
GFR, EST NON AFRICAN AMERICAN: 102 mL/min/{1.73_m2} (ref >59)
Glucose: 115 mg/dL — ABNORMAL HIGH (ref 65–99)
Potassium: 4.3 mmol/L (ref 3.5–5.2)
Sodium: 139 mmol/L (ref 134–144)

## 2015-01-25 LAB — HEPATIC FUNCTION PANEL
ALBUMIN: 4.2 g/dL (ref 3.5–5.5)
ALT: 57 IU/L — AB (ref 0–44)
AST: 62 IU/L — ABNORMAL HIGH (ref 0–40)
Alkaline Phosphatase: 82 IU/L (ref 39–117)
BILIRUBIN, DIRECT: 0.2 mg/dL (ref 0.00–0.40)
Bilirubin Total: 0.7 mg/dL (ref 0.0–1.2)
Total Protein: 7.5 g/dL (ref 6.0–8.5)

## 2015-01-25 LAB — LIPID PANEL
Chol/HDL Ratio: 4.3 ratio units (ref 0.0–5.0)
Cholesterol, Total: 147 mg/dL (ref 100–199)
HDL: 34 mg/dL — ABNORMAL LOW (ref >39)
LDL Calculated: 95 mg/dL (ref 0–99)
Triglycerides: 92 mg/dL (ref 0–149)
VLDL CHOLESTEROL CAL: 18 mg/dL (ref 5–40)

## 2015-01-25 LAB — PSA: PSA: 0.7 ng/mL (ref 0.0–4.0)

## 2015-01-25 LAB — URIC ACID: Uric Acid: 6 mg/dL (ref 3.7–8.6)

## 2015-01-26 ENCOUNTER — Encounter: Payer: Self-pay | Admitting: Family Medicine

## 2015-02-12 ENCOUNTER — Encounter: Payer: Self-pay | Admitting: Family Medicine

## 2015-02-12 ENCOUNTER — Ambulatory Visit (INDEPENDENT_AMBULATORY_CARE_PROVIDER_SITE_OTHER): Payer: 59 | Admitting: Family Medicine

## 2015-02-12 VITALS — BP 140/86 | Temp 99.3°F | Ht 71.0 in | Wt 273.4 lb

## 2015-02-12 DIAGNOSIS — J329 Chronic sinusitis, unspecified: Secondary | ICD-10-CM | POA: Diagnosis not present

## 2015-02-12 MED ORDER — AMOXICILLIN-POT CLAVULANATE 875-125 MG PO TABS: 1.0000 | ORAL_TABLET | Freq: Two times a day (BID) | ORAL | Status: AC

## 2015-02-12 MED ORDER — BENZONATATE 100 MG PO CAPS: 100.0000 mg | ORAL_CAPSULE | Freq: Three times a day (TID) | ORAL | Status: DC | PRN

## 2015-02-12 NOTE — Progress Notes (Signed)
   Subjective:    Patient ID: Manuel Torres, male    DOB: 02-24-1961, 54 y.o.   MRN: [id number]  Cough This is a new problem. The current episode started in the past 7 days. Associated symptoms include a fever and nasal congestion. Associated symptoms comments: diarrhea. He has tried OTC cough suppressant for the symptoms.    Had fever t max of 100  A lot of cough  Congestion  achey in sides and   Energy level down  Loose stools staring last night    Appetite diminished  Review of Systems  Constitutional: Positive for fever.  Respiratory: Positive for cough.        Objective:   Physical Exam Alert mild malaise. H&T moderate his congestion pharynx normal neck supple. Lungs intermittent bronchial cough heart regular in rhythm frontal tenderness       Assessment & Plan:  Impression post flu sinusitis/bronchitis plan antibiotics prescribed. Symptomatic care discussed. Warning signs discussed WSL

## 2015-07-25 ENCOUNTER — Other Ambulatory Visit: Payer: Self-pay | Admitting: Family Medicine

## 2015-08-24 ENCOUNTER — Other Ambulatory Visit: Payer: Self-pay | Admitting: Family Medicine

## 2015-09-09 ENCOUNTER — Telehealth: Payer: Self-pay | Admitting: Family Medicine

## 2015-09-09 NOTE — Telephone Encounter (Signed)
Pt needs Placard filled out please to return to [facility]

## 2015-09-10 NOTE — Telephone Encounter (Signed)
[facility], LM to pick up

## 2015-09-10 NOTE — Telephone Encounter (Signed)
completed

## 2015-09-23 ENCOUNTER — Other Ambulatory Visit: Payer: Self-pay | Admitting: Family Medicine

## 2015-09-23 NOTE — Telephone Encounter (Signed)
May have this in one additional refill needs follow-up office visit before the end of the year

## 2015-10-23 ENCOUNTER — Other Ambulatory Visit: Payer: Self-pay | Admitting: Family Medicine

## 2015-10-23 NOTE — Telephone Encounter (Signed)
Last chronic visit on June 2015

## 2015-10-24 NOTE — Telephone Encounter (Signed)
May have one refill only please mail a card to the patient that this is his absolute last refill unless he comes in

## 2015-10-29 ENCOUNTER — Encounter: Payer: Self-pay | Admitting: Family Medicine

## 2015-10-29 ENCOUNTER — Ambulatory Visit (INDEPENDENT_AMBULATORY_CARE_PROVIDER_SITE_OTHER): Payer: 59 | Admitting: Family Medicine

## 2015-10-29 ENCOUNTER — Ambulatory Visit (HOSPITAL_COMMUNITY)
Admission: RE | Admit: 2015-10-29 | Discharge: 2015-10-29 | Disposition: A | Discharge status: Home / Self Care | Payer: 59 | Source: Ambulatory Visit | Attending: Family Medicine | Admitting: Family Medicine

## 2015-10-29 VITALS — BP 134/88 | Ht 71.0 in | Wt 267.0 lb

## 2015-10-29 DIAGNOSIS — M545 Low back pain: Secondary | ICD-10-CM | POA: Diagnosis present

## 2015-10-29 DIAGNOSIS — M2578 Osteophyte, vertebrae: Secondary | ICD-10-CM | POA: Insufficient documentation

## 2015-10-29 DIAGNOSIS — M5136 Other intervertebral disc degeneration, lumbar region: Secondary | ICD-10-CM | POA: Insufficient documentation

## 2015-10-29 DIAGNOSIS — M5440 Lumbago with sciatica, unspecified side: Secondary | ICD-10-CM

## 2015-10-29 DIAGNOSIS — M160 Bilateral primary osteoarthritis of hip: Secondary | ICD-10-CM

## 2015-10-29 DIAGNOSIS — M1A0790 Idiopathic chronic gout, unspecified ankle and foot, without tophus (tophi): Secondary | ICD-10-CM | POA: Diagnosis not present

## 2015-10-29 DIAGNOSIS — M1612 Unilateral primary osteoarthritis, left hip: Secondary | ICD-10-CM | POA: Insufficient documentation

## 2015-10-29 DIAGNOSIS — M5134 Other intervertebral disc degeneration, thoracic region: Secondary | ICD-10-CM | POA: Insufficient documentation

## 2015-10-29 MED ORDER — HYDROCODONE-ACETAMINOPHEN 7.5-325 MG PO TABS: 1.0000 | ORAL_TABLET | Freq: Four times a day (QID) | ORAL | Status: DC | PRN

## 2015-10-29 MED ORDER — MELOXICAM 15 MG PO TABS: 15.0000 mg | ORAL_TABLET | Freq: Every day | ORAL | Status: DC

## 2015-10-29 NOTE — Progress Notes (Signed)
   Subjective:    Patient ID: Manuel Torres, male    DOB: 1961-10-18, 54 y.o.   MRN: [id number]  Back Pain This is a new problem. The current episode started 1 to 4 weeks ago. The problem occurs constantly. The problem is unchanged. The pain is present in the lumbar spine. The quality of the pain is described as aching. Radiates to: right and left hip. The pain is at a severity of 9/10. The pain is moderate. The pain is the same all the time. Stiffness is present all day. He has tried NSAIDs for the symptoms. The treatment provided no relief.   Patient has no other concerns today.   patient has a history of arthritis in her right hip replacement Review of Systems  Musculoskeletal: Positive for back pain.   occasionally the pain radiates into thighs but not all the way down the leg does relate left hip pain discomfort he also relates low back pains and spasms hurts with certain     Objective:   Physical Exam  subjective discomfort in the low mid back also in the left lower back also left hip region decreased range of motion of the left hip very tight hamstrings a.m. Subjective discomfort in the low back with straight leg raise       Assessment & Plan:   significant lumbar pain to do x-rays of lumbar spine he has degenerative condition with arthralgias recommend checking some lab work if sedimentation rate or C-reactive protein elevated then possible referral to rheumatology  Left hip pain limited range of motion x-ray indicated may need surgery at some point   Pain medication caution drowsiness home use only    anti-inflammatory 1 daily off on all other anti-inflammatories if it causes stomach pain. It use it over the next few weeks then stop   gentle range of motion exercises shown

## 2015-10-30 ENCOUNTER — Encounter: Payer: Self-pay | Admitting: Family Medicine

## 2015-10-30 LAB — BASIC METABOLIC PANEL
BUN / CREAT RATIO: 14 (ref 9–20)
BUN: 12 mg/dL (ref 6–24)
CHLORIDE: 100 mmol/L (ref 96–106)
CO2: 27 mmol/L (ref 18–29)
Calcium: 9.1 mg/dL (ref 8.7–10.2)
Creatinine, Ser: 0.84 mg/dL (ref 0.76–1.27)
GFR calc Af Amer: 114 mL/min/{1.73_m2} (ref >59)
GFR calc non Af Amer: 99 mL/min/{1.73_m2} (ref >59)
GLUCOSE: 105 mg/dL — AB (ref 65–99)
Potassium: 4 mmol/L (ref 3.5–5.2)
SODIUM: 141 mmol/L (ref 134–144)

## 2015-10-30 LAB — C-REACTIVE PROTEIN: CRP: 4.8 mg/L (ref 0.0–4.9)

## 2015-10-30 LAB — SEDIMENTATION RATE: Sed Rate: 18 mm/hr (ref 0–30)

## 2015-10-30 LAB — URIC ACID: URIC ACID: 6.3 mg/dL (ref 3.7–8.6)

## 2015-11-11 ENCOUNTER — Telehealth: Payer: Self-pay | Admitting: Family Medicine

## 2015-11-11 ENCOUNTER — Other Ambulatory Visit: Payer: Self-pay | Admitting: Family Medicine

## 2015-11-11 DIAGNOSIS — M25559 Pain in unspecified hip: Secondary | ICD-10-CM

## 2015-11-11 DIAGNOSIS — M549 Dorsalgia, unspecified: Secondary | ICD-10-CM

## 2015-11-11 MED ORDER — HYDROCODONE-ACETAMINOPHEN 7.5-325 MG PO TABS: 1.0000 | ORAL_TABLET | Freq: Four times a day (QID) | ORAL | Status: DC | PRN

## 2015-11-11 NOTE — Progress Notes (Signed)
Refill on pain med for hip per pt request

## 2015-11-11 NOTE — Telephone Encounter (Signed)
Patient was seen for back and hip pain on 10/29/2015.  He says that both are still hurting him and he was told to call back if they did not get any better.

## 2015-11-11 NOTE — Telephone Encounter (Signed)
I discussed the case with the patient. Nurse's-please refer the patient to Dr. Ricki Rodriguez in [place]Orthopedics. Also I gave patient a renewal on his hydrocodone. As a courtesy to the patient I dropped the prescription off at [facility]

## 2015-11-12 ENCOUNTER — Encounter: Payer: Self-pay | Admitting: Family Medicine

## 2015-11-12 NOTE — Telephone Encounter (Signed)
Referral put in.

## 2015-11-22 ENCOUNTER — Other Ambulatory Visit: Payer: Self-pay | Admitting: Family Medicine

## 2015-12-22 ENCOUNTER — Other Ambulatory Visit: Payer: Self-pay | Admitting: Family Medicine

## 2016-01-20 ENCOUNTER — Ambulatory Visit (HOSPITAL_COMMUNITY): Payer: 59 | Attending: Orthopedic Surgery | Admitting: Physical Therapy

## 2016-01-20 DIAGNOSIS — R269 Unspecified abnormalities of gait and mobility: Secondary | ICD-10-CM

## 2016-01-20 DIAGNOSIS — R6889 Other general symptoms and signs: Secondary | ICD-10-CM

## 2016-01-20 DIAGNOSIS — Z7409 Other reduced mobility: Secondary | ICD-10-CM

## 2016-01-20 DIAGNOSIS — M545 Low back pain, unspecified: Secondary | ICD-10-CM

## 2016-01-20 DIAGNOSIS — R29898 Other symptoms and signs involving the musculoskeletal system: Secondary | ICD-10-CM | POA: Insufficient documentation

## 2016-01-20 DIAGNOSIS — M623 Immobility syndrome (paraplegic): Secondary | ICD-10-CM | POA: Insufficient documentation

## 2016-01-20 DIAGNOSIS — M2560 Stiffness of unspecified joint, not elsewhere classified: Secondary | ICD-10-CM

## 2016-01-20 NOTE — Therapy (Signed)
[place], [place], [address] Phone: [phone]   Fax:  [phone]  Physical Therapy Evaluation  Patient Details  Name: Manuel Torres MRN: [id number] Date of Birth: 03-Oct-1961 Referring Provider: Lorayne Marek   Encounter Date: 01/20/2016      PT End of Session - 01/20/16 1604    Visit Number 1   Number of Visits 12   Date for PT Re-Evaluation 02/19/16   Authorization Type [employer]    Authorization - Visit Number 1   Authorization - Number of Visits 10   PT Start Time 1350   PT Stop Time 1432   PT Time Calculation (min) 42 min   Activity Tolerance Patient tolerated treatment well   Behavior During Therapy [facility] for tasks assessed/performed      Past Medical History  Diagnosis Date  . Hypertension   . Hypercholesteremia   . Gout     NO RECENT FLARE UPS  . Arthritis     Hip  . Gout     Past Surgical History  Procedure Laterality Date  . Right inguinal hernia    . Colonoscopy  08/03/2012    Procedure: COLONOSCOPY;  Surgeon: Rogene Houston, MD;  Location: [facility];  Service: Endoscopy;  Laterality: N/A;  830  . Total hip arthroplasty  10/23/2012    Procedure: TOTAL HIP ARTHROPLASTY;  Surgeon: Gearlean Alf, MD;  Location: [facility] ORS;  Service: Orthopedics;  Laterality: Right;  . Joint replacement      r hip Dr. Maureen Ralphs Dec 2013    There were no vitals filed for this visit.  Visit Diagnosis:  Low back pain potentially associated with radiculopathy - Plan: PT plan of care cert/re-cert  Weakness of both legs - Plan: PT plan of care cert/re-cert  Activity intolerance - Plan: PT plan of care cert/re-cert  Abnormality of gait - Plan: PT plan of care cert/re-cert  Stiffness due to immobility - Plan: PT plan of care cert/re-cert      Subjective Assessment - 01/20/16 1349    Subjective Manuel Torres states that he has had increased back and in his left hip. He also has a burning sensation  down both thighs the right being greater than the left.  He had injections on Feb. 22nd with only 10-15% relief.  He states the pain is progressive;  Prior to his back pain he was not using any assistive device to ambulate. He is being referred to skilled physcial therapy.    Pertinent History Rt THR 2013, HTN,    How long can you sit comfortably? able to sit for two hours    How long can you stand comfortably? able to stand for 20 minutes    How long can you walk comfortably? walking with a walker able to walk for 30 minutes    Diagnostic tests MRI:  spinal stenosis    Currently in Pain? Yes   Pain Score 7   highest 9 or 10; least is a 4 or 5    Pain Location Back   Pain Orientation Lower   Pain Descriptors / Indicators Aching   Pain Type Chronic pain   Pain Radiating Towards knees bilaterally    Pain Onset More than a month ago   Pain Frequency Constant   Aggravating Factors  standing   Pain Relieving Factors lie down    Effect of Pain on Daily Activities increases    Multiple Pain Sites Yes  Pain Score 2   Pain Location Hip   Pain Orientation Left   Pain Descriptors / Indicators Dull   Pain Onset More than a month ago   Pain Frequency Constant   Aggravating Factors  walking    Pain Relieving Factors sitting    Effect of Pain on Daily Activities increases             [facility] PT Assessment - 01/20/16 0001    Assessment   Medical Diagnosis Spinal stenosis/Lt bursitis    Referring Provider Lorayne Marek    Onset Date/Surgical Date 11/17/15   Next MD Visit 11/24/2015  Nelva Bush not Rolena Infante    Prior Therapy not for this condition    Precautions   Precautions Fall   Restrictions   Weight Bearing Restrictions No   Balance Screen   Has the patient fallen in the past 6 months Yes   How many times? 3   Has the patient had a decrease in activity level because of a fear of falling?  Yes   Is the patient reluctant to leave their home because of a fear of falling?  No   Home  Environment   Living Environment Private residence   Type of [place] to enter   Entrance Stairs-Number of Steps 1   [place] to live on main level with bedroom/bathroom   Prior Function   Vocation Full time employment   Vocation Requirements on feet all the time lifting up to 100 feet with assist    Leisure walking, golfing    Observation/Other Assessments   Focus on Therapeutic Outcomes (FOTO)  40   ROM / Strength   AROM / PROM / Strength AROM;Strength   AROM   AROM Assessment Site Lumbar   Lumbar Flexion 40   Lumbar Extension 10 with increased pain    Strength   Strength Assessment Site Hip;Knee;Ankle   Right/Left Hip Right;Left   Right Hip Flexion 3-/5   Right Hip Extension 3-/5   Right Hip ABduction 3-/5   Left Hip Flexion 2+/5   Left Hip Extension 2/5   Left Hip ABduction 2+/5   Right/Left Knee Right;Left   Right Knee Extension 4+/5   Left Knee Extension 4/5   Right/Left Ankle Right;Left   Right Ankle Dorsiflexion 5/5   Left Ankle Dorsiflexion 5/5   Transfers   Five time sit to stand comments  unable to come sit to stand without using UE    6 Minute Walk- Baseline   6 Minute Walk- Baseline --  only able to complete 5 minutes due to hip pan;876 feet.                    [facility] Adult PT Treatment/Exercise - 01/20/16 0001    Exercises   Exercises Lumbar   Lumbar Exercises: Supine   Ab Set 5 reps   Bent Knee Raise 5 reps   Bridge 5 reps   Lumbar Exercises: Sidelying   Hip Abduction 5 reps                PT Education - 01/20/16 1604    Education provided Yes   Education Details HEP for decompression and beginning of stabilization    Person(s) Educated Patient   Methods Explanation;Handout;Tactile cues;Verbal cues   Comprehension Verbalized understanding;Returned demonstration          PT Short Term Goals - 01/20/16 1612    PT SHORT TERM GOAL #1  Title Pt will improve trunk/hip  ROM to be able to pick an  item off the ground    Time 4   Period Weeks   Status New   PT SHORT TERM GOAL #2   Title Pt will improve strength by one grade to allow pt to feel confident walking with a cane    Time 4   Period Weeks   Status New   PT SHORT TERM GOAL #3   Title Pt will report a 50% decrease in pain to allow improved sleeping    Time 4   Period Weeks   Status New   PT SHORT TERM GOAL #4   Title Pt will be independent in HEP to allow the above to ocur in a timely manner.    Time 4   Period Weeks   Status New           PT Long Term Goals - 01/20/16 1626    PT LONG TERM GOAL #1   Title Pt strength will be improved to 5/5 to allow him to walk without an assistive device   Time 8   Period Weeks   Status New   PT LONG TERM GOAL #2   Title PT to be able to demonstrate good body mechanics while lifting for protecion of his low back    Time 8   Period Weeks   PT LONG TERM GOAL #3   Title Pt pain level to decrease to no greater than a 3/10 80% of the day to be able to return to work    Time 8   Period Weeks   Status New   PT LONG TERM GOAL #4   Title PT to be able to come sit to stand without the use of his Upper extremities   Time 8   Period Weeks   Status New   PT LONG TERM GOAL #5   Title Pt to be able to walk 1000 ft without assistive device in six minutes to be able to ambulate in the community with ease    Time 8   Period Weeks   Status New               Plan - 01/20/16 1606    Clinical Impression Statement Manuel Torres is a 55 yo who has been having progressive back and Lt hip pain for the past two months. MRI showed spinal stenosis as well as hip bursitis.  He normally does not use and assistive device to for ambulation but feels he needs a walker for safety at this time as he states that both knees will give way without warning.  He has been referred to skilled physical therapy.  Examination demonstrates decreased ROM, decreased strength, decreased activtiy toleranc,  abnormal gait and increased pain.  He will benefit from skilled PT to adress these issues and maximize his functional ability.     Pt will benefit from skilled therapeutic intervention in order to improve on the following deficits Abnormal gait;Decreased activity tolerance;Difficulty walking;Decreased balance;Pain;Decreased strength;Decreased range of motion   Rehab Potential Good   PT Frequency 3x / week   PT Duration 4 weeks   PT Treatment/Interventions Electrical Stimulation;Cryotherapy;Gait training;Stair training;Functional mobility training;Therapeutic activities;Therapeutic exercise;Balance training;Patient/family education;Manual techniques;Moist Heat;Traction;Ultrasound   PT Next Visit Plan Begin stretches for low back and progress pt through stabilization program.  Prone exercises must have pillows under stomach(2-3).    PT Home Exercise Plan given   Consulted and Agree with Plan of  Care Patient         Problem List Patient Active Problem List   Diagnosis Date Noted  . Morbid obesity ([place]) 01/24/2015  . Metabolic syndrome [id number]  . Essential hypertension, benign 04/19/2013  . Hyperlipemia 04/19/2013  . Prediabetes 04/19/2013  . Gout 04/19/2013  . Elevated transaminase level 04/19/2013  . Difficulty in walking(719.7) 11/14/2012  . Weakness of right leg 11/14/2012  . Postop Hyponatremia 10/24/2012  . OA (osteoarthritis) of hip 10/23/2012  . NONSPEC ELEVATION OF LEVELS OF TRANSAMINASE/LDH 05/20/2010   Rayetta Humphrey, PT CLT [phone] 01/20/2016, 4:27 PM  [place] [address] [place], [place], [address] Phone: [phone]   Fax:  [phone]  Name: Manuel Torres MRN: [id number] Date of Birth: 23-Jan-1961

## 2016-01-20 NOTE — Patient Instructions (Signed)
Isometric Abdominal    Lying on back with knees bent, tighten stomach by pressing elbows down. Hold __3__ seconds. Repeat _10___ times per set. Do ___1_ sets per session. Do _5___ sessions per day.  http://orth.exer.us/1086   Copyright  VHI. All rights reserved.  Bridging    Slowly raise buttocks from floor, keeping stomach tight. Repeat _10___ times per set. Do 1____ sets per session. Do _2___ sessions per day.  http://orth.exer.us/1096   Copyright  VHI. All rights reserved.  Bent Leg Lift (Hook-Lying)    Tighten stomach and slowly raise right leg __3__ inches from floor. Keep trunk rigid. Hold _3___ seconds. Repeat _10___ times per set. Do _1___ sets per session. Do __2__ sessions per day.  http://orth.exer.us/1090   Copyright  VHI. All rights reserved.  Strengthening: Hip Abduction (Side-Lying)    Tighten muscles on front of left thigh, then lift leg _12___ inches from surface, keeping knee locked.  Repeat _10___ times per set. Do _1___ sets per session. Do _2___ sessions per day. Repeat to the right  http://orth.exer.us/622   Copyright  VHI. All rights reserved.

## 2016-01-21 ENCOUNTER — Other Ambulatory Visit: Payer: Self-pay | Admitting: Family Medicine

## 2016-01-23 ENCOUNTER — Ambulatory Visit (HOSPITAL_COMMUNITY): Payer: 59 | Admitting: Physical Therapy

## 2016-01-23 DIAGNOSIS — M545 Low back pain, unspecified: Secondary | ICD-10-CM

## 2016-01-23 DIAGNOSIS — Z7409 Other reduced mobility: Secondary | ICD-10-CM

## 2016-01-23 DIAGNOSIS — R6889 Other general symptoms and signs: Secondary | ICD-10-CM

## 2016-01-23 DIAGNOSIS — R29898 Other symptoms and signs involving the musculoskeletal system: Secondary | ICD-10-CM

## 2016-01-23 DIAGNOSIS — R269 Unspecified abnormalities of gait and mobility: Secondary | ICD-10-CM

## 2016-01-23 DIAGNOSIS — M2560 Stiffness of unspecified joint, not elsewhere classified: Secondary | ICD-10-CM

## 2016-01-23 NOTE — Patient Instructions (Signed)
Gluteal Sets    Tighten buttocks while pressing pelvis to floor. Hold _3___ seconds. Repeat _10___ times per set. Do 1____ sets per session. Do __2__ sessions per day.  http://orth.exer.us/104   Copyright  VHI. All rights reserved.  Heel Squeeze (Prone)    Abdomen supported, bend knees and gently squeeze heels together. Hold ___3_ seconds. Repeat ___10_ times per set. Do _1___ sets per session. Do __2__ sessions per day.  http://orth.exer.us/1080   Copyright  VHI. All rights reserved.  Straight Leg Raise (Prone)    Abdomen and head supported, keep left knee locked and raise leg at hip. Avoid arching low back. Repeat __10__ times per set. Do __1__ sets per session. Do _2___ sessions per day.  http://orth.exer.us/1112   Copyright  VHI. All rights reserved.  Hamstring Stretch: Active    Support behind right knee. Starting with knee bent, attempt to straighten knee until a comfortable stretch is felt in back of thigh. Hold __30__ seconds. Repeat _3___ times per set. Do _1___ sets per session. Do ____2 sessions per day.  http://orth.exer.us/158   Copyright  VHI. All rights reserved.

## 2016-01-23 NOTE — Therapy (Signed)
[place], [place], [address] Phone: [phone]   Fax:  [phone]  Physical Therapy Treatment  Patient Details  Name: Manuel Torres MRN: [id number] Date of Birth: 1961-02-11 Referring Provider: Lorayne Marek   Encounter Date: 01/23/2016      PT End of Session - 01/23/16 1407    Visit Number 2   Number of Visits 12   Date for PT Re-Evaluation 02/19/16   Authorization Type united [employer]    Authorization - Visit Number 2   Authorization - Number of Visits 10   PT Start Time [id number]   PT Stop Time 1425   PT Time Calculation (min) 38 min   Activity Tolerance Patient tolerated treatment well      Past Medical History  Diagnosis Date  . Hypertension   . Hypercholesteremia   . Gout     NO RECENT FLARE UPS  . Arthritis     Hip  . Gout     Past Surgical History  Procedure Laterality Date  . Right inguinal hernia    . Colonoscopy  08/03/2012    Procedure: COLONOSCOPY;  Surgeon: Rogene Houston, MD;  [facility];  Service: Endoscopy;  Laterality: N/A;  830  . Total hip arthroplasty  10/23/2012    Procedure: TOTAL HIP ARTHROPLASTY;  Surgeon: Gearlean Alf, MD;  Location: [facility];  Service: Orthopedics;  Laterality: Right;  . Joint replacement      r hip Dr. Maureen Ralphs Dec 2013    There were no vitals filed for this visit.  Visit Diagnosis:  Low back pain potentially associated with radiculopathy  Weakness of both legs  Activity intolerance  Abnormality of gait  Stiffness due to immobility      Subjective Assessment - 01/23/16 1353    Subjective Manuel Torres state that he feels that he might have over done the exercies due to being sore.  He has no questions at this time.   He is being scheduled to have another injection.    Pertinent History Rt THR 2013, HTN,    How long can you sit comfortably? able to sit for two hours    How long can you stand comfortably? able to stand for 20  minutes    How long can you walk comfortably? walking with a walker able to walk for 30 minutes    Diagnostic tests MRI:  spinal stenosis    Currently in Pain? Yes   Pain Score 6    Pain Location Back   Pain Orientation Lower   Pain Descriptors / Indicators Aching   Pain Type Chronic pain   Pain Onset More than a month ago   Pain Frequency Constant   Aggravating Factors  standing     Pain Relieving Factors lying down                 [facility] Adult PT Treatment/Exercise - 01/23/16 0001    Exercises   Exercises Lumbar   Lumbar Exercises: Stretches   Active Hamstring Stretch 3 reps;30 seconds   Single Knee to Chest Stretch --   Lumbar Exercises: Standing   Other Standing Lumbar Exercises 3 D hip excursion x 3    Lumbar Exercises: Supine   Clam 5 reps   Bent Knee Raise 10 reps   Bridge 10 reps   Lumbar Exercises: Sidelying   Hip Abduction 10 reps   Lumbar Exercises: Prone   Straight Leg Raise 10 reps  Other Prone Lumbar Exercises glut set/ heel squeeze x 10 each                 PT Education - 01/23/16 1406    Education provided Yes   Education Details updated HEP    Person(s) Educated Patient   Methods Explanation   Comprehension Verbalized understanding          PT Short Term Goals - 01/23/16 1428    PT SHORT TERM GOAL #1   Title Pt will improve trunk/hip  ROM to be able to pick an item off the ground    Time 4   Period Weeks   Status On-going   PT SHORT TERM GOAL #2   Title Pt will improve strength by one grade to allow pt to feel confident walking with a cane    Time 4   Period Weeks   Status On-going   PT SHORT TERM GOAL #3   Title Pt will report a 50% decrease in pain to allow improved sleeping    Time 4   Period Weeks   Status On-going   PT SHORT TERM GOAL #4   Title Pt will be independent in HEP to allow the above to ocur in a timely manner.    Time 4   Period Weeks   Status On-going           PT Long Term Goals - 01/23/16 1429     PT LONG TERM GOAL #1   Title Pt strength will be improved to 5/5 to allow him to walk without an assistive device   Time 8   Period Weeks   Status On-going   PT LONG TERM GOAL #2   Title PT to be able to demonstrate good body mechanics while lifting for protecion of his low back    Time 8   Status On-going   PT LONG TERM GOAL #3   Title Pt pain level to decrease to no greater than a 3/10 80% of the day to be able to return to work    Time 8   Status On-going   PT LONG TERM GOAL #4   Title PT to be able to come sit to stand without the use of his Upper extremities   Time 8   Period Weeks   Status On-going   PT LONG TERM GOAL #5   Title Pt to be able to walk 1000 ft without assistive device in six minutes to be able to ambulate in the community with ease    Time 8   Period Weeks   Status On-going   Additional Long Term Goals   Additional Long Term Goals Yes   PT LONG TERM GOAL #6   Title Pt to be able to go up and down two flights of steps with no increased pain to be able to complete work activities    Time 8   Period Weeks   Status New               Plan - 01/23/16 1407    Clinical Impression Statement Evaluation and goals reviewed with pt.  Therapist instructed pt in new stabilization exercises while prone with verbal cuing for proper technique.     PT Next Visit Plan beginsupine decompression exercises with theraband; wall squats with ball on wall          Problem List Patient Active Problem List   Diagnosis Date Noted  . Morbid obesity ([place]) 01/24/2015  .  Metabolic syndrome [id number]  . Essential hypertension, benign 04/19/2013  . Hyperlipemia 04/19/2013  . Prediabetes 04/19/2013  . Gout 04/19/2013  . Elevated transaminase level 04/19/2013  . Difficulty in walking(719.7) 11/14/2012  . Weakness of right leg 11/14/2012  . Postop Hyponatremia 10/24/2012  . OA (osteoarthritis) of hip 10/23/2012  . NONSPEC ELEVATION OF LEVELS OF TRANSAMINASE/LDH  05/20/2010    Rayetta Humphrey, PT CLT [phone] 01/23/2016, 2:31 PM  [facility] [address] [place], [place], [address] Phone: [phone]   Fax:  [phone]  Name: Manuel Torres MRN: [id number] Date of Birth: 10-24-1961

## 2016-02-02 ENCOUNTER — Ambulatory Visit (HOSPITAL_COMMUNITY): Payer: 59 | Admitting: Physical Therapy

## 2016-02-02 DIAGNOSIS — Z7409 Other reduced mobility: Secondary | ICD-10-CM

## 2016-02-02 DIAGNOSIS — M2560 Stiffness of unspecified joint, not elsewhere classified: Secondary | ICD-10-CM

## 2016-02-02 DIAGNOSIS — M545 Low back pain, unspecified: Secondary | ICD-10-CM

## 2016-02-02 DIAGNOSIS — R29898 Other symptoms and signs involving the musculoskeletal system: Secondary | ICD-10-CM

## 2016-02-02 DIAGNOSIS — R269 Unspecified abnormalities of gait and mobility: Secondary | ICD-10-CM

## 2016-02-02 DIAGNOSIS — R6889 Other general symptoms and signs: Secondary | ICD-10-CM

## 2016-02-02 NOTE — Therapy (Signed)
[place], [place], [address] Phone: [phone]   Fax:  [phone]  Physical Therapy Treatment  Patient Details  Name: Manuel Torres MRN: [id number] Date of Birth: Mar 29, 1961 Referring Provider: Lorayne Marek   Encounter Date: 02/02/2016      PT End of Session - 02/02/16 1415    Visit Number 3   Number of Visits 12   Date for PT Re-Evaluation 02/19/16   Authorization Type united healthcare    Authorization - Visit Number 3   Authorization - Number of Visits 10   PT Start Time [id number]  pt was 10 minutes late   PT Stop Time 1435   PT Time Calculation (min) 40 min   Activity Tolerance Patient tolerated treatment well   Behavior During Therapy [facility] for tasks assessed/performed      Past Medical History  Diagnosis Date  . Hypertension   . Hypercholesteremia   . Gout     NO RECENT FLARE UPS  . Arthritis     Hip  . Gout     Past Surgical History  Procedure Laterality Date  . Right inguinal hernia    . Colonoscopy  08/03/2012    Procedure: COLONOSCOPY;  Surgeon: Rogene Houston, MD;  [facility];  Service: Endoscopy;  Laterality: N/A;  830  . Total hip arthroplasty  10/23/2012    Procedure: TOTAL HIP ARTHROPLASTY;  Surgeon: Gearlean Alf, MD;  Location: [facility];  Service: Orthopedics;  Laterality: Right;  . Joint replacement      r hip Dr. Maureen Ralphs Dec 2013    There were no vitals filed for this visit.  Visit Diagnosis:  Low back pain potentially associated with radiculopathy  Stiffness due to immobility  Abnormality of gait  Activity intolerance  Weakness of both legs      Subjective Assessment - 02/02/16 1407    Subjective PT states he is still awaiting an appt to get another injection.  States he currently has 4/10 pain today and hurts worse in his hips Lt>Rt.  STates has some burning sensation in his LB.  Continues to use RW because of fear of LE buckling.   Currently in Pain?  Yes   Pain Score 4    Pain Location Hip   Pain Orientation Left;Right   Pain Descriptors / Indicators Aching;Burning                         [facility] Adult PT Treatment/Exercise - 02/02/16 1409    Lumbar Exercises: Supine   Clam 10 reps   Bent Knee Raise 10 reps   Bridge 10 reps   Other Supine Lumbar Exercises decompression exercises 1-5 5 reps 5" holds each   Lumbar Exercises: Sidelying   Hip Abduction 10 reps   Lumbar Exercises: Prone   Straight Leg Raise 10 reps   Other Prone Lumbar Exercises glut set/ heel squeeze x 10 each                 PT Education - 02/02/16 1419    Education provided Yes   Education Details decompression exercises #1-5   Person(s) Educated Patient   Methods Handout   Comprehension Verbalized understanding;Returned demonstration;Tactile cues required;Need further instruction;Verbal cues required          PT Short Term Goals - 01/23/16 1428    PT SHORT TERM GOAL #1   Title Pt will improve trunk/hip  ROM to  be able to pick an item off the ground    Time 4   Period Weeks   Status On-going   PT SHORT TERM GOAL #2   Title Pt will improve strength by one grade to allow pt to feel confident walking with a cane    Time 4   Period Weeks   Status On-going   PT SHORT TERM GOAL #3   Title Pt will report a 50% decrease in pain to allow improved sleeping    Time 4   Period Weeks   Status On-going   PT SHORT TERM GOAL #4   Title Pt will be independent in HEP to allow the above to ocur in a timely manner.    Time 4   Period Weeks   Status On-going           PT Long Term Goals - 01/23/16 1429    PT LONG TERM GOAL #1   Title Pt strength will be improved to 5/5 to allow him to walk without an assistive device   Time 8   Period Weeks   Status On-going   PT LONG TERM GOAL #2   Title PT to be able to demonstrate good body mechanics while lifting for protecion of his low back    Time 8   Status On-going   PT LONG TERM GOAL  #3   Title Pt pain level to decrease to no greater than a 3/10 80% of the day to be able to return to work    Time 8   Status On-going   PT LONG TERM GOAL #4   Title PT to be able to come sit to stand without the use of his Upper extremities   Time 8   Period Weeks   Status On-going   PT LONG TERM GOAL #5   Title Pt to be able to walk 1000 ft without assistive device in six minutes to be able to ambulate in the community with ease    Time 8   Period Weeks   Status On-going   Additional Long Term Goals   Additional Long Term Goals Yes   PT LONG TERM GOAL #6   Title Pt to be able to go up and down two flights of steps with no increased pain to be able to complete work activities    Time 8   Period Weeks   Status New               Plan - 02/02/16 1415    Clinical Impression Statement Pt was 10 minutes late for appt. Overall reduced pain as compared to previous session.  Pt reports compliance with HEP. PRogresseed reps and added decompression exercises today.  Pt with good control completing therex with minimal cues other than form.     PT Next Visit Plan Continue to progress core stability and strength. Review exericises to ensure correct form, specifically decompression exercises.  Begin decompression exercises with theraband         Problem List Patient Active Problem List   Diagnosis Date Noted  . Morbid obesity ([place]) 01/24/2015  . Metabolic syndrome [id number]  . Essential hypertension, benign 04/19/2013  . Hyperlipemia 04/19/2013  . Prediabetes 04/19/2013  . Gout 04/19/2013  . Elevated transaminase level 04/19/2013  . Difficulty in walking(719.7) 11/14/2012  . Weakness of right leg 11/14/2012  . Postop Hyponatremia 10/24/2012  . OA (osteoarthritis) of hip 10/23/2012  . NONSPEC ELEVATION OF LEVELS OF TRANSAMINASE/LDH 05/20/2010  Teena Irani, PTA/CLT [phone] 02/02/2016, 2:32 PM  [place], [place], [address] Phone: [phone]   Fax:  [phone]  Name: WILLIOM ARROWSMITH MRN: [id number] Date of Birth: 05/04/1961

## 2016-02-04 ENCOUNTER — Ambulatory Visit (HOSPITAL_COMMUNITY): Payer: 59

## 2016-02-04 DIAGNOSIS — R269 Unspecified abnormalities of gait and mobility: Secondary | ICD-10-CM

## 2016-02-04 DIAGNOSIS — R6889 Other general symptoms and signs: Secondary | ICD-10-CM

## 2016-02-04 DIAGNOSIS — M2560 Stiffness of unspecified joint, not elsewhere classified: Secondary | ICD-10-CM

## 2016-02-04 DIAGNOSIS — M545 Low back pain, unspecified: Secondary | ICD-10-CM

## 2016-02-04 DIAGNOSIS — Z7409 Other reduced mobility: Secondary | ICD-10-CM

## 2016-02-04 DIAGNOSIS — R29898 Other symptoms and signs involving the musculoskeletal system: Secondary | ICD-10-CM

## 2016-02-04 NOTE — Therapy (Signed)
[place], [place], [address] Phone: [phone]   Fax:  [phone]  Physical Therapy Treatment  Patient Details  Name: Manuel Torres MRN: [id number] Date of Birth: 04-18-1961 Referring Provider: Lorayne Marek   Encounter Date: 02/04/2016      PT End of Session - 02/04/16 1436    Visit Number 4   Number of Visits 12   Date for PT Re-Evaluation 02/19/16   Authorization Type united healthcare    Authorization - Visit Number 4   Authorization - Number of Visits 10   PT Start Time [id number]   PT Stop Time 1435   PT Time Calculation (min) 42 min   Activity Tolerance Patient tolerated treatment well;No increased pain   Behavior During Therapy [facility] for tasks assessed/performed      Past Medical History  Diagnosis Date  . Hypertension   . Hypercholesteremia   . Gout     NO RECENT FLARE UPS  . Arthritis     Hip  . Gout     Past Surgical History  Procedure Laterality Date  . Right inguinal hernia    . Colonoscopy  08/03/2012    Procedure: COLONOSCOPY;  Surgeon: Rogene Houston, MD;  [facility];  Service: Endoscopy;  Laterality: N/A;  830  . Total hip arthroplasty  10/23/2012    Procedure: TOTAL HIP ARTHROPLASTY;  Surgeon: Gearlean Alf, MD;  [facility];  Service: Orthopedics;  Laterality: Right;  . Joint replacement      r hip Dr. Maureen Ralphs Dec 2013    There were no vitals filed for this visit.  Visit Diagnosis:  Low back pain potentially associated with radiculopathy  Stiffness due to immobility  Abnormality of gait  Activity intolerance  Weakness of both legs      Subjective Assessment - 02/04/16 1356    Subjective Pt reports his pain remains stable 4/10, burning into right thigh and lateral knee.    Pertinent History Rt THR 2013, HTN,    How long can you sit comfortably? able to sit for two hours    How long can you stand comfortably? able to stand for 20 minutes    How long  can you walk comfortably? walking with a walker able to walk for 30 minutes    Diagnostic tests MRI:  spinal stenosis    Currently in Pain? Yes   Pain Score 4    Pain Location Leg  R    Pain Orientation Right   Pain Descriptors / Indicators Burning   Pain Type Chronic pain                         [facility] Adult PT Treatment/Exercise - 02/04/16 0001    Lumbar Exercises: Supine   Bridge 10 reps  3x10 c ball squeeze   Other Supine Lumbar Exercises Abduction heel slides  3x10 bilat   Other Supine Lumbar Exercises Quadruped hip extension, 2x10 bilat   L hip internal rotation from faber, manually resisted.    Lumbar Exercises: Quadruped   Plank standing closed chain L hip internal roation SLS  10x3s   Manual Therapy   Manual therapy comments L anterolateral hip release, ITB, MFR  lateral femoral cutaneous nerve mobilization x5 minutes                PT Education - 02/04/16 1422    Education provided Yes   Education Details educated  on sleeping with 1-2 pillows between knees to alleviate R hip burning pain.    Person(s) Educated Patient   Methods Explanation   Comprehension Verbalized understanding          PT Short Term Goals - 01/23/16 1428    PT SHORT TERM GOAL #1   Title Pt will improve trunk/hip  ROM to be able to pick an item off the ground    Time 4   Period Weeks   Status On-going   PT SHORT TERM GOAL #2   Title Pt will improve strength by one grade to allow pt to feel confident walking with a cane    Time 4   Period Weeks   Status On-going   PT SHORT TERM GOAL #3   Title Pt will report a 50% decrease in pain to allow improved sleeping    Time 4   Period Weeks   Status On-going   PT SHORT TERM GOAL #4   Title Pt will be independent in HEP to allow the above to ocur in a timely manner.    Time 4   Period Weeks   Status On-going           PT Long Term Goals - 01/23/16 1429    PT LONG TERM GOAL #1   Title Pt strength will be  improved to 5/5 to allow him to walk without an assistive device   Time 8   Period Weeks   Status On-going   PT LONG TERM GOAL #2   Title PT to be able to demonstrate good body mechanics while lifting for protecion of his low back    Time 8   Status On-going   PT LONG TERM GOAL #3   Title Pt pain level to decrease to no greater than a 3/10 80% of the day to be able to return to work    Time 8   Status On-going   PT LONG TERM GOAL #4   Title PT to be able to come sit to stand without the use of his Upper extremities   Time 8   Period Weeks   Status On-going   PT LONG TERM GOAL #5   Title Pt to be able to walk 1000 ft without assistive device in six minutes to be able to ambulate in the community with ease    Time 8   Period Weeks   Status On-going   Additional Long Term Goals   Additional Long Term Goals Yes   PT LONG TERM GOAL #6   Title Pt to be able to go up and down two flights of steps with no increased pain to be able to complete work activities    Time 8   Period Weeks   Status New               Plan - 02/04/16 1436    Clinical Impression Statement Pt late for appt. Pt responding well to R MFR for full resolution of nerve pain, and L posterior glute medius isolation for 75% pain resolution.Making good progres toward goals, and indpe in HEP. Will continue to follow up on new sleeping position suggestions.    Pt will benefit from skilled therapeutic intervention in order to improve on the following deficits Abnormal gait;Decreased activity tolerance;Difficulty walking;Decreased balance;Pain;Decreased strength;Decreased range of motion   Rehab Potential Good   PT Frequency 3x / week   PT Duration 4 weeks   PT Treatment/Interventions Electrical Stimulation;Cryotherapy;Gait training;Stair training;Functional mobility  training;Therapeutic activities;Therapeutic exercise;Balance training;Patient/family education;Manual techniques;Moist Heat;Traction;Ultrasound   PT Next  Visit Plan Continue to progress core stability and strength. Review exericises to ensure correct form, specifically decompression exercises.  Begin decompression exercises with theraband    PT Home Exercise Plan Reviewed.    Consulted and Agree with Plan of Care Patient        Problem List Patient Active Problem List   Diagnosis Date Noted  . Morbid obesity ([place]) 01/24/2015  . Metabolic syndrome [id number]  . Essential hypertension, benign 04/19/2013  . Hyperlipemia 04/19/2013  . Prediabetes 04/19/2013  . Gout 04/19/2013  . Elevated transaminase level 04/19/2013  . Difficulty in walking(719.7) 11/14/2012  . Weakness of right leg 11/14/2012  . Postop Hyponatremia 10/24/2012  . OA (osteoarthritis) of hip 10/23/2012  . NONSPEC ELEVATION OF LEVELS OF TRANSAMINASE/LDH 05/20/2010   2:41 PM, 02/04/2016 Etta Grandchild, PT, DPT PRN Physical Therapist at [place] # [id number] (wireless)  [phone] (mobile)       [place], [place], [address] Phone: [phone]   Fax:  [phone]  Name: Manuel Torres MRN: [id number] Date of Birth: October 12, 1961

## 2016-02-06 ENCOUNTER — Ambulatory Visit (HOSPITAL_COMMUNITY): Payer: 59

## 2016-02-06 DIAGNOSIS — R6889 Other general symptoms and signs: Secondary | ICD-10-CM

## 2016-02-06 DIAGNOSIS — M545 Low back pain, unspecified: Secondary | ICD-10-CM

## 2016-02-06 DIAGNOSIS — R269 Unspecified abnormalities of gait and mobility: Secondary | ICD-10-CM

## 2016-02-06 DIAGNOSIS — M2560 Stiffness of unspecified joint, not elsewhere classified: Secondary | ICD-10-CM

## 2016-02-06 DIAGNOSIS — Z7409 Other reduced mobility: Secondary | ICD-10-CM

## 2016-02-06 DIAGNOSIS — R29898 Other symptoms and signs involving the musculoskeletal system: Secondary | ICD-10-CM

## 2016-02-06 NOTE — Therapy (Signed)
[place], [place], [address] Phone: [phone]   Fax:  [phone]  Physical Therapy Treatment  Patient Details  Name: Manuel Torres MRN: [id number] Date of Birth: [birth year] Referring Provider: Lorayne Marek   Encounter Date: 02/06/2016      PT End of Session - 02/06/16 1701    Visit Number 5   Number of Visits 12   Date for PT Re-Evaluation 02/19/16   Authorization Type united [employer]    Authorization - Visit Number 5   Authorization - Number of Visits 10   PT Start Time [id number]   PT Stop Time [id number]   PT Time Calculation (min) 52 min   Activity Tolerance Patient tolerated treatment well;No increased pain   Behavior During Therapy [facility] for tasks assessed/performed      Past Medical History  Diagnosis Date  . Hypertension   . Hypercholesteremia   . Gout     NO RECENT FLARE UPS  . Arthritis     Hip  . Gout     Past Surgical History  Procedure Laterality Date  . Right inguinal hernia    . Colonoscopy  08/03/2012    Procedure: COLONOSCOPY;  Surgeon: Rogene Houston, MD;  [facility];  Service: Endoscopy;  Laterality: N/A;  830  . Total hip arthroplasty  10/23/2012    Procedure: TOTAL HIP ARTHROPLASTY;  Surgeon: Gearlean Alf, MD;  Location: [facility];  Service: Orthopedics;  Laterality: Right;  . Joint replacement      r hip Dr. Maureen Ralphs Dec 2013    There were no vitals filed for this visit.  Visit Diagnosis:  Low back pain potentially associated with radiculopathy  Stiffness due to immobility  Abnormality of gait  Activity intolerance  Weakness of both legs      Subjective Assessment - 02/06/16 1653    Subjective Pt reports LBP 4/10 and radicular symptoms ending lateral calf Rt burning sensations.  Pt stated he slept with pillow between knees last night, reports better rest.   Pertinent History Rt THR 2013, HTN,    Diagnostic tests MRI:  spinal stenosis    Currently in Pain?  Yes   Pain Score 4    Pain Location Back   Pain Orientation Lower   Pain Radiating Towards Rt LE ending lateral calf   Pain Onset More than a month ago   Pain Frequency Constant   Aggravating Factors  standing   Pain Relieving Factors lying down   Effect of Pain on Daily Activities increases           [facility] Adult PT Treatment/Exercise - 02/06/16 0001    Lumbar Exercises: Stretches   Active Hamstring Stretch 3 reps;30 seconds   Active Hamstring Stretch Limitations supine   Single Knee to Chest Stretch 3 reps;20 seconds   Single Knee to Chest Stretch Limitations assistance with towel   Lumbar Exercises: Supine   Ab Set 10 reps;5 seconds   Bent Knee Raise 10 reps   Bridge 10 reps   Other Supine Lumbar Exercises decompression with RTB   Lumbar Exercises: Sidelying   Hip Abduction 10 reps   Manual Therapy   Manual Therapy Myofascial release   Manual therapy comments Lt sidelying with pillow between knees   Myofascial Release Rt glut med, TFL, ITB MFR           PT Short Term Goals - 01/23/16 1428    PT SHORT TERM GOAL #1  Title Pt will improve trunk/hip  ROM to be able to pick an item off the ground    Time 4   Period Weeks   Status On-going   PT SHORT TERM GOAL #2   Title Pt will improve strength by one grade to allow pt to feel confident walking with a cane    Time 4   Period Weeks   Status On-going   PT SHORT TERM GOAL #3   Title Pt will report a 50% decrease in pain to allow improved sleeping    Time 4   Period Weeks   Status On-going   PT SHORT TERM GOAL #4   Title Pt will be independent in HEP to allow the above to ocur in a timely manner.    Time 4   Period Weeks   Status On-going           PT Long Term Goals - 01/23/16 1429    PT LONG TERM GOAL #1   Title Pt strength will be improved to 5/5 to allow him to walk without an assistive device   Time 8   Period Weeks   Status On-going   PT LONG TERM GOAL #2   Title PT to be able to demonstrate good  body mechanics while lifting for protecion of his low back    Time 8   Status On-going   PT LONG TERM GOAL #3   Title Pt pain level to decrease to no greater than a 3/10 80% of the day to be able to return to work    Time 8   Status On-going   PT LONG TERM GOAL #4   Title PT to be able to come sit to stand without the use of his Upper extremities   Time 8   Period Weeks   Status On-going   PT LONG TERM GOAL #5   Title Pt to be able to walk 1000 ft without assistive device in six minutes to be able to ambulate in the community with ease    Time 8   Period Weeks   Status On-going   Additional Long Term Goals   Additional Long Term Goals Yes   PT LONG TERM GOAL #6   Title Pt to be able to go up and down two flights of steps with no increased pain to be able to complete work activities    Time 8   Period Weeks   Status New               Plan - 02/06/16 1747    Clinical Impression Statement Session focus on reducing lower back pain and reducing radicular symptoms down Rt LE.  Continued with core and proximal musculature strenghteing with min cueing for proper form and technqiue with exercises.  Began decompression exercises with theraband, pt able to demosntrate appropraiate form and technique and was given theraband and handout to add to HEP.  Ended session with manual MFR to Rt gluteal, TFL and ITB with reports of decreased radicular symptoms following.  Pt reports decreased pain to 2-3/10 and decreased radicular symptoms at end of session.     Pt will benefit from skilled therapeutic intervention in order to improve on the following deficits Abnormal gait;Decreased activity tolerance;Difficulty walking;Decreased balance;Pain;Decreased strength;Decreased range of motion   Rehab Potential Good   PT Frequency 3x / week   PT Duration 4 weeks   PT Treatment/Interventions Electrical Stimulation;Cryotherapy;Gait training;Stair training;Functional mobility training;Therapeutic  activities;Therapeutic exercise;Balance training;Patient/family education;Manual techniques;Moist  Heat;Traction;Ultrasound   PT Next Visit Plan Continue to progress core stability and strength. Review exericises to ensure correct form specially decompression exercises added this session.     PT Home Exercise Plan Added decompression with theraband.          Problem List Patient Active Problem List   Diagnosis Date Noted  . Morbid obesity ([place]) 01/24/2015  . Metabolic syndrome [id number]  . Essential hypertension, benign 04/19/2013  . Hyperlipemia 04/19/2013  . Prediabetes 04/19/2013  . Gout 04/19/2013  . Elevated transaminase level 04/19/2013  . Difficulty in walking(719.7) 11/14/2012  . Weakness of right leg 11/14/2012  . Postop Hyponatremia 10/24/2012  . OA (osteoarthritis) of hip 10/23/2012  . NONSPEC ELEVATION OF LEVELS OF TRANSAMINASE/LDH 05/20/2010   Manuel Torres, LPTA; CBIS [phone]  Manuel Torres 02/06/2016, 6:02 PM  [place], [place], [address] Phone: [phone]   Fax:  [phone]  Name: Manuel Torres MRN: [id number] Date of Birth: [birth year]

## 2016-02-09 ENCOUNTER — Ambulatory Visit (HOSPITAL_COMMUNITY): Payer: 59 | Admitting: Physical Therapy

## 2016-02-09 ENCOUNTER — Other Ambulatory Visit: Payer: Self-pay | Admitting: Family Medicine

## 2016-02-09 DIAGNOSIS — M2560 Stiffness of unspecified joint, not elsewhere classified: Secondary | ICD-10-CM

## 2016-02-09 DIAGNOSIS — M545 Low back pain, unspecified: Secondary | ICD-10-CM

## 2016-02-09 DIAGNOSIS — R6889 Other general symptoms and signs: Secondary | ICD-10-CM

## 2016-02-09 DIAGNOSIS — R29898 Other symptoms and signs involving the musculoskeletal system: Secondary | ICD-10-CM

## 2016-02-09 DIAGNOSIS — R269 Unspecified abnormalities of gait and mobility: Secondary | ICD-10-CM

## 2016-02-09 DIAGNOSIS — Z7409 Other reduced mobility: Secondary | ICD-10-CM

## 2016-02-09 NOTE — Therapy (Signed)
[place], [place], [address] Phone: [phone]   Fax:  [phone]  Physical Therapy Treatment  Patient Details  Name: Manuel Torres MRN: [id number] Date of Birth: 04-27-1961 Referring Provider: Lorayne Marek   Encounter Date: 02/09/2016      PT End of Session - 02/09/16 1613    Visit Number 6   Number of Visits 12   Date for PT Re-Evaluation 02/19/16   Authorization Type united healthcare    Authorization - Visit Number 6   Authorization - Number of Visits 10   PT Start Time [id number]  arrived late    PT Stop Time 1605   PT Time Calculation (min) 39 min   Activity Tolerance Patient tolerated treatment well   Behavior During Therapy [facility] for tasks assessed/performed      Past Medical History  Diagnosis Date  . Hypertension   . Hypercholesteremia   . Gout     NO RECENT FLARE UPS  . Arthritis     Hip  . Gout     Past Surgical History  Procedure Laterality Date  . Right inguinal hernia    . Colonoscopy  08/03/2012    Procedure: COLONOSCOPY;  Surgeon: Rogene Houston, MD;  [facility];  Service: Endoscopy;  Laterality: N/A;  830  . Total hip arthroplasty  10/23/2012    Procedure: TOTAL HIP ARTHROPLASTY;  Surgeon: Gearlean Alf, MD;  [facility];  Service: Orthopedics;  Laterality: Right;  . Joint replacement      r hip Dr. Maureen Ralphs Dec 2013    There were no vitals filed for this visit.  Visit Diagnosis:  Low back pain potentially associated with radiculopathy  Stiffness due to immobility  Abnormality of gait  Activity intolerance  Weakness of both legs      Subjective Assessment - 02/09/16 1530    Subjective Patient arrives today with walker, reporting that he feels like he may have done a bit much over the weekend; using the walker for balance primarily right now    Currently in Pain? Yes   Pain Score 5    Pain Location Back   Pain Orientation Mid   Pain Descriptors /  Indicators Dull;Constant;Burning   Pain Type Chronic pain   Pain Radiating Towards down R leg, ends in calf                          [facility] Adult PT Treatment/Exercise - 02/09/16 0001    Lumbar Exercises: Stretches   Active Hamstring Stretch 30 seconds;2 reps   Active Hamstring Stretch Limitations supine with rope    Passive Hamstring Stretch 3 reps;30 seconds   Passive Hamstring Stretch Limitations slantboard    Single Knee to Chest Stretch 5 reps;10 seconds   Single Knee to Chest Stretch Limitations assist with towel    Lumbar Exercises: Supine   Ab Set 10 reps;5 seconds   Bent Knee Raise 10 reps   Bridge 10 reps   Lumbar Exercises: Sidelying   Hip Abduction 15 reps   Manual Therapy   Manual Therapy Myofascial release   Manual therapy comments Lt sidelying with pillow between knees; performed separatley from all other skilled services    Myofascial Release Rt glut med, TFL, ITB MFR                PT Education - 02/09/16 1613    Education provided No  PT Short Term Goals - 01/23/16 1428    PT SHORT TERM GOAL #1   Title Pt will improve trunk/hip  ROM to be able to pick an item off the ground    Time 4   Period Weeks   Status On-going   PT SHORT TERM GOAL #2   Title Pt will improve strength by one grade to allow pt to feel confident walking with a cane    Time 4   Period Weeks   Status On-going   PT SHORT TERM GOAL #3   Title Pt will report a 50% decrease in pain to allow improved sleeping    Time 4   Period Weeks   Status On-going   PT SHORT TERM GOAL #4   Title Pt will be independent in HEP to allow the above to ocur in a timely manner.    Time 4   Period Weeks   Status On-going           PT Long Term Goals - 01/23/16 1429    PT LONG TERM GOAL #1   Title Pt strength will be improved to 5/5 to allow him to walk without an assistive device   Time 8   Period Weeks   Status On-going   PT LONG TERM GOAL #2   Title PT to be  able to demonstrate good body mechanics while lifting for protecion of his low back    Time 8   Status On-going   PT LONG TERM GOAL #3   Title Pt pain level to decrease to no greater than a 3/10 80% of the day to be able to return to work    Time 8   Status On-going   PT LONG TERM GOAL #4   Title PT to be able to come sit to stand without the use of his Upper extremities   Time 8   Period Weeks   Status On-going   PT LONG TERM GOAL #5   Title Pt to be able to walk 1000 ft without assistive device in six minutes to be able to ambulate in the community with ease    Time 8   Period Weeks   Status On-going   Additional Long Term Goals   Additional Long Term Goals Yes   PT LONG TERM GOAL #6   Title Pt to be able to go up and down two flights of steps with no increased pain to be able to complete work activities    Time 8   Period Weeks   Status New               Plan - 02/09/16 1614    Clinical Impression Statement Patient arrived a few minutes late today; focusd on functional stretching as well as proximal and core muscle activation and exercises today, with emphasis on activation of core before exercise with limbs today. FInished session with soft tissue mobilization to R TFL/ITB/gluts with red theraball today, which patient reports has been the most successful in reducing his current pain. Patient reported improvement of pain down to 2/10 at end of session.    Pt will benefit from skilled therapeutic intervention in order to improve on the following deficits Abnormal gait;Decreased activity tolerance;Difficulty walking;Decreased balance;Pain;Decreased strength;Decreased range of motion   Rehab Potential Good   PT Frequency 3x / week   PT Duration 4 weeks   PT Treatment/Interventions Electrical Stimulation;Cryotherapy;Gait training;Stair training;Functional mobility training;Therapeutic activities;Therapeutic exercise;Balance training;Patient/family education;Manual  techniques;Moist Heat;Traction;Ultrasound  PT Next Visit Plan Continue to progress core stability and strength. Review exericises to ensure correct form specially decompression exercises added this session.  Continue manual.    PT Home Exercise Plan Added decompression with theraband.     Consulted and Agree with Plan of Care Patient        Problem List Patient Active Problem List   Diagnosis Date Noted  . Morbid obesity ([place]) 01/24/2015  . Metabolic syndrome [id number]  . Essential hypertension, benign 04/19/2013  . Hyperlipemia 04/19/2013  . Prediabetes 04/19/2013  . Gout 04/19/2013  . Elevated transaminase level 04/19/2013  . Difficulty in walking(719.7) 11/14/2012  . Weakness of right leg 11/14/2012  . Postop Hyponatremia 10/24/2012  . OA (osteoarthritis) of hip 10/23/2012  . NONSPEC ELEVATION OF LEVELS OF TRANSAMINASE/LDH 05/20/2010    Deniece Ree PT, DPT [place] [address] [place], [place], [address] Phone: [phone]   Fax:  [phone]  Name: Manuel Torres MRN: [id number] Date of Birth: 08-Feb-1961

## 2016-02-11 ENCOUNTER — Ambulatory Visit (HOSPITAL_COMMUNITY): Payer: 59 | Admitting: Physical Therapy

## 2016-02-11 DIAGNOSIS — Z7409 Other reduced mobility: Secondary | ICD-10-CM

## 2016-02-11 DIAGNOSIS — M545 Low back pain, unspecified: Secondary | ICD-10-CM

## 2016-02-11 DIAGNOSIS — M2560 Stiffness of unspecified joint, not elsewhere classified: Secondary | ICD-10-CM

## 2016-02-11 DIAGNOSIS — R6889 Other general symptoms and signs: Secondary | ICD-10-CM

## 2016-02-11 DIAGNOSIS — R29898 Other symptoms and signs involving the musculoskeletal system: Secondary | ICD-10-CM

## 2016-02-11 DIAGNOSIS — R269 Unspecified abnormalities of gait and mobility: Secondary | ICD-10-CM

## 2016-02-11 NOTE — Patient Instructions (Addendum)
Opposite Arm / Leg Lift (Prone)    Abdomen and head supported, left knee locked, raise leg and opposite arm _2___ inches from floor. Repeat __10__ times per set. Do ___1_ sets per session. Do ___1_ sessions per day.  http://orth.exer.us/1114   Copyright  VHI. All rights reserved.  On Elbows (Prone)   Using your back muscles  Rise up on elbows as high as possible, keeping hips on floor. Hold __2__ seconds. Repeat __10__ times per set. Do _1___ sets per session. Do __2__ sessions per day.  http://orth.exer.us/92   Copyright  VHI. All rights reserved.  Heel Raise: Bilateral (Standing)    Rise on balls of feet. Repeat __10__ times per set. Do __1__ sets per session. Do _2___ sessions per day.  http://orth.exer.us/38   Copyright  VHI. All rights reserved.  Functional Quadriceps: Chair Squat    Keeping feet flat on floor, shoulder width apart, squat as low as is comfortable. Use support as necessary. Repeat __10__ times per set. Do ___1_ sets per session. Do ___2_ sessions per day.  http://orth.exer.us/736   Copyright  VHI. All rights reserved.  Iliotibial Band Stretch, Side-Lying    Lie on side facing edge of bed, top arm behind. Allow top leg to drape over edge. Hold __30_ seconds.  Repeat _3__ times per session. Do _2__ sessions per day. May be on your side using a dog leash to pull your leg over if this is more comfortable. Copyright  VHI. All rights reserved.  Straight Leg Raise    Tighten stomach and slowly raise locked right leg __15__ inches from floor. Repeat 10____ times per set. Do ___1_ sets per session. Do ___2_ sessions per day.  http://orth.exer.us/1102   Copyright  VHI. All rights reserved.

## 2016-02-11 NOTE — Therapy (Addendum)
[place], [place], [address] Phone: [phone]   Fax:  [phone]  Physical Therapy Treatment  Patient Details  Name: Manuel Torres MRN: [id number] Date of Birth: 05-Feb-1961 Referring Provider: Lorayne Marek   Encounter Date: 02/11/2016      PT End of Session - 02/11/16 1221    Visit Number 7   Number of Visits 12   Date for PT Re-Evaluation 02/19/16   Authorization Type united healthcare    Authorization - Visit Number 7   Authorization - Number of Visits 10   PT Start Time 0856   PT Stop Time 0937   PT Time Calculation (min) 41 min   Activity Tolerance Patient tolerated treatment well   Behavior During Therapy [facility] for tasks assessed/performed      Past Medical History  Diagnosis Date  . Hypertension   . Hypercholesteremia   . Gout     NO RECENT FLARE UPS  . Arthritis     Hip  . Gout     Past Surgical History  Procedure Laterality Date  . Right inguinal hernia    . Colonoscopy  08/03/2012    Procedure: COLONOSCOPY;  Surgeon: Rogene Houston, MD;  [facility];  Service: Endoscopy;  Laterality: N/A;  830  . Total hip arthroplasty  10/23/2012    Procedure: TOTAL HIP ARTHROPLASTY;  Surgeon: Gearlean Alf, MD;  Location[facility];  Service: Orthopedics;  Laterality: Right;  . Joint replacement      r hip Dr. Maureen Ralphs Dec 2013    There were no vitals filed for this visit.  Visit Diagnosis:  Low back pain potentially associated with radiculopathy  Stiffness due to immobility  Abnormality of gait  Activity intolerance  Weakness of both legs      Subjective Assessment - 02/11/16 1218    Subjective  Pt states that he tried to use a cane over the weekend but ended up having increased pain so he went back to the walker    Currently in Pain? Yes   Pain Score 3    Pain Location Back   Pain Orientation Right   Pain Descriptors / Indicators Constant;Dull   Pain Radiating Towards  radiates into leg approximatley 75% of the time    Pain Onset More than a month ago   Pain Frequency Constant   Aggravating Factors  standing   Pain Relieving Factors lying down    Effect of Pain on Daily Activities increases   Multiple Pain Sites Yes   Pain Score 2   Pain Location Hip   Pain Orientation Left   Pain Descriptors / Indicators Aching   Pain Type Chronic pain   Pain Onset More than a month ago   Pain Frequency Constant   Aggravating Factors  walking   Pain Relieving Factors sitting   Effect of Pain on Daily Activities increases                         [facility] Adult PT Treatment/Exercise - 02/11/16 0001    Ambulation/Gait   Ambulation/Gait Yes   Ambulation/Gait Assistance 6: Modified independent (Device/Increase time)   Ambulation Distance (Feet) 200 Feet   Assistive device Straight cane   Gait Pattern Within Functional Limits;Step-through pattern   Lumbar Exercises: Stretches   Passive Hamstring Stretch 1 rep;60 seconds   Standing Extension 5 reps   Press Ups 5 reps  x2   ITB  Stretch 3 reps;30 seconds   Lumbar Exercises: Supine   Straight Leg Raise 10 reps   Lumbar Exercises: Prone   Opposite Arm/Leg Raise Right arm/Left leg;Left arm/Right leg;10 reps                PT Education - 02/11/16 0917    Education provided Yes   Education Details updated HEP   Person(s) Educated Patient   Methods Explanation;Demonstration;Handout;Verbal cues   Comprehension Verbalized understanding;Returned demonstration          PT Short Term Goals - 01/23/16 1428    PT SHORT TERM GOAL #1   Title Pt will improve trunk/hip  ROM to be able to pick an item off the ground    Time 4   Period Weeks   Status On-going   PT SHORT TERM GOAL #2   Title Pt will improve strength by one grade to allow pt to feel confident walking with a cane    Time 4   Period Weeks   Status On-going   PT SHORT TERM GOAL #3   Title Pt will report a 50% decrease in pain to  allow improved sleeping    Time 4   Period Weeks   Status On-going   PT SHORT TERM GOAL #4   Title Pt will be independent in HEP to allow the above to ocur in a timely manner.    Time 4   Period Weeks   Status On-going           PT Long Term Goals - 01/23/16 1429    PT LONG TERM GOAL #1   Title Pt strength will be improved to 5/5 to allow him to walk without an assistive device   Time 8   Period Weeks   Status On-going   PT LONG TERM GOAL #2   Title PT to be able to demonstrate good body mechanics while lifting for protecion of his low back    Time 8   Status On-going   PT LONG TERM GOAL #3   Title Pt pain level to decrease to no greater than a 3/10 80% of the day to be able to return to work    Time 8   Status On-going   PT LONG TERM GOAL #4   Title PT to be able to come sit to stand without the use of his Upper extremities   Time 8   Period Weeks   Status On-going   PT LONG TERM GOAL #5   Title Pt to be able to walk 1000 ft without assistive device in six minutes to be able to ambulate in the community with ease    Time 8   Period Weeks   Status On-going   Additional Long Term Goals   Additional Long Term Goals Yes   PT LONG TERM GOAL #6   Title Pt to be able to go up and down two flights of steps with no increased pain to be able to complete work activities    Time 8   Period Weeks   Status New               Plan - 02/11/16 [id number]    Clinical Impression Statement Added higher level stabilization as well as prone on elbow stretch with good form after verbal and tactile cuing.  Gait trained with cane and urged pt to be ambulating with cane 50% of the time and walker 50% of the time then slowly increse cane/ walker  ratio.    Pt will benefit from skilled therapeutic intervention in order to improve on the following deficits Abnormal gait;Decreased activity tolerance;Difficulty walking;Decreased balance;Pain;Decreased strength;Decreased range of motion   PT Next  Visit Plan Progress to funcional stabiilization including lunges and step ups         Problem List Patient Active Problem List   Diagnosis Date Noted  . Morbid obesity ([place]) 01/24/2015  . Metabolic syndrome [id number]  . Essential hypertension, benign 04/19/2013  . Hyperlipemia 04/19/2013  . Prediabetes 04/19/2013  . Gout 04/19/2013  . Elevated transaminase level 04/19/2013  . Difficulty in walking(719.7) 11/14/2012  . Weakness of right leg 11/14/2012  . Postop Hyponatremia 10/24/2012  . OA (osteoarthritis) of hip 10/23/2012  . NONSPEC ELEVATION OF LEVELS OF TRANSAMINASE/LDH 05/20/2010  Rayetta Humphrey, PT CLT [phone] 02/11/2016, 12:21 PM  [place] [address] [place], [place], [address] Phone: [phone]   Fax:  [phone]  Name: Manuel Torres MRN: [id number] Date of Birth: [birth year]

## 2016-02-12 ENCOUNTER — Telehealth (HOSPITAL_COMMUNITY): Payer: Self-pay | Admitting: Physical Therapy

## 2016-02-12 ENCOUNTER — Ambulatory Visit (HOSPITAL_COMMUNITY): Payer: 59 | Admitting: Physical Therapy

## 2016-02-12 NOTE — Telephone Encounter (Signed)
Called pt re: missed appointment.  Pt reminded that his next appointment is Monday at 1345.   Rayetta Humphrey, [place] CLT [phone]

## 2016-02-16 ENCOUNTER — Ambulatory Visit (HOSPITAL_COMMUNITY): Payer: 59 | Attending: Family Medicine

## 2016-02-16 DIAGNOSIS — M623 Immobility syndrome (paraplegic): Secondary | ICD-10-CM | POA: Insufficient documentation

## 2016-02-16 DIAGNOSIS — R6889 Other general symptoms and signs: Secondary | ICD-10-CM | POA: Diagnosis present

## 2016-02-16 DIAGNOSIS — Z7409 Other reduced mobility: Secondary | ICD-10-CM

## 2016-02-16 DIAGNOSIS — M2560 Stiffness of unspecified joint, not elsewhere classified: Secondary | ICD-10-CM

## 2016-02-16 DIAGNOSIS — R29898 Other symptoms and signs involving the musculoskeletal system: Secondary | ICD-10-CM | POA: Insufficient documentation

## 2016-02-16 DIAGNOSIS — R2689 Other abnormalities of gait and mobility: Secondary | ICD-10-CM | POA: Diagnosis present

## 2016-02-16 DIAGNOSIS — M6281 Muscle weakness (generalized): Secondary | ICD-10-CM | POA: Insufficient documentation

## 2016-02-16 DIAGNOSIS — M545 Low back pain, unspecified: Secondary | ICD-10-CM

## 2016-02-16 DIAGNOSIS — R269 Unspecified abnormalities of gait and mobility: Secondary | ICD-10-CM | POA: Insufficient documentation

## 2016-02-16 NOTE — Therapy (Signed)
[place], [place], [address] Phone: [phone]   Fax:  [phone]  Physical Therapy Treatment  Patient Details  Name: Manuel Torres MRN: [id number] Date of Birth: Jan 08, 1961 Referring Provider: Lorayne Marek   Encounter Date: 02/16/2016      PT End of Session - 02/16/16 1354    Visit Number 8   Number of Visits 12   Date for PT Re-Evaluation 02/19/16   Authorization Type united healthcare    Authorization - Visit Number 8   Authorization - Number of Visits 10   PT Start Time [id number]   PT Stop Time 1440   PT Time Calculation (min) 47 min   Activity Tolerance Patient tolerated treatment well   Behavior During Therapy [facility] for tasks assessed/performed      Past Medical History  Diagnosis Date  . Hypertension   . Hypercholesteremia   . Gout     NO RECENT FLARE UPS  . Arthritis     Hip  . Gout     Past Surgical History  Procedure Laterality Date  . Right inguinal hernia    . Colonoscopy  08/03/2012    Procedure: COLONOSCOPY;  Surgeon: Rogene Houston, MD;  [facility];  Service: Endoscopy;  Laterality: N/A;  830  . Total hip arthroplasty  10/23/2012    Procedure: TOTAL HIP ARTHROPLASTY;  Surgeon: Gearlean Alf, MD;  Location: [facility];  Service: Orthopedics;  Laterality: Right;  . Joint replacement      r hip Dr. Maureen Ralphs Dec 2013    There were no vitals filed for this visit.  Visit Diagnosis:  Low back pain potentially associated with radiculopathy  Stiffness due to immobility  Abnormality of gait  Activity intolerance  Weakness of both legs      Subjective Assessment - 02/16/16 1354    Subjective Pt reported that his low back and L hip "are hurting today, but not as bad". Pt is scheduled to receive a second round of injections to the lumbar spine to manage his pain tomorrow. He reported good compliance with his current HEP and was able to verbalize each ther ex.   Pertinent  History Rt THR 2013, HTN,    Limitations Standing;Walking   Diagnostic tests MRI:  spinal stenosis    Patient Stated Goals Pt's goals include reducing his LBP/L hip pain and ambulate without an AD.    Currently in Pain? Yes   Pain Score 4   LBP ranges betwen a 3-7/10 on a VAS   Pain Location Back   Pain Orientation Lower   Pain Descriptors / Indicators Aching;Constant;Dull   Pain Type Chronic pain   Pain Radiating Towards into R LE that extends into the calf region (burning)   Pain Onset More than a month ago   Pain Frequency Constant   Aggravating Factors  standing     Pain Relieving Factors lying down and sitting    Effect of Pain on Daily Activities limited with long distance ambulation    Multiple Pain Sites Yes   Pain Score 4  L hip pain has ranged between a 4-8/10 on a VAS since last PT visit   Pain Location Hip   Pain Orientation Left   Pain Descriptors / Indicators Aching   Pain Type Chronic pain   Pain Onset More than a month ago   Pain Frequency Constant   Aggravating Factors  walking    Pain Relieving Factors sitting  Effect of Pain on Daily Activities limited with long distance ambulation                          [facility] Adult PT Treatment/Exercise - 02/16/16 0001    Lumbar Exercises: Stretches   Quad Stretch 30 seconds;3 reps;Other (comment)  in [place] position, Producer, television/film/video Limitations with strap    ITB Stretch 3 reps;30 seconds;Other (comment)  L LE only   ITB Stretch Limitations with strap in supine   Lumbar Exercises: Seated   Hip Flexion on Ball Strengthening;15 reps  sitting on edge of plinth; alt marching ; 1 set    Lumbar Exercises: Supine   Ab Set 10 reps;5 seconds   Clam 1 second;10 reps   Clam Limitations 2 sets with red thera-band    Bent Knee Raise 10 reps   Bridge 10 reps   Bridge Limitations 2 sets   Straight Leg Raise 10 reps   Straight Leg Raises Limitations 2 sets   Manual Therapy   Manual Therapy  Myofascial release;Soft tissue mobilization;Passive ROM   Manual therapy comments Completed in hooklying position ; performed separatley from all other skilled services    Myofascial Release Bil glut med, TFL, ITB MFR   Passive ROM L hip PROM in flex/IR/ER x 2 minutes each; R hip PROM in flex and gentle ER x 2 minutes each;  hx of R THA                 PT Education - 02/16/16 1417    Education provided Yes   Education Details current HEP, log rolling technique with sit<>supine transfers, and core bracing with functional mobility activities    Person(s) Educated Patient   Methods Explanation;Demonstration;Verbal cues   Comprehension Verbalized understanding;Returned demonstration;Need further instruction          PT Short Term Goals - 01/23/16 1428    PT SHORT TERM GOAL #1   Title Pt will improve trunk/hip  ROM to be able to pick an item off the ground    Time 4   Period Weeks   Status On-going   PT SHORT TERM GOAL #2   Title Pt will improve strength by one grade to allow pt to feel confident walking with a cane    Time 4   Period Weeks   Status On-going   PT SHORT TERM GOAL #3   Title Pt will report a 50% decrease in pain to allow improved sleeping    Time 4   Period Weeks   Status On-going   PT SHORT TERM GOAL #4   Title Pt will be independent in HEP to allow the above to ocur in a timely manner.    Time 4   Period Weeks   Status On-going           PT Long Term Goals - 01/23/16 1429    PT LONG TERM GOAL #1   Title Pt strength will be improved to 5/5 to allow him to walk without an assistive device   Time 8   Period Weeks   Status On-going   PT LONG TERM GOAL #2   Title PT to be able to demonstrate good body mechanics while lifting for protecion of his low back    Time 8   Status On-going   PT LONG TERM GOAL #3   Title Pt pain level to decrease to no greater than a 3/10 80% of  the day to be able to return to work    Time 8   Status On-going   PT  [place] #4   Title PT to be able to come sit to stand without the use of his Upper extremities   Time 8   Period Weeks   Status On-going   PT LONG TERM GOAL #5   Title Pt to be able to walk 1000 ft without assistive device in six minutes to be able to ambulate in the community with ease    Time 8   Period Weeks   Status On-going   Additional Long Term Goals   Additional Long Term Goals Yes   PT LONG TERM GOAL #6   Title Pt to be able to go up and down two flights of steps with no increased pain to be able to complete work activities    Time 8   Period Weeks   Status New               Plan - 02/16/16 1418    Clinical Impression Statement Initiated PT tx with B hip PROM (R hip completed within THA precautions) and LE stretches. Added hip flexor and quad stretch in [place] position with use of strap in order to improve pelvic alignment thus reducing stress on the lumbar spine. Added core stabilization in seated position with marching in order to improve core bracing with functional mobility activities.  Verbal cues required to maintain upright posture and to avoid leaning with seated alternating marching ther ex. Improved technique was demo with subsequent reps. Added additional sets with SLR, clam shells, and bridging ther ex with good tolerance reported and assessed. Verbal cues were required to avoid holding breath and to maintain core braced and lumbar spine neutral position with all ther ex. Instructed pt on log rolling technique in order to reduce stress on the lumbar spine with sit<>supine transfers. Fair technique demo. Pt requires additional education and instruction with log rolling technique. Pt continues to present with B hip/core weakness and limited flexibility of B LE. Pt has an appointment tomorrow to receive his second round of injections to the lumbar spine. Therapist asked pt to call clinic to notify if MD wants him to hold PT for a few days. Pt verbalized full  understanding. Continue with current POC and progress with core stabilization ther ex as tolerated.   Pt will benefit from skilled therapeutic intervention in order to improve on the following deficits Abnormal gait;Decreased activity tolerance;Difficulty walking;Decreased balance;Pain;Decreased strength;Decreased range of motion   Rehab Potential Good   PT Frequency 3x / week   PT Duration 4 weeks   PT Treatment/Interventions Electrical Stimulation;Cryotherapy;Gait training;Stair training;Functional mobility training;Therapeutic activities;Therapeutic exercise;Balance training;Patient/family education;Manual techniques;Moist Heat;Traction;Ultrasound   PT Next Visit Plan Complete Re-assessment at next PT visit. Progress to funcional stabiilization in seated and standing position, manual therapy to reduce TPs t/o B quads/glute med/ITB, and continue with B hip strengthening    PT Home Exercise Plan Reviewed HEP with no additional ther ex added this visit    Consulted and Agree with Plan of Care Patient        Problem List Patient Active Problem List   Diagnosis Date Noted  . Morbid obesity ([place]) 01/24/2015  . Metabolic syndrome [id number]  . Essential hypertension, benign 04/19/2013  . Hyperlipemia 04/19/2013  . Prediabetes 04/19/2013  . Gout 04/19/2013  . Elevated transaminase level 04/19/2013  . Difficulty in walking(719.7) 11/14/2012  . Weakness of right leg  11/14/2012  . Postop Hyponatremia 10/24/2012  . OA (osteoarthritis) of hip 10/23/2012  . NONSPEC ELEVATION OF LEVELS OF TRANSAMINASE/LDH 05/20/2010    Garen Lah, PT, DPT 02/16/2016, 2:59 PM  [place], [place], [address] Phone: [phone]   Fax:  [phone]  Name: Manuel Torres MRN: [id number] Date of Birth: Nov 23, 1960

## 2016-02-18 ENCOUNTER — Ambulatory Visit (HOSPITAL_COMMUNITY): Payer: 59 | Admitting: Physical Therapy

## 2016-02-18 DIAGNOSIS — M545 Low back pain, unspecified: Secondary | ICD-10-CM

## 2016-02-18 DIAGNOSIS — Z7409 Other reduced mobility: Secondary | ICD-10-CM

## 2016-02-18 DIAGNOSIS — R6889 Other general symptoms and signs: Secondary | ICD-10-CM

## 2016-02-18 DIAGNOSIS — R269 Unspecified abnormalities of gait and mobility: Secondary | ICD-10-CM

## 2016-02-18 DIAGNOSIS — R29898 Other symptoms and signs involving the musculoskeletal system: Secondary | ICD-10-CM

## 2016-02-18 DIAGNOSIS — M2560 Stiffness of unspecified joint, not elsewhere classified: Secondary | ICD-10-CM

## 2016-02-18 NOTE — Therapy (Signed)
[place], [place], [address] Phone: [phone]   Fax:  [phone]  Physical Therapy Treatment  Patient Details  Name: Manuel Torres MRN: [id number] Date of Birth: [birth year] Referring Provider: Lorayne Marek   Encounter Date: 02/18/2016      PT End of Session - 02/18/16 1431    Visit Number 9   Number of Visits 12   Date for PT Re-Evaluation 02/19/16   Authorization Type united healthcare    Authorization - Visit Number 9   Authorization - Number of Visits 10   PT Start Time 1351   PT Stop Time 1435   PT Time Calculation (min) 44 min   Activity Tolerance Patient tolerated treatment well      Past Medical History  Diagnosis Date  . Hypertension   . Hypercholesteremia   . Gout     NO RECENT FLARE UPS  . Arthritis     Hip  . Gout     Past Surgical History  Procedure Laterality Date  . Right inguinal hernia    . Colonoscopy  08/03/2012    Procedure: COLONOSCOPY;  Surgeon: Rogene Houston, MD;  [facility];  Service: Endoscopy;  Laterality: N/A;  830  . Total hip arthroplasty  10/23/2012    Procedure: TOTAL HIP ARTHROPLASTY;  Surgeon: Gearlean Alf, MD;  Location: [facility];  Service: Orthopedics;  Laterality: Right;  . Joint replacement      r hip Dr. Maureen Ralphs Dec 2013    There were no vitals filed for this visit.  Visit Diagnosis:  Low back pain potentially associated with radiculopathy  Stiffness due to immobility  Abnormality of gait  Activity intolerance  Weakness of both legs      Subjective Assessment - 02/18/16 1355    Subjective Pt states that he had an injection yesterday.  He checked with the MD and it is alright to do therapy today    Currently in Pain? Yes   Pain Score 2    Pain Location Back   Pain Orientation Lower   Pain Descriptors / Indicators Aching   Pain Type Chronic pain   Pain Onset More than a month ago   Pain Frequency Constant   Aggravating  Factors  standing    Pain Relieving Factors lying down   Multiple Pain Sites Yes   Pain Score 5   Pain Location Hip   Pain Orientation Right;Left   Pain Descriptors / Indicators Burning   Pain Type Chronic pain   Pain Onset More than a month ago   Pain Frequency Constant   Aggravating Factors  standing/ walking    Pain Relieving Factors icing                          [facility] Adult PT Treatment/Exercise - 02/18/16 0001    Lumbar Exercises: Stretches   Passive Hamstring Stretch 1 rep;60 seconds   Passive Hamstring Stretch Limitations slantboard    Lower Trunk Rotation 5 reps   Lower Trunk Rotation Limitations x2   Pelvic Tilt 5 reps   Pelvic Tilt Limitations x2   ITB Stretch 3 reps;30 seconds;Other (comment)  L LE only   ITB Stretch Limitations standing    Lumbar Exercises: Standing   Heel Raises 15 reps   Functional Squats 10 reps   Scapular Retraction Self ROM;Both;10 reps   Row Strengthening;10 reps;Theraband   Shoulder Extension Strengthening;Both;10 reps  Other Standing Lumbar Exercises hip abduction/extension on both x 10 reps    Other Standing Lumbar Exercises 3-D hip excursion                   PT Short Term Goals - 01/23/16 1428    PT SHORT TERM GOAL #1   Title Pt will improve trunk/hip  ROM to be able to pick an item off the ground    Time 4   Period Weeks   Status On-going   PT SHORT TERM GOAL #2   Title Pt will improve strength by one grade to allow pt to feel confident walking with a cane    Time 4   Period Weeks   Status On-going   PT SHORT TERM GOAL #3   Title Pt will report a 50% decrease in pain to allow improved sleeping    Time 4   Period Weeks   Status On-going   PT SHORT TERM GOAL #4   Title Pt will be independent in HEP to allow the above to ocur in a timely manner.    Time 4   Period Weeks   Status On-going           PT Long Term Goals - 01/23/16 1429    PT LONG TERM GOAL #1   Title Pt strength will be  improved to 5/5 to allow him to walk without an assistive device   Time 8   Period Weeks   Status On-going   PT LONG TERM GOAL #2   Title PT to be able to demonstrate good body mechanics while lifting for protecion of his low back    Time 8   Status On-going   PT LONG TERM GOAL #3   Title Pt pain level to decrease to no greater than a 3/10 80% of the day to be able to return to work    Time 8   Status On-going   PT LONG TERM GOAL #4   Title PT to be able to come sit to stand without the use of his Upper extremities   Time 8   Period Weeks   Status On-going   PT LONG TERM GOAL #5   Title Pt to be able to walk 1000 ft without assistive device in six minutes to be able to ambulate in the community with ease    Time 8   Period Weeks   Status On-going   Additional Long Term Goals   Additional Long Term Goals Yes   PT LONG TERM GOAL #6   Title Pt to be able to go up and down two flights of steps with no increased pain to be able to complete work activities    Time 8   Period Weeks   Status New               Plan - 02/18/16 1432    Clinical Impression Statement Progressed pt to more closed chain functional strengthening exercises with good tolerance.  All exercises were new and needed therapist faciliatation for proper technique.  Pt to department ambulating with a cane using good form      PT Next Visit Plan begin step ups complete reassessment next visit.         Problem List Patient Active Problem List   Diagnosis Date Noted  . Morbid obesity ([place]) 01/24/2015  . Metabolic syndrome [id number]  . Essential hypertension, benign 04/19/2013  . Hyperlipemia 04/19/2013  . Prediabetes 04/19/2013  .  Gout 04/19/2013  . Elevated transaminase level 04/19/2013  . Difficulty in walking(719.7) 11/14/2012  . Weakness of right leg 11/14/2012  . Postop Hyponatremia 10/24/2012  . OA (osteoarthritis) of hip 10/23/2012  . NONSPEC ELEVATION OF LEVELS OF TRANSAMINASE/LDH 05/20/2010     Rayetta Humphrey, PT CLT [phone] 02/18/2016, 2:38 PM  [place] [address] [place], [place], [address] Phone: [phone]   Fax:  [phone]  Name: Manuel Torres MRN: [id number] Date of Birth: 31-Aug-1961

## 2016-02-20 ENCOUNTER — Ambulatory Visit (HOSPITAL_COMMUNITY): Payer: 59 | Admitting: Physical Therapy

## 2016-02-20 DIAGNOSIS — M545 Low back pain, unspecified: Secondary | ICD-10-CM

## 2016-02-20 DIAGNOSIS — R2689 Other abnormalities of gait and mobility: Secondary | ICD-10-CM

## 2016-02-20 DIAGNOSIS — M6281 Muscle weakness (generalized): Secondary | ICD-10-CM

## 2016-02-20 NOTE — Therapy (Signed)
[place], [place], [address] Phone: [phone]   Fax:  [phone]  Physical Therapy Treatment  Patient Details  Name: AMIERE MOHER MRN: [id number] Date of Birth: 03-Jul-1961 Referring Provider: Lorayne Marek  Encounter Date: 02/20/2016      PT End of Session - 02/20/16 1339    Visit Number 10   Number of Visits 18   Date for PT Re-Evaluation 03/21/16   Authorization Type united healthcare    Authorization - Visit Number 10   Authorization - Number of Visits 18   PT Start Time 1302   PT Stop Time 1350   PT Time Calculation (min) 48 min   Activity Tolerance Patient tolerated treatment well      Past Medical History  Diagnosis Date  . Hypertension   . Hypercholesteremia   . Gout     NO RECENT FLARE UPS  . Arthritis     Hip  . Gout     Past Surgical History  Procedure Laterality Date  . Right inguinal hernia    . Colonoscopy  08/03/2012    Procedure: COLONOSCOPY;  Surgeon: Rogene Houston, MD;  [facility];  Service: Endoscopy;  Laterality: N/A;  830  . Total hip arthroplasty  10/23/2012    Procedure: TOTAL HIP ARTHROPLASTY;  Surgeon: Gearlean Alf, MD;  [facility];  Service: Orthopedics;  Laterality: Right;  . Joint replacement      r hip Dr. Maureen Ralphs Dec 2013    There were no vitals filed for this visit.      Subjective Assessment - 02/20/16 1305    Subjective Pt states that he is using his ane about all the time now.   Pt states that he has no questions with his exercises.    Pertinent History Rt THR 2013, HTN,    How long can you sit comfortably? able to sit for two hours    How long can you stand comfortably? states 5-10 minutes not was stating  20 minutes    How long can you walk comfortably? able to walk with his walker for 20 minutes; with his cane able to walk 15 minutes.  Pt was stating that he could walk for 30 mintues with a walker.    Diagnostic tests MRI:   spinal stenosis    Patient Stated Goals Pt's goals include reducing his LBP/L hip pain and ambulate without an AD.    Currently in Pain? Yes   Pain Score 3    Pain Location Back   Pain Orientation Lower   Pain Descriptors / Indicators Aching   Pain Type Chronic pain   Pain Onset More than a month ago   Pain Frequency Constant   Aggravating Factors  prolong sitting    Pain Relieving Factors lying down    Multiple Pain Sites Yes   Pain Score 3   Pain Location Hip   Pain Orientation Right   Pain Descriptors / Indicators Aching;Burning   Pain Type Chronic pain   Pain Onset More than a month ago   Pain Frequency Constant   Aggravating Factors  standing and walking             [facility] PT Assessment - 02/20/16 0001    Assessment   Medical Diagnosis Spinal stenosis/Lt bursitis    Referring Provider Lorayne Marek   Onset Date/Surgical Date 11/17/15   Next MD Visit 03/02/2016  Nelva Bush not Rolena Infante    Prior  Therapy not for this condition    Precautions   Precautions Fall   Restrictions   Weight Bearing Restrictions No   Home Environment   Living Environment Private residence   Type of [place] to enter   Entrance Stairs-Number of Steps 1   [place] to live on main level with bedroom/bathroom   Prior Function   Vocation Full time employment   Vocation Requirements on feet all the time lifting up to 100 feet with assist    Leisure walking, golfing    Observation/Other Assessments   Focus on Therapeutic Outcomes (FOTO)  39  was 40    AROM   Lumbar Flexion 30 was 40    Lumbar Extension 12 was 10   Strength   Right Hip Flexion 3/5  was 3-/5    Right Hip Extension 4+/5  was 3-/5   Right Hip ABduction 4-/5  was 3-5    Left Hip Flexion 3+/5  was 2+/5    Left Hip Extension 4/5  was 2/5   Left Hip ABduction 3-/5  was 2+/5   Right/Left Knee Right;Left   Right Knee Flexion 5/5   Right Knee Extension 5/5  was 4+/5   Left Knee Flexion 5/5   Left  Knee Extension 5/5  was 4/5   Right Ankle Dorsiflexion 5/5   Left Ankle Dorsiflexion 5/5   Transfers   Five time sit to stand comments  unable to come sit to stand without using UE    6 Minute Walk- Baseline   6 Minute Walk- Baseline --  only able to complete 5 minutes due to hip pan;876 feet.                      [facility] Adult PT Treatment/Exercise - 02/20/16 0001    Lumbar Exercises: Standing   Heel Raises 10 reps   Other Standing Lumbar Exercises 3-D hip excursion    Lumbar Exercises: Seated   Sit to Stand Limitations hip abduction with blue t-band x 15    Lumbar Exercises: Sidelying   Hip Abduction 10 reps   Modalities   Modalities Traction   Traction   Type of Traction Lumbar   Min (lbs) 70   Max (lbs) 70   Hold Time 12   Time 12                PT Education - 02/20/16 1339    Education provided Yes   Education Details need to work on stretching, hip flexion, abduction; reasoning behind traction    Person(s) Educated Patient   Methods Explanation   Comprehension Verbalized understanding          PT Short Term Goals - 01/23/16 1428    PT SHORT TERM GOAL #1   Title Pt will improve trunk/hip  ROM to be able to pick an item off the ground    Time 4   Period Weeks   Status On-going   PT SHORT TERM GOAL #2   Title Pt will improve strength by one grade to allow pt to feel confident walking with a cane    Time 4   Period Weeks   Status On-going   PT SHORT TERM GOAL #3   Title Pt will report a 50% decrease in pain to allow improved sleeping    Time 4   Period Weeks   Status On-going   PT SHORT TERM GOAL #4   Title Pt  will be independent in HEP to allow the above to ocur in a timely manner.    Time 4   Period Weeks   Status On-going           PT Long Term Goals - 01/23/16 1429    PT LONG TERM GOAL #1   Title Pt strength will be improved to 5/5 to allow him to walk without an assistive device   Time 8   Period Weeks   Status  On-going   PT LONG TERM GOAL #2   Title PT to be able to demonstrate good body mechanics while lifting for protecion of his low back    Time 8   Status On-going   PT LONG TERM GOAL #3   Title Pt pain level to decrease to no greater than a 3/10 80% of the day to be able to return to work    Time 8   Status On-going   PT LONG TERM GOAL #4   Title PT to be able to come sit to stand without the use of his Upper extremities   Time 8   Period Weeks   Status On-going   PT LONG TERM GOAL #5   Title Pt to be able to walk 1000 ft without assistive device in six minutes to be able to ambulate in the community with ease    Time 8   Period Weeks   Status On-going   Additional Long Term Goals   Additional Long Term Goals Yes   PT LONG TERM GOAL #6   Title Pt to be able to go up and down two flights of steps with no increased pain to be able to complete work activities    Time 8   Period Weeks   Status New               Plan - 02/20/16 1342    Clinical Impression Statement reassessment completed with noted gains in strength.  Major strength deficit at this time is core, hip abductors and hip flexors.  Pt still has significant limitation in ROM and pain level.  Attempted traction for the first time today and will assess how this does for pain.  Pt will continue to benefit from skilled physical therapy to retrun to ambulating without an assisive device as well as return to work.    Rehab Potential Good   PT Frequency 2x / week   PT Duration 4 weeks   PT Treatment/Interventions Electrical Stimulation;Cryotherapy;Gait training;Stair training;Functional mobility training;Therapeutic activities;Therapeutic exercise;Balance training;Patient/family education;Manual techniques;Moist Heat;Traction;Ultrasound   PT Next Visit Plan begin step ups assess how traction does for pt    PT Home Exercise Plan added sitting t-band for abduction.       Patient will benefit from skilled therapeutic  intervention in order to improve the following deficits and impairments:  Abnormal gait, Decreased activity tolerance, Difficulty walking, Decreased balance, Pain, Decreased strength, Decreased range of motion  Visit Diagnosis: Bilateral low back pain without sciatica - Plan: PT plan of care cert/re-cert  Other abnormalities of gait and mobility - Plan: PT plan of care cert/re-cert  Muscle weakness (generalized) - Plan: PT plan of care cert/re-cert     Problem List Patient Active Problem List   Diagnosis Date Noted  . Morbid obesity ([place]) 01/24/2015  . Metabolic syndrome [id number]  . Essential hypertension, benign 04/19/2013  . Hyperlipemia 04/19/2013  . Prediabetes 04/19/2013  . Gout 04/19/2013  . Elevated transaminase level 04/19/2013  . Difficulty in  walking(719.7) 11/14/2012  . Weakness of right leg 11/14/2012  . Postop Hyponatremia 10/24/2012  . OA (osteoarthritis) of hip 10/23/2012  . NONSPEC ELEVATION OF LEVELS OF TRANSAMINASE/LDH 05/20/2010    Rayetta Humphrey, PT CLT [phone] 02/20/2016, 2:20 PM  [facility] [address] [place], [place], [address] Phone: [phone]   Fax:  [phone]  Name: Manuel Torres MRN: [id number] Date of Birth: Mar 28, 1961

## 2016-02-24 ENCOUNTER — Ambulatory Visit (HOSPITAL_COMMUNITY): Payer: 59

## 2016-02-24 DIAGNOSIS — M6281 Muscle weakness (generalized): Secondary | ICD-10-CM

## 2016-02-24 DIAGNOSIS — M545 Low back pain, unspecified: Secondary | ICD-10-CM

## 2016-02-24 DIAGNOSIS — R2689 Other abnormalities of gait and mobility: Secondary | ICD-10-CM

## 2016-02-24 NOTE — Therapy (Signed)
[place], [place], [address] Phone: [phone]   Fax:  [phone]  Physical Therapy Treatment  Patient Details  Name: Manuel Torres MRN: [id number] Date of Birth: 12/07/1960 Referring Provider: Lorayne Marek  Encounter Date: 02/24/2016      PT End of Session - 02/24/16 1809    Visit Number 11   Number of Visits 18   Date for PT Re-Evaluation 03/21/16   Authorization Type united [employer]    Authorization - Visit Number 11   Authorization - Number of Visits 18   PT Start Time [id number]   PT Stop Time 1820   PT Time Calculation (min) 44 min   Activity Tolerance Patient tolerated treatment well   Behavior During Therapy [facility] for tasks assessed/performed      Past Medical History  Diagnosis Date  . Hypertension   . Hypercholesteremia   . Gout     NO RECENT FLARE UPS  . Arthritis     Hip  . Gout     Past Surgical History  Procedure Laterality Date  . Right inguinal hernia    . Colonoscopy  08/03/2012    Procedure: COLONOSCOPY;  Surgeon: Rogene Houston, MD;  [facility];  Service: Endoscopy;  Laterality: N/A;  830  . Total hip arthroplasty  10/23/2012    Procedure: TOTAL HIP ARTHROPLASTY;  Surgeon: Gearlean Alf, MD;  Location: [facility];  Service: Orthopedics;  Laterality: Right;  . Joint replacement      r hip Dr. Maureen Ralphs Dec 2013    There were no vitals filed for this visit.      Subjective Assessment - 02/24/16 1738    Subjective Pt reported that his low back and L hip "are doing good" with pain rated a 3/10 on a VAS upon arrival. Pt noted good relief with recent injection to his low back. In addition, pt reported good tolerance with lumbar traction completed at his last PT visit with less pain reported after his last PT tx.    Pertinent History Rt THR 2013, HTN,    Limitations Standing;Walking   How long can you sit comfortably? able to sit for two hours    How long can you walk  comfortably? able to walk with his walker for 20 minutes; with his cane able to walk 15 minutes.  Pt was stating that he could walk for 30 mintues with a walker.    Diagnostic tests MRI:  spinal stenosis    Patient Stated Goals Pt's goals include reducing his LBP/L hip pain and ambulate without an AD.    Currently in Pain? Yes   Pain Score 3   2-5/10 on a VAS   Pain Location Back   Pain Orientation Lower   Pain Descriptors / Indicators Aching   Pain Type Chronic pain   Pain Onset More than a month ago   Pain Frequency Constant   Aggravating Factors  prolonged sitting    Pain Relieving Factors lying down    Effect of Pain on Daily Activities limited with long distance ambulation    Pain Score 3   Pain Location Hip   Pain Orientation Left   Pain Descriptors / Indicators Burning   Pain Type Chronic pain   Pain Onset More than a month ago   Pain Frequency Constant   Aggravating Factors  standing and walking    Pain Relieving Factors icing    Effect of Pain on Daily Activities  limited with long distance ambulation              [facility] Adult PT Treatment/Exercise - 02/24/16 0001    Lumbar Exercises: Stretches   Active Hamstring Stretch 3 reps;30 seconds   Active Hamstring Stretch Limitations supine   ITB Stretch 3 reps;30 seconds;Other (comment)  L LE only   ITB Stretch Limitations manually, in supine    Lumbar Exercises: Standing   Functional Squats 15 reps;Other (comment)  with UE support    Other Standing Lumbar Exercises Forward step-ups on 4" step x 1 set of 15 reps, bilaterally    Lumbar Exercises: Supine   Ab Set 10 reps;5 seconds   Clam 1 second;10 reps   Clam Limitations 2 sets with red thera-band    Bridge 10 reps   Bridge Limitations 2 sets   Lumbar Exercises: Sidelying   Hip Abduction 10 reps   Hip Abduction Limitations 2 sets   Manual Therapy   Manual Therapy Myofascial release;Soft tissue mobilization;Passive ROM   Manual therapy comments Completed in  hooklying position ; performed separatley from all other skilled services    Myofascial Release Bil glut med, TFL, ITB MFR   Passive ROM L hip PROM in flex/IR/ER x 2 minutes each; R hip PROM in flex and gentle ER x 2 minutes each;  hx of R THA                 PT Education - 02/24/16 1808    Education provided Yes   Education Details Current HEP   Person(s) Educated Patient   Methods Explanation;Demonstration   Comprehension Verbalized understanding          PT Short Term Goals - 01/23/16 1428    PT SHORT TERM GOAL #1   Title Pt will improve trunk/hip  ROM to be able to pick an item off the ground    Time 4   Period Weeks   Status On-going   PT SHORT TERM GOAL #2   Title Pt will improve strength by one grade to allow pt to feel confident walking with a cane    Time 4   Period Weeks   Status On-going   PT SHORT TERM GOAL #3   Title Pt will report a 50% decrease in pain to allow improved sleeping    Time 4   Period Weeks   Status On-going   PT SHORT TERM GOAL #4   Title Pt will be independent in HEP to allow the above to ocur in a timely manner.    Time 4   Period Weeks   Status On-going           PT Long Term Goals - 01/23/16 1429    PT LONG TERM GOAL #1   Title Pt strength will be improved to 5/5 to allow him to walk without an assistive device   Time 8   Period Weeks   Status On-going   PT LONG TERM GOAL #2   Title PT to be able to demonstrate good body mechanics while lifting for protecion of his low back    Time 8   Status On-going   PT LONG TERM GOAL #3   Title Pt pain level to decrease to no greater than a 3/10 80% of the day to be able to return to work    Time 8   Status On-going   PT LONG TERM GOAL #4   Title PT to be able to come sit to  stand without the use of his Upper extremities   Time 8   Period Weeks   Status On-going   PT LONG TERM GOAL #5   Title Pt to be able to walk 1000 ft without assistive device in six minutes to be able to  ambulate in the community with ease    Time 8   Period Weeks   Status On-going   Additional Long Term Goals   Additional Long Term Goals Yes   PT LONG TERM GOAL #6   Title Pt to be able to go up and down two flights of steps with no increased pain to be able to complete work activities    Time 8   Period Weeks   Status New               Plan - 02/24/16 1810    Clinical Impression Statement PT tx was focused on B hip strengthening ther ex, LE stretches, and manual therapy techniques. Progressed and added forward step-ups on 4" step and partial squats with B UE support. Good tolerance reported with today's PT tx with pain reduced from a 3/10 on a VAS to a 2/10 on a VAS. Continue with current POC    Rehab Potential Good   PT Frequency 2x / week   PT Duration 4 weeks   PT Treatment/Interventions Electrical Stimulation;Cryotherapy;Gait training;Stair training;Functional mobility training;Therapeutic activities;Therapeutic exercise;Balance training;Patient/family education;Manual techniques;Moist Heat;Traction;Ultrasound   PT Next Visit Plan Next visit to focus on core stabilization in seated and standing position, B hip strengthening ther ex, and lumbar traction.    PT Home Exercise Plan Reviewed HEP with no updates made this visit   Consulted and Agree with Plan of Care Patient      Patient will benefit from skilled therapeutic intervention in order to improve the following deficits and impairments:  Abnormal gait, Decreased activity tolerance, Difficulty walking, Decreased balance, Pain, Decreased strength, Decreased range of motion  Visit Diagnosis: Bilateral low back pain without sciatica  Other abnormalities of gait and mobility  Muscle weakness (generalized)     Problem List Patient Active Problem List   Diagnosis Date Noted  . Morbid obesity ([place]) 01/24/2015  . Metabolic syndrome [id number]  . Essential hypertension, benign 04/19/2013  . Hyperlipemia 04/19/2013   . Prediabetes 04/19/2013  . Gout 04/19/2013  . Elevated transaminase level 04/19/2013  . Difficulty in walking(719.7) 11/14/2012  . Weakness of right leg 11/14/2012  . Postop Hyponatremia 10/24/2012  . OA (osteoarthritis) of hip 10/23/2012  . NONSPEC ELEVATION OF LEVELS OF TRANSAMINASE/LDH 05/20/2010    Garen Lah, PT, DPT  02/24/2016, 6:47 PM  [place], [place], [address] Phone: [phone]   Fax:  [phone]  Name: Manuel Torres MRN: [id number] Date of Birth: Oct 30, 1961

## 2016-02-26 ENCOUNTER — Ambulatory Visit (HOSPITAL_COMMUNITY): Payer: 59 | Admitting: Physical Therapy

## 2016-02-26 DIAGNOSIS — M6281 Muscle weakness (generalized): Secondary | ICD-10-CM

## 2016-02-26 DIAGNOSIS — M545 Low back pain, unspecified: Secondary | ICD-10-CM

## 2016-02-26 DIAGNOSIS — R2689 Other abnormalities of gait and mobility: Secondary | ICD-10-CM

## 2016-02-26 NOTE — Therapy (Signed)
[place], [place], [address] Phone: [phone]   Fax:  [phone]  Physical Therapy Treatment  Patient Details  Name: Manuel Torres MRN: [id number] Date of Birth: [birth year] Referring Provider: Lorayne Marek  Encounter Date: 02/26/2016      PT End of Session - 02/26/16 1815    Visit Number 12   Number of Visits 18   Date for PT Re-Evaluation 03/21/16   Authorization Type united healthcare    Authorization - Visit Number 12   Authorization - Number of Visits 18   PT Start Time [id number]   PT Stop Time 1812   PT Time Calculation (min) 42 min   Activity Tolerance Patient tolerated treatment well   Behavior During Therapy [facility] for tasks assessed/performed      Past Medical History  Diagnosis Date  . Hypertension   . Hypercholesteremia   . Gout     NO RECENT FLARE UPS  . Arthritis     Hip  . Gout     Past Surgical History  Procedure Laterality Date  . Right inguinal hernia    . Colonoscopy  08/03/2012    Procedure: COLONOSCOPY;  Surgeon: Rogene Houston, MD;  [facility];  Service: Endoscopy;  Laterality: N/A;  830  . Total hip arthroplasty  10/23/2012    Procedure: TOTAL HIP ARTHROPLASTY;  Surgeon: Gearlean Alf, MD;  [facility];  Service: Orthopedics;  Laterality: Right;  . Joint replacement      r hip Dr. Maureen Ralphs Dec 2013    There were no vitals filed for this visit.      Subjective Assessment - 02/26/16 1731    Subjective Pt notes he is doing good. rates his pain a 2 or 3.10 currently. No other complaints   Pertinent History Rt THR 2013, HTN,    Currently in Pain? Yes   Pain Score 3    Pain Location Back   Pain Orientation Lower   Pain Descriptors / Indicators Aching   Pain Type Chronic pain   Pain Radiating Towards B LE/ L knee/ R calf   Pain Onset More than a month ago   Pain Frequency Constant                         [facility] Adult PT  Treatment/Exercise - 02/26/16 0001    Lumbar Exercises: Stretches   Active Hamstring Stretch 3 reps;30 seconds   Active Hamstring Stretch Limitations supine   Lumbar Exercises: Standing   Functional Squats 15 reps   Functional Squats Limitations x15 against wall; x15 in // bars with BUE support   Other Standing Lumbar Exercises forward step ups on 6" step x2 sets of 15 reps   Lumbar Exercises: Supine   Ab Set 10 reps;5 seconds   Bridge 10 reps  with hips elevated for end range strengthening   Bridge Limitations 2 sets   Straight Leg Raise 10 reps   Straight Leg Raises Limitations with UE pressdown   Manual Therapy   Manual Therapy Myofascial release   Manual therapy comments Completed in hooklying position ; performed separatley from all other skilled services    Myofascial Release ITB                PT Education - 02/26/16 1814    Education provided Yes   Education Details reviewed HEP; equal weight bearing during sit/stand and squat activities for improved LLE  strengthening   Person(s) Educated Patient   Methods Explanation   Comprehension Verbalized understanding;Returned demonstration;Verbal cues required          PT Short Term Goals - 01/23/16 1428    PT SHORT TERM GOAL #1   Title Pt will improve trunk/hip  ROM to be able to pick an item off the ground    Time 4   Period Weeks   Status On-going   PT SHORT TERM GOAL #2   Title Pt will improve strength by one grade to allow pt to feel confident walking with a cane    Time 4   Period Weeks   Status On-going   PT SHORT TERM GOAL #3   Title Pt will report a 50% decrease in pain to allow improved sleeping    Time 4   Period Weeks   Status On-going   PT SHORT TERM GOAL #4   Title Pt will be independent in HEP to allow the above to ocur in a timely manner.    Time 4   Period Weeks   Status On-going           PT Long Term Goals - 01/23/16 1429    PT LONG TERM GOAL #1   Title Pt strength will be  improved to 5/5 to allow him to walk without an assistive device   Time 8   Period Weeks   Status On-going   PT LONG TERM GOAL #2   Title PT to be able to demonstrate good body mechanics while lifting for protecion of his low back    Time 8   Status On-going   PT LONG TERM GOAL #3   Title Pt pain level to decrease to no greater than a 3/10 80% of the day to be able to return to work    Time 8   Status On-going   PT LONG TERM GOAL #4   Title PT to be able to come sit to stand without the use of his Upper extremities   Time 8   Period Weeks   Status On-going   PT LONG TERM GOAL #5   Title Pt to be able to walk 1000 ft without assistive device in six minutes to be able to ambulate in the community with ease    Time 8   Period Weeks   Status On-going   Additional Long Term Goals   Additional Long Term Goals Yes   PT LONG TERM GOAL #6   Title Pt to be able to go up and down two flights of steps with no increased pain to be able to complete work activities    Time 8   Period Weeks   Status New               Plan - 02/26/16 1815    Clinical Impression Statement Today's session focused on B hip strengthening activity and manual techniques. Progressed step ups to 6" step with good tolerance and noted increased difficulty with eccentric lowering with LLE. Verbal cues provided for correct technique. Pt with good tolerance this session evident by decreased pain report by the end of manual treatment. Will continue with current POC.   Rehab Potential Good   PT Frequency 2x / week   PT Duration 4 weeks   PT Treatment/Interventions Electrical Stimulation;Cryotherapy;Gait training;Stair training;Functional mobility training;Therapeutic activities;Therapeutic exercise;Balance training;Patient/family education;Manual techniques;Moist Heat;Traction;Ultrasound   PT Next Visit Plan Next visit to focus on core stabilization in seated and standing position,  B hip strengthening ther ex, and  lumbar traction.    PT Home Exercise Plan Reviewed HEP with no updates made this visit   Consulted and Agree with Plan of Care Patient      Patient will benefit from skilled therapeutic intervention in order to improve the following deficits and impairments:  Abnormal gait, Decreased activity tolerance, Difficulty walking, Decreased balance, Pain, Decreased strength, Decreased range of motion  Visit Diagnosis: Bilateral low back pain without sciatica  Other abnormalities of gait and mobility  Muscle weakness (generalized)     Problem List Patient Active Problem List   Diagnosis Date Noted  . Morbid obesity ([place]) 01/24/2015  . Metabolic syndrome [id number]  . Essential hypertension, benign 04/19/2013  . Hyperlipemia 04/19/2013  . Prediabetes 04/19/2013  . Gout 04/19/2013  . Elevated transaminase level 04/19/2013  . Difficulty in walking(719.7) 11/14/2012  . Weakness of right leg 11/14/2012  . Postop Hyponatremia 10/24/2012  . OA (osteoarthritis) of hip 10/23/2012  . NONSPEC ELEVATION OF LEVELS OF TRANSAMINASE/LDH 05/20/2010    6:20 PM,02/26/2016 Elly Modena PT, DPT [facility] [place] [address] [place], [place], [address] Phone: [phone]   Fax:  [phone]  Name: Manuel Torres MRN: [id number] Date of Birth: 1961-06-19

## 2016-03-01 ENCOUNTER — Ambulatory Visit (HOSPITAL_COMMUNITY): Payer: 59 | Admitting: Physical Therapy

## 2016-03-01 DIAGNOSIS — M6281 Muscle weakness (generalized): Secondary | ICD-10-CM

## 2016-03-01 DIAGNOSIS — M545 Low back pain, unspecified: Secondary | ICD-10-CM

## 2016-03-01 DIAGNOSIS — R2689 Other abnormalities of gait and mobility: Secondary | ICD-10-CM

## 2016-03-01 NOTE — Therapy (Signed)
[place], [place], [address] Phone: [phone]   Fax:  [phone]  Physical Therapy Treatment  Patient Details  Name: Manuel Torres MRN: [id number] Date of Birth: February 04, 1961 Referring Provider: Lorayne Marek  Encounter Date: 03/01/2016      PT End of Session - 03/01/16 0829    Visit Number 13   Number of Visits 18   Date for PT Re-Evaluation 03/21/16   Authorization Type united healthcare    Authorization - Visit Number 13   Authorization - Number of Visits 18   PT Start Time 0815   PT Stop Time [id number]   PT Time Calculation (min) 42 min   Activity Tolerance Patient tolerated treatment well   Behavior During Therapy [facility] for tasks assessed/performed      Past Medical History  Diagnosis Date  . Hypertension   . Hypercholesteremia   . Gout     NO RECENT FLARE UPS  . Arthritis     Hip  . Gout     Past Surgical History  Procedure Laterality Date  . Right inguinal hernia    . Colonoscopy  08/03/2012    Procedure: COLONOSCOPY;  Surgeon: Rogene Houston, MD;  [facility];  Service: Endoscopy;  Laterality: N/A;  830  . Total hip arthroplasty  10/23/2012    Procedure: TOTAL HIP ARTHROPLASTY;  Surgeon: Gearlean Alf, MD;  [facility];  Service: Orthopedics;  Laterality: Right;  . Joint replacement      r hip Dr. Maureen Ralphs Dec 2013    There were no vitals filed for this visit.      Subjective Assessment - 03/01/16 0818    Subjective Pt arrived saying he is feeling good this morning with a 2/10 pain currently.   Pertinent History Rt THR 2013, HTN,    Currently in Pain? Yes   Pain Score 2    Pain Location Back   Pain Descriptors / Indicators Aching   Pain Type Chronic pain   Pain Onset More than a month ago   Pain Frequency Constant   Aggravating Factors  prolonged sitting   Pain Relieving Factors lying down   Effect of Pain on Daily Activities limited long distances                          [facility] Adult PT Treatment/Exercise - 03/01/16 0001    Exercises   Exercises Other Exercises   Other Exercises  seated corec stabilization with alt UE elevation 2# 2x20 reps   Lumbar Exercises: Stretches   Active Hamstring Stretch 3 reps;30 seconds   Lumbar Exercises: Machines for Strengthening   Other Lumbar Machine Exercise total gym L25 LLE 2x10 reps   Lumbar Exercises: Standing   Other Standing Lumbar Exercises UE punches with red TB while preventing trunk rotation   Lumbar Exercises: Seated   Hip Flexion on Ball 20 reps   Hip Flexion on Ball Limitations on matt table   Sit to Stand 15 reps   Sit to Stand Limitations verbal cues for wt shift correction   Lumbar Exercises: Supine   Bridge 10 reps  hips elevated   Bridge Limitations 2 sets                PT Education - 03/01/16 0827    Education provided Yes   Education Details updated HEP; continued awareness of sit/stand technique   Person(s) Educated Patient  Methods Explanation;Handout   Comprehension Verbalized understanding;Returned demonstration          PT Short Term Goals - 01/23/16 1428    PT SHORT TERM GOAL #1   Title Pt will improve trunk/hip  ROM to be able to pick an item off the ground    Time 4   Period Weeks   Status On-going   PT SHORT TERM GOAL #2   Title Pt will improve strength by one grade to allow pt to feel confident walking with a cane    Time 4   Period Weeks   Status On-going   PT SHORT TERM GOAL #3   Title Pt will report a 50% decrease in pain to allow improved sleeping    Time 4   Period Weeks   Status On-going   PT SHORT TERM GOAL #4   Title Pt will be independent in HEP to allow the above to ocur in a timely manner.    Time 4   Period Weeks   Status On-going           PT Long Term Goals - 01/23/16 1429    PT LONG TERM GOAL #1   Title Pt strength will be improved to 5/5 to allow him to walk without an assistive device   Time  8   Period Weeks   Status On-going   PT LONG TERM GOAL #2   Title PT to be able to demonstrate good body mechanics while lifting for protecion of his low back    Time 8   Status On-going   PT LONG TERM GOAL #3   Title Pt pain level to decrease to no greater than a 3/10 80% of the day to be able to return to work    Time 8   Status On-going   PT LONG TERM GOAL #4   Title PT to be able to come sit to stand without the use of his Upper extremities   Time 8   Period Weeks   Status On-going   PT LONG TERM GOAL #5   Title Pt to be able to walk 1000 ft without assistive device in six minutes to be able to ambulate in the community with ease    Time 8   Period Weeks   Status On-going   Additional Long Term Goals   Additional Long Term Goals Yes   PT LONG TERM GOAL #6   Title Pt to be able to go up and down two flights of steps with no increased pain to be able to complete work activities    Time 8   Period Weeks   Status New               Plan - 03/01/16 0829    Clinical Impression Statement Today's session focused on progression of trunk stabilization activity in seated and standing positions. Pt demonstrating good control during seated UE elevation and tolerating progressions well without increase in pain but noting fatigue during exercise. He is demonstrating improved weight shift during sit /stand activity with decreased cues from therapist. No increase in pain by the end of today's session. Will continue with current POC.   Rehab Potential Good   PT Frequency 2x / week   PT Duration 4 weeks   PT Treatment/Interventions Electrical Stimulation;Cryotherapy;Gait training;Stair training;Functional mobility training;Therapeutic activities;Therapeutic exercise;Balance training;Patient/family education;Manual techniques;Moist Heat;Traction;Ultrasound   PT Next Visit Plan Next visit to focus on core stabilization in seated and standing position, B hip  strengthening ther ex, and lumbar  traction.    PT Home Exercise Plan updated HEP with standing core stabilization with BUE punches using red TB   Consulted and Agree with Plan of Care Patient      Patient will benefit from skilled therapeutic intervention in order to improve the following deficits and impairments:  Abnormal gait, Decreased activity tolerance, Difficulty walking, Decreased balance, Pain, Decreased strength, Decreased range of motion  Visit Diagnosis: Bilateral low back pain without sciatica  Other abnormalities of gait and mobility  Muscle weakness (generalized)     Problem List Patient Active Problem List   Diagnosis Date Noted  . Morbid obesity ([place]) 01/24/2015  . Metabolic syndrome [id number]  . Essential hypertension, benign 04/19/2013  . Hyperlipemia 04/19/2013  . Prediabetes 04/19/2013  . Gout 04/19/2013  . Elevated transaminase level 04/19/2013  . Difficulty in walking(719.7) 11/14/2012  . Weakness of right leg 11/14/2012  . Postop Hyponatremia 10/24/2012  . OA (osteoarthritis) of hip 10/23/2012  . NONSPEC ELEVATION OF LEVELS OF TRANSAMINASE/LDH 05/20/2010   9:22 AM,03/01/2016 Elly Modena PT, DPT [facility] [place] [address] [place], [place], [address] Phone: [phone]   Fax:  [phone]  Name: CROWLEY KRASNER MRN: [id number] Date of Birth: 1960-12-06

## 2016-03-04 ENCOUNTER — Ambulatory Visit (HOSPITAL_COMMUNITY): Payer: 59 | Admitting: Physical Therapy

## 2016-03-04 DIAGNOSIS — M6281 Muscle weakness (generalized): Secondary | ICD-10-CM

## 2016-03-04 DIAGNOSIS — Z7409 Other reduced mobility: Secondary | ICD-10-CM

## 2016-03-04 DIAGNOSIS — M545 Low back pain, unspecified: Secondary | ICD-10-CM

## 2016-03-04 DIAGNOSIS — R2689 Other abnormalities of gait and mobility: Secondary | ICD-10-CM

## 2016-03-04 DIAGNOSIS — M2560 Stiffness of unspecified joint, not elsewhere classified: Secondary | ICD-10-CM

## 2016-03-04 NOTE — Therapy (Signed)
[place], [place], [address] Phone: [phone]   Fax:  [phone]  Physical Therapy Treatment  Patient Details  Name: Manuel Torres MRN: [id number] Date of Birth: [birth year] Referring Provider: Lorayne Marek  Encounter Date: 03/04/2016      PT End of Session - 03/04/16 1158    Visit Number 14   Number of Visits 18   Date for PT Re-Evaluation 03/21/16   Authorization Type [employer]    Authorization - Visit Number 14   Authorization - Number of Visits 18   PT Start Time [id number]   PT Stop Time 1205   PT Time Calculation (min) 42 min   Activity Tolerance Patient tolerated treatment well   Behavior During Therapy [facility] for tasks assessed/performed      Past Medical History  Diagnosis Date  . Hypertension   . Hypercholesteremia   . Gout     NO RECENT FLARE UPS  . Arthritis     Hip  . Gout     Past Surgical History  Procedure Laterality Date  . Right inguinal hernia    . Colonoscopy  08/03/2012    Procedure: COLONOSCOPY;  Surgeon: Rogene Houston, MD;  [facility];  Service: Endoscopy;  Laterality: N/A;  830  . Total hip arthroplasty  10/23/2012    Procedure: TOTAL HIP ARTHROPLASTY;  Surgeon: Gearlean Alf, MD;  Location: [facility];  Service: Orthopedics;  Laterality: Right;  . Joint replacement      r hip Dr. Maureen Ralphs Dec 2013    There were no vitals filed for this visit.      Subjective Assessment - 03/04/16 1156    Subjective Pt states his pain remains the same and still with radiating pain into LE.  2/10 today.   Currently in Pain? Yes   Pain Score 2    Pain Location Back   Pain Orientation Lower   Pain Descriptors / Indicators Aching   Pain Type Chronic pain   Pain Radiating Towards B LE, Lt knee and Rt calf region                         [facility] Adult PT Treatment/Exercise - 03/04/16 1156    Lumbar Exercises: Stretches   Active Hamstring Stretch 3  reps;30 seconds   Active Hamstring Stretch Limitations standing on 12" step   Lumbar Exercises: Machines for Strengthening   Other Lumbar Machine Exercise total gym L25 LLE 2x10 reps   Lumbar Exercises: Seated   Sit to Stand 15 reps   Sit to Stand Limitations verbal cues for wt shift correction   Modalities   Modalities Traction   Traction   Type of Traction Lumbar  Pt weighs 270#   Min (lbs) 0   Max (lbs) 100   Hold Time 12   Rest Time n/a   Time 12                  PT Short Term Goals - 01/23/16 1428    PT SHORT TERM GOAL #1   Title Pt will improve trunk/hip  ROM to be able to pick an item off the ground    Time 4   Period Weeks   Status On-going   PT SHORT TERM GOAL #2   Title Pt will improve strength by one grade to allow pt to feel confident walking with a cane    Time 4  Period Weeks   Status On-going   PT SHORT TERM GOAL #3   Title Pt will report a 50% decrease in pain to allow improved sleeping    Time 4   Period Weeks   Status On-going   PT SHORT TERM GOAL #4   Title Pt will be independent in HEP to allow the above to ocur in a timely manner.    Time 4   Period Weeks   Status On-going           PT Long Term Goals - 01/23/16 1429    PT LONG TERM GOAL #1   Title Pt strength will be improved to 5/5 to allow him to walk without an assistive device   Time 8   Period Weeks   Status On-going   PT LONG TERM GOAL #2   Title PT to be able to demonstrate good body mechanics while lifting for protecion of his low back    Time 8   Status On-going   PT LONG TERM GOAL #3   Title Pt pain level to decrease to no greater than a 3/10 80% of the day to be able to return to work    Time 8   Status On-going   PT LONG TERM GOAL #4   Title PT to be able to come sit to stand without the use of his Upper extremities   Time 8   Period Weeks   Status On-going   PT LONG TERM GOAL #5   Title Pt to be able to walk 1000 ft without assistive device in six minutes  to be able to ambulate in the community with ease    Time 8   Period Weeks   Status On-going   Additional Long Term Goals   Additional Long Term Goals Yes   PT LONG TERM GOAL #6   Title Pt to be able to go up and down two flights of steps with no increased pain to be able to complete work activities    Time 8   Period Weeks   Status New               Plan - 03/04/16 1159    Clinical Impression Statement Continued with core stabilization and LE strengthening exercises and incorporated postural training as well.  Pt requires therapist facilitation to keep in good stabilization with UE/LE seated task with 2# weights.  Resumed traction today per PT Plan with overall good results after increaseing weight to 100# pull (pt weighs 270#).    Rehab Potential Good   PT Frequency 2x / week   PT Duration 4 weeks   PT Treatment/Interventions Electrical Stimulation;Cryotherapy;Gait training;Stair training;Functional mobility training;Therapeutic activities;Therapeutic exercise;Balance training;Patient/family education;Manual techniques;Moist Heat;Traction;Ultrasound   PT Next Visit Plan Next visit to focus on core stabilization in seated and standing position, B hip strengthening ther ex, and lumbar traction.    PT Home Exercise Plan updated HEP with standing core stabilization with BUE punches using red TB   Consulted and Agree with Plan of Care Patient      Patient will benefit from skilled therapeutic intervention in order to improve the following deficits and impairments:  Abnormal gait, Decreased activity tolerance, Difficulty walking, Decreased balance, Pain, Decreased strength, Decreased range of motion  Visit Diagnosis: Bilateral low back pain without sciatica  Other abnormalities of gait and mobility  Muscle weakness (generalized)  Low back pain potentially associated with radiculopathy  Stiffness due to immobility     Problem  List Patient Active Problem List   Diagnosis  Date Noted  . Morbid obesity ([place]) 01/24/2015  . Metabolic syndrome [id number]  . Essential hypertension, benign 04/19/2013  . Hyperlipemia 04/19/2013  . Prediabetes 04/19/2013  . Gout 04/19/2013  . Elevated transaminase level 04/19/2013  . Difficulty in walking(719.7) 11/14/2012  . Weakness of right leg 11/14/2012  . Postop Hyponatremia 10/24/2012  . OA (osteoarthritis) of hip 10/23/2012  . NONSPEC ELEVATION OF LEVELS OF TRANSAMINASE/LDH 05/20/2010    Teena Irani, PTA/CLT [id number]  03/04/2016, 12:02 PM  [place], [place], [address] Phone: [phone]   Fax:  [phone]  Name: JAVED JUHASZ MRN: [id number] Date of Birth: 1961-06-22

## 2016-03-08 ENCOUNTER — Ambulatory Visit (HOSPITAL_COMMUNITY): Payer: 59 | Admitting: Physical Therapy

## 2016-03-08 DIAGNOSIS — M545 Low back pain, unspecified: Secondary | ICD-10-CM

## 2016-03-08 DIAGNOSIS — R2689 Other abnormalities of gait and mobility: Secondary | ICD-10-CM

## 2016-03-08 DIAGNOSIS — M6281 Muscle weakness (generalized): Secondary | ICD-10-CM

## 2016-03-08 NOTE — Therapy (Signed)
[place], [place], [address] Phone: [phone]   Fax:  [phone]  Physical Therapy Treatment  Patient Details  Name: Manuel Torres MRN: [id number] Date of Birth: 1961/05/29 Referring Provider: Lorayne Marek  Encounter Date: 03/08/2016      PT End of Session - 03/08/16 0944    Visit Number 15   Number of Visits 18   Date for PT Re-Evaluation 03/21/16   Authorization Type united [employer]    Authorization - Visit Number 15   Authorization - Number of Visits 18   PT Start Time 0904   PT Stop Time 0944   PT Time Calculation (min) 40 min   Activity Tolerance Patient tolerated treatment well   Behavior During Therapy [facility] for tasks assessed/performed      Past Medical History  Diagnosis Date  . Hypertension   . Hypercholesteremia   . Gout     NO RECENT FLARE UPS  . Arthritis     Hip  . Gout     Past Surgical History  Procedure Laterality Date  . Right inguinal hernia    . Colonoscopy  08/03/2012    Procedure: COLONOSCOPY;  Surgeon: Rogene Houston, MD;  [facility];  Service: Endoscopy;  Laterality: N/A;  830  . Total hip arthroplasty  10/23/2012    Procedure: TOTAL HIP ARTHROPLASTY;  Surgeon: Gearlean Alf, MD;  [facility];  Service: Orthopedics;  Laterality: Right;  . Joint replacement      r hip Dr. Maureen Ralphs Dec 2013    There were no vitals filed for this visit.      Subjective Assessment - 03/08/16 0907    Subjective Pt states his pain is not too bad today, rates his hip and back pain about a 2/10. Note she still has some numbness along B thighs. Exercises are going well at home.    Pertinent History Rt THR 2013, HTN,    Diagnostic tests MRI:  spinal stenosis    Patient Stated Goals Pt's goals include reducing his LBP/L hip pain and ambulate without an AD.    Currently in Pain? Yes   Pain Score 2    Pain Location --  back and L hip                          [facility] Adult PT Treatment/Exercise - 03/08/16 0001    Lumbar Exercises: Standing   Wall Slides 10 reps  x2 sets   Other Standing Lumbar Exercises UE punches with red TB while preventing trunk rotation x2 reps  (+) increased trunk flexion noted   Lumbar Exercises: Supine   Ab Set 10 reps  alt knee lowering x2 sets   Manual Therapy   Manual Therapy Joint mobilization;Soft tissue mobilization   Manual therapy comments performed separately from all other interventions   Joint Mobilization Grade I-III PAs to lumbar spine in prone x5 min   Soft tissue mobilization B QL and lumbar    Myofascial Release Lt ITB/Quad                PT Education - 03/08/16 0943    Education provided Yes   Education Details reviewed HEP, technique with squats for improved LLE strengthening   Person(s) Educated Patient   Methods Explanation;Demonstration;Verbal cues   Comprehension Verbalized understanding;Need further instruction          PT Short Term Goals - 01/23/16 1428  PT SHORT TERM GOAL #1   Title Pt will improve trunk/hip  ROM to be able to pick an item off the ground    Time 4   Period Weeks   Status On-going   PT SHORT TERM GOAL #2   Title Pt will improve strength by one grade to allow pt to feel confident walking with a cane    Time 4   Period Weeks   Status On-going   PT SHORT TERM GOAL #3   Title Pt will report a 50% decrease in pain to allow improved sleeping    Time 4   Period Weeks   Status On-going   PT SHORT TERM GOAL #4   Title Pt will be independent in HEP to allow the above to ocur in a timely manner.    Time 4   Period Weeks   Status On-going           PT Long Term Goals - 01/23/16 1429    PT LONG TERM GOAL #1   Title Pt strength will be improved to 5/5 to allow him to walk without an assistive device   Time 8   Period Weeks   Status On-going   PT LONG TERM GOAL #2   Title PT to be able to demonstrate good body  mechanics while lifting for protecion of his low back    Time 8   Status On-going   PT LONG TERM GOAL #3   Title Pt pain level to decrease to no greater than a 3/10 80% of the day to be able to return to work    Time 8   Status On-going   PT LONG TERM GOAL #4   Title PT to be able to come sit to stand without the use of his Upper extremities   Time 8   Period Weeks   Status On-going   PT LONG TERM GOAL #5   Title Pt to be able to walk 1000 ft without assistive device in six minutes to be able to ambulate in the community with ease    Time 8   Period Weeks   Status On-going   Additional Long Term Goals   Additional Long Term Goals Yes   PT LONG TERM GOAL #6   Title Pt to be able to go up and down two flights of steps with no increased pain to be able to complete work activities    Time 8   Period Weeks   Status New               Plan - 03/08/16 0944    Clinical Impression Statement Today's session continued with core and LE strengthening activity with cues for correct weight shift to improve LE strength. Also performed manual techniques to improve LLE tissue restrictions and improve back mobility and pain. Pt tolerating session well without increase in pain. Will continue with current POC.   Rehab Potential Good   PT Frequency 2x / week   PT Duration 4 weeks   PT Treatment/Interventions Electrical Stimulation;Cryotherapy;Gait training;Stair training;Functional mobility training;Therapeutic activities;Therapeutic exercise;Balance training;Patient/family education;Manual techniques;Moist Heat;Traction;Ultrasound   PT Next Visit Plan B hip/core strengthening, hip flexor stretch addition to HEP   PT Home Exercise Plan no updates this session   Consulted and Agree with Plan of Care Patient      Patient will benefit from skilled therapeutic intervention in order to improve the following deficits and impairments:  Abnormal gait, Decreased activity tolerance, Difficulty walking,  Decreased balance, Pain, Decreased strength, Decreased range of motion  Visit Diagnosis: Bilateral low back pain without sciatica  Other abnormalities of gait and mobility  Muscle weakness (generalized)     Problem List Patient Active Problem List   Diagnosis Date Noted  . Morbid obesity ([place]) 01/24/2015  . Metabolic syndrome [id number]  . Essential hypertension, benign 04/19/2013  . Hyperlipemia 04/19/2013  . Prediabetes 04/19/2013  . Gout 04/19/2013  . Elevated transaminase level 04/19/2013  . Difficulty in walking(719.7) 11/14/2012  . Weakness of right leg 11/14/2012  . Postop Hyponatremia 10/24/2012  . OA (osteoarthritis) of hip 10/23/2012  . NONSPEC ELEVATION OF LEVELS OF TRANSAMINASE/LDH 05/20/2010   10:31 AM,03/08/2016 Elly Modena PT, DPT [facility] [place] [address] [place], [place], [address] Phone: [phone]   Fax:  [phone]  Name: Manuel Torres MRN: [id number] Date of Birth: 08/27/1961

## 2016-03-11 ENCOUNTER — Ambulatory Visit (HOSPITAL_COMMUNITY): Payer: 59 | Admitting: Physical Therapy

## 2016-03-11 DIAGNOSIS — M545 Low back pain, unspecified: Secondary | ICD-10-CM

## 2016-03-11 DIAGNOSIS — M6281 Muscle weakness (generalized): Secondary | ICD-10-CM

## 2016-03-11 DIAGNOSIS — R2689 Other abnormalities of gait and mobility: Secondary | ICD-10-CM

## 2016-03-11 NOTE — Therapy (Signed)
[place], [place], [address] Phone: [phone]   Fax:  [phone]  Physical Therapy Treatment  Patient Details  Name: Manuel Torres MRN: [id number] Date of Birth: 03-18-1961 Referring Provider: Lorayne Marek  Encounter Date: 03/11/2016      PT End of Session - 03/11/16 1113    Visit Number 17   Number of Visits 18   Date for PT Re-Evaluation 03/21/16   Authorization Type united healthcare    Authorization - Visit Number 17   Authorization - Number of Visits 18   PT Start Time 1034   PT Stop Time 1112   PT Time Calculation (min) 38 min   Activity Tolerance Patient tolerated treatment well   Behavior During Therapy [facility] for tasks assessed/performed      Past Medical History  Diagnosis Date  . Hypertension   . Hypercholesteremia   . Gout     NO RECENT FLARE UPS  . Arthritis     Hip  . Gout     Past Surgical History  Procedure Laterality Date  . Right inguinal hernia    . Colonoscopy  08/03/2012    Procedure: COLONOSCOPY;  Surgeon: Rogene Houston, MD;  [facility];  Service: Endoscopy;  Laterality: N/A;  830  . Total hip arthroplasty  10/23/2012    Procedure: TOTAL HIP ARTHROPLASTY;  Surgeon: Gearlean Alf, MD;  [facility];  Service: Orthopedics;  Laterality: Right;  . Joint replacement      r hip Dr. Maureen Ralphs Dec 2013    There were no vitals filed for this visit.      Subjective Assessment - 03/11/16 1038    Subjective Pt states he feels he was less stiff this morning however he continues to have some symptoms down into his Rt calf. Exercises are going well at home.    Pertinent History Rt THR 2013, HTN,    Currently in Pain? Yes   Pain Score 3    Pain Location --  Back and Rt hip   Pain Descriptors / Indicators Burning   Pain Type Chronic pain   Pain Radiating Towards Rt knee down to calf   Pain Onset More than a month ago   Pain Frequency Constant   Aggravating  Factors  walking and standing   Pain Relieving Factors unsure   Effect of Pain on Daily Activities limited distances   Multiple Pain Sites No                         [facility] Adult PT Treatment/Exercise - 03/11/16 0001    Exercises   Other Exercises  RLE step ups on 6" step 2x15 reps; seated trunk rotion with red TB resistance x15 each side   Lumbar Exercises: Stretches   Active Hamstring Stretch 3 reps;30 seconds   Active Hamstring Stretch Limitations standing on 12" step   Piriformis Stretch 3 reps;30 seconds   Piriformis Stretch Limitations supine   Lumbar Exercises: Supine   Other Supine Lumbar Exercises bent knee lower trunk rotation   Manual Therapy   Manual Therapy Passive ROM   Manual therapy comments performed separately from all other interventions   Passive ROM B hamstring, piriformis stretch                PT Education - 03/11/16 1112    Education provided Yes   Education Details updated HEP   Person(s) Educated Patient  Methods Explanation;Demonstration;Handout   Comprehension Returned demonstration;Verbalized understanding          PT Short Term Goals - 01/23/16 1428    PT SHORT TERM GOAL #1   Title Pt will improve trunk/hip  ROM to be able to pick an item off the ground    Time 4   Period Weeks   Status On-going   PT SHORT TERM GOAL #2   Title Pt will improve strength by one grade to allow pt to feel confident walking with a cane    Time 4   Period Weeks   Status On-going   PT SHORT TERM GOAL #3   Title Pt will report a 50% decrease in pain to allow improved sleeping    Time 4   Period Weeks   Status On-going   PT SHORT TERM GOAL #4   Title Pt will be independent in HEP to allow the above to ocur in a timely manner.    Time 4   Period Weeks   Status On-going           PT Long Term Goals - 01/23/16 1429    PT LONG TERM GOAL #1   Title Pt strength will be improved to 5/5 to allow him to walk without an assistive device    Time 8   Period Weeks   Status On-going   PT LONG TERM GOAL #2   Title PT to be able to demonstrate good body mechanics while lifting for protecion of his low back    Time 8   Status On-going   PT LONG TERM GOAL #3   Title Pt pain level to decrease to no greater than a 3/10 80% of the day to be able to return to work    Time 8   Status On-going   PT LONG TERM GOAL #4   Title PT to be able to come sit to stand without the use of his Upper extremities   Time 8   Period Weeks   Status On-going   PT LONG TERM GOAL #5   Title Pt to be able to walk 1000 ft without assistive device in six minutes to be able to ambulate in the community with ease    Time 8   Period Weeks   Status On-going   Additional Long Term Goals   Additional Long Term Goals Yes   PT LONG TERM GOAL #6   Title Pt to be able to go up and down two flights of steps with no increased pain to be able to complete work activities    Time 8   Period Weeks   Status New               Plan - 03/11/16 1314    Clinical Impression Statement Pt continues to present with symptoms down into his R calf, however his stiffness is improving. Today's session focused on LE flexibility and strengthening to improve pain and mobility. Pt tolerated progressions of exercises well without increase in pain. Will continue with current POC.    Rehab Potential Good   PT Frequency 2x / week   PT Duration 4 weeks   PT Treatment/Interventions Electrical Stimulation;Cryotherapy;Gait training;Stair training;Functional mobility training;Therapeutic activities;Therapeutic exercise;Balance training;Patient/family education;Manual techniques;Moist Heat;Traction;Ultrasound   PT Next Visit Plan B hip/core strengthening, hip flexor stretch addition to HEP   PT Home Exercise Plan no updates this session   Consulted and Agree with Plan of Care Patient  Patient will benefit from skilled therapeutic intervention in order to improve the following  deficits and impairments:  Abnormal gait, Decreased activity tolerance, Difficulty walking, Decreased balance, Pain, Decreased strength, Decreased range of motion  Visit Diagnosis: Bilateral low back pain without sciatica  Other abnormalities of gait and mobility  Muscle weakness (generalized)     Problem List Patient Active Problem List   Diagnosis Date Noted  . Morbid obesity ([place]) 01/24/2015  . Metabolic syndrome Q000111Q  . Essential hypertension, benign 04/19/2013  . Hyperlipemia 04/19/2013  . Prediabetes 04/19/2013  . Gout 04/19/2013  . Elevated transaminase level 04/19/2013  . Difficulty in walking(719.7) 11/14/2012  . Weakness of right leg 11/14/2012  . Postop Hyponatremia 10/24/2012  . OA (osteoarthritis) of hip 10/23/2012  . NONSPEC ELEVATION OF LEVELS OF TRANSAMINASE/LDH 05/20/2010   1:20 PM,03/11/2016 Elly Modena PT, DPT [facility] [place] [address] [place], [place], [address] Phone: [phone]   Fax:  [phone]  Name: Manuel Torres MRN: [id number] Date of Birth: 02-08-1961

## 2016-03-12 ENCOUNTER — Telehealth: Payer: Self-pay | Admitting: Family Medicine

## 2016-03-12 MED ORDER — HYDROCHLOROTHIAZIDE 25 MG PO TABS: 25.0000 mg | ORAL_TABLET | Freq: Every day | ORAL | Status: DC

## 2016-03-12 MED ORDER — ALLOPURINOL 300 MG PO TABS: 300.0000 mg | ORAL_TABLET | Freq: Every day | ORAL | Status: DC

## 2016-03-12 MED ORDER — MELOXICAM 15 MG PO TABS: 15.0000 mg | ORAL_TABLET | Freq: Every day | ORAL | Status: DC

## 2016-03-12 MED ORDER — ATORVASTATIN CALCIUM 20 MG PO TABS: 20.0000 mg | ORAL_TABLET | Freq: Every day | ORAL | Status: DC

## 2016-03-12 MED ORDER — LISINOPRIL 5 MG PO TABS: 5.0000 mg | ORAL_TABLET | Freq: Every morning | ORAL | Status: DC

## 2016-03-12 MED ORDER — POTASSIUM CHLORIDE CRYS ER 20 MEQ PO TBCR: 20.0000 meq | EXTENDED_RELEASE_TABLET | Freq: Every morning | ORAL | Status: DC

## 2016-03-12 NOTE — Telephone Encounter (Signed)
Pt was unaware that he needed to schedule an office visit the last time he was in. Pt thought that we would be sending him a reminder for an appt. Pt has scheduled an appt for the middle of May and is going to be out of his medications before then. Pt is already out of most of them. Pt is needing refills on his: atorvastatin (LIPITOR) 20 MG tablet, hydrochlorothiazide (HYDRODIURIL) 25 MG tablet, lisinopril (PRINIVIL,ZESTRIL) 5 MG tablet, meloxicam (MOBIC) 15 MG tablet, potassium chloride SA (K-DUR,KLOR-CON) 20 MEQ tablet today due to the fact that he is out of them now. Pt will also need a refill on his  allopurinol (ZYLOPRIM) 300 MG tablet.     [place]ave

## 2016-03-12 NOTE — Telephone Encounter (Signed)
Notified patient that 30 day supply was sent to pharmacy. Keep appointment.

## 2016-03-15 ENCOUNTER — Ambulatory Visit (HOSPITAL_COMMUNITY): Payer: 59 | Attending: Family Medicine | Admitting: Physical Therapy

## 2016-03-15 DIAGNOSIS — M545 Low back pain, unspecified: Secondary | ICD-10-CM

## 2016-03-15 DIAGNOSIS — R2689 Other abnormalities of gait and mobility: Secondary | ICD-10-CM | POA: Diagnosis present

## 2016-03-15 DIAGNOSIS — R29898 Other symptoms and signs involving the musculoskeletal system: Secondary | ICD-10-CM | POA: Diagnosis present

## 2016-03-15 DIAGNOSIS — R269 Unspecified abnormalities of gait and mobility: Secondary | ICD-10-CM | POA: Diagnosis present

## 2016-03-15 DIAGNOSIS — M6281 Muscle weakness (generalized): Secondary | ICD-10-CM | POA: Diagnosis present

## 2016-03-15 DIAGNOSIS — R6889 Other general symptoms and signs: Secondary | ICD-10-CM | POA: Insufficient documentation

## 2016-03-15 DIAGNOSIS — M623 Immobility syndrome (paraplegic): Secondary | ICD-10-CM | POA: Insufficient documentation

## 2016-03-15 NOTE — Therapy (Signed)
Manuel Torres, [place], [address] Phone: [phone]   Fax:  [phone]  Physical Therapy Treatment  Patient Details  Name: Manuel Torres MRN: [id number] Date of Birth: [birth year] Referring Provider: Lorayne Marek  Encounter Date: 03/15/2016      PT End of Session - 03/15/16 1430    Visit Number 17   Number of Visits 18   Date for PT Re-Evaluation 03/21/16   Authorization Type united [employer]    Authorization - Visit Number 17   Authorization - Number of Visits 18   PT Start Time [id number]   PT Stop Time 1430   PT Time Calculation (min) 43 min   Activity Tolerance Patient tolerated treatment well   Behavior During Therapy [facility] for tasks assessed/performed      Past Medical History  Diagnosis Date  . Hypertension   . Hypercholesteremia   . Gout     NO RECENT FLARE UPS  . Arthritis     Hip  . Gout     Past Surgical History  Procedure Laterality Date  . Right inguinal hernia    . Colonoscopy  08/03/2012    Procedure: COLONOSCOPY;  Surgeon: Rogene Houston, MD;  [facility];  Service: Endoscopy;  Laterality: N/A;  830  . Total hip arthroplasty  10/23/2012    Procedure: TOTAL HIP ARTHROPLASTY;  Surgeon: Gearlean Alf, MD;  Location: [facility];  Service: Orthopedics;  Laterality: Right;  . Joint replacement      r hip Dr. Maureen Ralphs Dec 2013    There were no vitals filed for this visit.      Subjective Assessment - 03/15/16 1350    Subjective Pt states he is doing good now that he has been up and moving around. He has been doing his exercises at home and they are getting easier. Reports that he is using his SPC less recently, ~25% of the time.   Pertinent History Rt THR 2013, HTN,    Patient Stated Goals Pt's goals include reducing his LBP/L hip pain and ambulate without an AD.    Currently in Pain? Yes   Pain Score 2    Pain Location --  Low back, Lt hip   Pain Orientation Lower                          [facility] Adult PT Treatment/Exercise - 03/15/16 0001    Lumbar Exercises: Supine   Ab Set 20 reps  alt knee lowering x2 sets   Lumbar Exercises: Quadruped   Madcat/Old Horse 20 reps   Madcat/Old Horse Limitations limited lumbopelvic movement noted   Single Arm Raise Right;Left;15 reps   Single Arm Raises Limitations increased trunk rotation with LUE reach.    Straight Leg Raise 10 reps   Manual Therapy   Manual Therapy Joint mobilization;Soft tissue mobilization   Manual therapy comments performed separately from all other interventions   Joint Mobilization Grade III-IV PAs to lumbar spine in prone   Soft tissue mobilization Lt TrP release                 PT Education - 03/15/16 1429    Education provided Yes   Education Details discussed reassessment next visit; discussed implications for manual treatment.    Person(s) Educated Patient   Methods Explanation;Demonstration   Comprehension Verbalized understanding;Returned demonstration          PT Short Term Goals -  01/23/16 1428    PT SHORT TERM GOAL #1   Title Pt will improve trunk/hip  ROM to be able to pick an item off the ground    Time 4   Period Weeks   Status On-going   PT SHORT TERM GOAL #2   Title Pt will improve strength by one grade to allow pt to feel confident walking with a cane    Time 4   Period Weeks   Status On-going   PT SHORT TERM GOAL #3   Title Pt will report a 50% decrease in pain to allow improved sleeping    Time 4   Period Weeks   Status On-going   PT SHORT TERM GOAL #4   Title Pt will be independent in HEP to allow the above to ocur in a timely manner.    Time 4   Period Weeks   Status On-going           PT Long Term Goals - 01/23/16 1429    PT LONG TERM GOAL #1   Title Pt strength will be improved to 5/5 to allow him to walk without an assistive device   Time 8   Period Weeks   Status On-going   PT LONG TERM GOAL #2   Title PT to  be able to demonstrate good body mechanics while lifting for protecion of his low back    Time 8   Status On-going   PT LONG TERM GOAL #3   Title Pt pain level to decrease to no greater than a 3/10 80% of the day to be able to return to work    Time 8   Status On-going   PT LONG TERM GOAL #4   Title PT to be able to come sit to stand without the use of his Upper extremities   Time 8   Period Weeks   Status On-going   PT LONG TERM GOAL #5   Title Pt to be able to walk 1000 ft without assistive device in six minutes to be able to ambulate in the community with ease    Time 8   Period Weeks   Status On-going   Additional Long Term Goals   Additional Long Term Goals Yes   PT LONG TERM GOAL #6   Title Pt to be able to go up and down two flights of steps with no increased pain to be able to complete work activities    Time 8   Period Weeks   Status New               Plan - 03/15/16 1608    Clinical Impression Statement Today's session focused on manual treatment and trunk stability therex to improve overall mobility and pain. Pt with trigger points throughout his Lt piriformis and reporting pain relief after manual treatment. Noting pt with decreased lumbopelvic mobility during quad activity as well as increased difficulty with UE/LE reach secondary to poor muscle endurance. Pt to be reassessed next session.   Rehab Potential Good   PT Frequency 2x / week   PT Duration 4 weeks   PT Treatment/Interventions Electrical Stimulation;Cryotherapy;Gait training;Stair training;Functional mobility training;Therapeutic activities;Therapeutic exercise;Balance training;Patient/family education;Manual techniques;Moist Heat;Traction;Ultrasound   PT Next Visit Plan reassess   PT Home Exercise Plan no updates this session   Consulted and Agree with Plan of Care Patient      Patient will benefit from skilled therapeutic intervention in order to improve the following deficits and impairments:  Abnormal gait, Decreased activity tolerance, Difficulty walking, Decreased balance, Pain, Decreased strength, Decreased range of motion  Visit Diagnosis: Bilateral low back pain without sciatica  Other abnormalities of gait and mobility  Muscle weakness (generalized)     Problem List Patient Active Problem List   Diagnosis Date Noted  . Morbid obesity ([place]) 01/24/2015  . Metabolic syndrome [id number]  . Essential hypertension, benign 04/19/2013  . Hyperlipemia 04/19/2013  . Prediabetes 04/19/2013  . Gout 04/19/2013  . Elevated transaminase level 04/19/2013  . Difficulty in walking(719.7) 11/14/2012  . Weakness of right leg 11/14/2012  . Postop Hyponatremia 10/24/2012  . OA (osteoarthritis) of hip 10/23/2012  . NONSPEC ELEVATION OF LEVELS OF TRANSAMINASE/LDH 05/20/2010   4:12 PM,03/15/2016 Elly Modena PT, DPT [facility] [address] [place], [place], [address] Phone: [phone]   Fax:  [phone]  Name: NIAM CHASTEEN MRN: [id number] Date of Birth: 11/21/1960

## 2016-03-18 ENCOUNTER — Ambulatory Visit (HOSPITAL_COMMUNITY): Payer: 59 | Admitting: Physical Therapy

## 2016-03-19 ENCOUNTER — Ambulatory Visit (HOSPITAL_COMMUNITY): Payer: 59

## 2016-03-19 DIAGNOSIS — R2689 Other abnormalities of gait and mobility: Secondary | ICD-10-CM

## 2016-03-19 DIAGNOSIS — R6889 Other general symptoms and signs: Secondary | ICD-10-CM

## 2016-03-19 DIAGNOSIS — M545 Low back pain, unspecified: Secondary | ICD-10-CM

## 2016-03-19 DIAGNOSIS — R29898 Other symptoms and signs involving the musculoskeletal system: Secondary | ICD-10-CM

## 2016-03-19 DIAGNOSIS — M2560 Stiffness of unspecified joint, not elsewhere classified: Secondary | ICD-10-CM

## 2016-03-19 DIAGNOSIS — Z7409 Other reduced mobility: Secondary | ICD-10-CM

## 2016-03-19 DIAGNOSIS — M6281 Muscle weakness (generalized): Secondary | ICD-10-CM

## 2016-03-19 DIAGNOSIS — R269 Unspecified abnormalities of gait and mobility: Secondary | ICD-10-CM

## 2016-03-19 NOTE — Therapy (Signed)
[place] Torres, [place], [address] Phone: [phone]   Fax:  [phone]  Physical Therapy Re-Evaluation  Patient Details  Name: Manuel Torres MRN: [id number] Date of Birth: 1961-10-30 Referring Provider: Lorayne Torres  Encounter Date: 03/19/2016      PT End of Session - 03/19/16 1636    Visit Number 18   Number of Visits 18   Date for PT Re-Evaluation 03/21/16   Authorization Type united healthcare    Authorization - Visit Number 18   Authorization - Number of Visits 18   PT Start Time 2542   PT Stop Time 1428   PT Time Calculation (min) 73 min   Activity Tolerance Patient tolerated treatment well;Patient limited by fatigue;Patient limited by pain   Behavior During Therapy [facility] for tasks assessed/performed      Past Medical History  Diagnosis Date  . Hypertension   . Hypercholesteremia   . Gout     NO RECENT FLARE UPS  . Arthritis     Hip  . Gout     Past Surgical History  Procedure Laterality Date  . Right inguinal hernia    . Colonoscopy  08/03/2012    Procedure: COLONOSCOPY;  Surgeon: Manuel Houston, MD;  [facility];  Service: Endoscopy;  Laterality: N/A;  830  . Total hip arthroplasty  10/23/2012    Procedure: TOTAL HIP ARTHROPLASTY;  Surgeon: Manuel Alf, MD;  [facility];  Service: Orthopedics;  Laterality: Right;  . Joint replacement      r hip Dr. Maureen Torres Dec 2013    There were no vitals filed for this visit.       Subjective Assessment - 03/19/16 1319    Subjective No major complaints today, pain remains consistent, but flutuating throughout day. HEP is going fine.    Pertinent History Rt THR 2013, HTN,    Limitations Standing;Walking   How long can you sit comfortably? able to sit for two hours, largely unchanged since 3WA   How long can you stand comfortably? 15 minutes with weight shifting, leaning etc, performing a task.    How long can you walk comfortably?  30 minutes with RW as neededl; shorter distance are performed without AD, but then uses the [facility] after 10-15 min.    Diagnostic tests MRI:  spinal stenosis    Patient Stated Goals Pt's goals include reducing his LBP/L hip pain and ambulate without an AD.             [facility] PT Assessment - 03/19/16 0001    Assessment   Medical Diagnosis Spinal stenosis/Lt bursitis    Referring Provider Manuel Torres   Onset Date/Surgical Date 11/17/15   Next MD Visit May 17th, 19th with Primary    Prior Therapy none   Precautions   Precautions Fall   Restrictions   Weight Bearing Restrictions No   Balance Screen   Has the patient fallen in the past 6 months No   How many times? 0   Has the patient had a decrease in activity level because of a fear of falling?  No   Is the patient reluctant to leave their home because of a fear of falling?  No   Home Environment   Living Environment Private residence   Type of [place] to enter   Entrance Stairs-Number of Steps 1   [place] to live on main level with bedroom/bathroom  Prior Function   Vocation Full time employment;On disability  Short Term Disability since Dec 2016   Vocation Requirements on feet all the time lifting up to 100 feet with assist    Leisure walking, golfing    Observation/Other Assessments   Focus on Therapeutic Outcomes (FOTO)  39  Done on 4/7   Strength   Right Hip Flexion 5/5   Right Hip Extension 4+/5   Right Hip ABduction 5/5   Left Hip Flexion 5/5   Left Hip Extension 4/5   Left Hip ABduction 4+/5   Right Knee Flexion 5/5   Right Knee Extension 5/5   Left Knee Flexion 5/5   Left Knee Extension 5/5   Right Ankle Dorsiflexion 5/5   Left Ankle Dorsiflexion 5/5   6 Minute Walk- Baseline   6 Minute Walk- Baseline yes  959f in 5 min walk test for conssitency        Right hip IR Rotation: 5/5, pain free Right hip ER Rotation: 5/5 pain free Left hip IR Rotation: 5/5 pain free  Left  hip ER Rotation: 5/5 pain free ROM:  Left Hip Abd: 12 degrees Left hip Flexion: 85 degrees Left hip ER in neutral: 5 degrees Left hip IR in neutral: 5 degrees  Special Tests:  R Thigh Thrust: (+) for shooting trochanteric burning pain R Grind Test: (+) for shooting trochanteric burning pain R External derotation Test: (-) for glute med tendinopathy/tendon tear         PT Short Term Goals - 03/19/16 1345    PT SHORT TERM GOAL #1   Title Pt will improve trunk/hip  ROM to be able to pick an item off the ground    Status Partially Met  easier, but still difficult with bending.    PT SHORT TERM GOAL #2   Title Pt will improve strength by one grade to allow pt to feel confident walking with a cane    Status Achieved   PT SHORT TERM GOAL #3   Title Pt will report a 50% decrease in pain to allow improved sleeping    Status Achieved   PT SHORT TERM GOAL #4   Title Pt will be independent in HEP to allow the above to ocur in a timely manner.    Status Achieved           PT Long Term Goals - 03/19/16 1347    PT LONG TERM GOAL #1   Title Pt strength will be improved to 5/5 to allow him to walk without an assistive device  able to perform with improved strength, but pain rather  than weakness.   Status Partially Met   PT LONG TERM GOAL #2   Title PT to be able to demonstrate good body mechanics while lifting for protecion of his low back   Pt reports has not been done in the clinic.    Time 8   Period Weeks   Status On-going   PT LONG TERM GOAL #3   Title Pt pain level to decrease to no greater than a 3/10 80% of the day to be able to return to work    Time 8   Period Weeks   Status Achieved   PT LONG TERM GOAL #4   Title PT to be able to come sit to stand without the use of his Upper extremities  modified indep   Time 8   Period Weeks   Status Achieved   PT LONG TERM GOAL #5  Title Pt to be able to walk 1000 ft without assistive device in six minutes to be able to  ambulate in the community with ease    Baseline cutoff at 5 minutes as other two tests were that far.    Status Partially Met   PT LONG TERM GOAL #6   Title Pt to be able to go up and down two flights of steps with no increased pain to be able to complete work activities   able to perform safely, but left hip valgus/medical collapse is pronounced , L lateral hip pain is worsening.    Time 8   Status Partially Met               Plan - 03/19/16 1623    Clinical Impression Statement Pt full re-assessed today. Pt demonstrating completion of 2 of 6 long term goals, and progress made toward 3 of the remaining four. The patient has demonstrated excellent improvement in strength, mild progress in activity tolerance and indep mobilty, and moderate progress in pain control. the patient will continue to benefit from skilled PT to continue progress toward all DC goals and prepare for return to work, with more specific high level activity training for job specific tasks. Pt cotntinues to present with constant BLE radicular paiin, which fluctuates in intensity throughout each day. C/O pain in Left hip is more concerning, wherein more close examination of L hip reveals substantial loss of joint mobility in a capsular pattern, trochanteric pain that increases with walking, and worsens abrubtly with thigh trust and/or grind test, all strong prognostic indicators of late stage hip OA. The patient reports that his surgeon recommended L THA at the time when the R hip was being evaluated 3YA. I am pleased to see the progress that the patient has made thus far, however additional work is needed to propare for return to work and return to [employer] and improve qulaity of life. I am concerned about the current status of his Left hip and recommend additional FU from  an ortho, as I am concerned it may severely limit what additional progress he is able to make.    Rehab Potential Fair   PT Frequency 2x / week   PT Duration 4  weeks   PT Treatment/Interventions Electrical Stimulation;Cryotherapy;Gait training;Stair training;Functional mobility training;Therapeutic activities;Therapeutic exercise;Balance training;Patient/family education;Manual techniques;Moist Heat;Traction;Ultrasound   PT Next Visit Plan Review and update HEP in full.    Consulted and Agree with Plan of Care Patient      Patient will benefit from skilled therapeutic intervention in order to improve the following deficits and impairments:  Abnormal gait, Decreased activity tolerance, Difficulty walking, Decreased balance, Pain, Decreased strength, Decreased range of motion  Visit Diagnosis: Bilateral low back pain without sciatica - Plan: PT plan of care cert/re-cert  Other abnormalities of gait and mobility - Plan: PT plan of care cert/re-cert  Muscle weakness (generalized) - Plan: PT plan of care cert/re-cert  Low back pain potentially associated with radiculopathy - Plan: PT plan of care cert/re-cert  Stiffness due to immobility - Plan: PT plan of care cert/re-cert  Abnormality of gait - Plan: PT plan of care cert/re-cert  Activity intolerance - Plan: PT plan of care cert/re-cert  Weakness of both legs - Plan: PT plan of care cert/re-cert     Problem List Patient Active Problem List   Diagnosis Date Noted  . Morbid obesity ([place]) 01/24/2015  . Metabolic syndrome 72/53/6644  . Essential hypertension, benign 04/19/2013  . Hyperlipemia  04/19/2013  . Prediabetes 04/19/2013  . Gout 04/19/2013  . Elevated transaminase level 04/19/2013  . Difficulty in walking(719.7) 11/14/2012  . Weakness of right leg 11/14/2012  . Postop Hyponatremia 10/24/2012  . OA (osteoarthritis) of hip 10/23/2012  . NONSPEC ELEVATION OF LEVELS OF TRANSAMINASE/LDH 05/20/2010    4:53 PM, 03/19/2016 Etta Grandchild, PT, DPT PRN Physical Therapist - [place] License # [id number] [phone] (mobile)   [facility] [address] [place], [place], [address] Phone: [phone]   Fax:  [phone]  Name: ROLFE HARTSELL MRN: [id number] Date of Birth: [birth year]

## 2016-03-21 ENCOUNTER — Other Ambulatory Visit: Payer: Self-pay | Admitting: Family Medicine

## 2016-03-22 NOTE — Telephone Encounter (Signed)
Last seen for check up March 2016. Has had refills and advised office visit. Please advise.

## 2016-03-22 NOTE — Telephone Encounter (Signed)
1 prescription on each medicine needs office visit letter will be sent

## 2016-03-24 ENCOUNTER — Ambulatory Visit (HOSPITAL_COMMUNITY): Payer: 59 | Admitting: Physical Therapy

## 2016-03-24 DIAGNOSIS — R2689 Other abnormalities of gait and mobility: Secondary | ICD-10-CM

## 2016-03-24 DIAGNOSIS — M545 Low back pain, unspecified: Secondary | ICD-10-CM

## 2016-03-24 DIAGNOSIS — M6281 Muscle weakness (generalized): Secondary | ICD-10-CM

## 2016-03-24 NOTE — Therapy (Signed)
[place], [place], [address] Phone: [phone]   Fax:  [phone]  Physical Therapy Treatment  Patient Details  Name: Manuel Torres MRN: [id number] Date of Birth: 23-Feb-1961 Referring Provider: Lorayne Marek  Encounter Date: 03/24/2016      PT End of Session - 03/24/16 1208    Visit Number 19   Number of Visits 26   Date for PT Re-Evaluation 04/18/16   Authorization Type united healthcare    Authorization - Visit Number 19   Authorization - Number of Visits 26   PT Start Time 1122   PT Stop Time 1206   PT Time Calculation (min) 44 min   Activity Tolerance Patient tolerated treatment well   Behavior During Therapy [facility] for tasks assessed/performed      Past Medical History  Diagnosis Date  . Hypertension   . Hypercholesteremia   . Gout     NO RECENT FLARE UPS  . Arthritis     Hip  . Gout     Past Surgical History  Procedure Laterality Date  . Right inguinal hernia    . Colonoscopy  08/03/2012    Procedure: COLONOSCOPY;  Surgeon: Rogene Houston, MD;  [facility];  Service: Endoscopy;  Laterality: N/A;  830  . Total hip arthroplasty  10/23/2012    Procedure: TOTAL HIP ARTHROPLASTY;  Surgeon: Gearlean Alf, MD;  [facility];  Service: Orthopedics;  Laterality: Right;  . Joint replacement      r hip Dr. Maureen Ralphs Dec 2013    There were no vitals filed for this visit.      Subjective Assessment - 03/24/16 1126    Subjective Pt states he woke up stiff and sore today especiallly in the [facility].  He took some pain medication which has improved the pain.    Pertinent History Rt THR 2013, HTN,    Currently in Pain? Yes   Pain Score 4    Pain Location Hip   Pain Orientation Left   Pain Descriptors / Indicators Burning   Pain Type Chronic pain   Pain Radiating Towards Just at the bursa    Pain Frequency Constant   Aggravating Factors  walking and standing    Pain Relieving  Factors medication    Pain Score 4   Pain Location Back   Pain Orientation Lower   Pain Descriptors / Indicators Aching;Burning   Pain Type Chronic pain   Pain Radiating Towards To knee on Lt and ankle on Rt.    Pain Onset More than a month ago   Pain Frequency Constant                         [facility] Adult PT Treatment/Exercise - 03/24/16 0001    Lumbar Exercises: Stretches   Single Knee to Chest Stretch 3 reps;20 seconds   Single Knee to Chest Stretch Limitations passive on Lt only    Prone on Elbows Stretch 5 reps   Press Ups 3 reps   Piriformis Stretch 3 reps;20 seconds   Piriformis Stretch Limitations Passive on left only    Lumbar Exercises: Standing   Heel Raises 10 reps   Functional Squats 15 reps   Lifting 5 reps   Lifting Weights (lbs) from 18"    Other Standing Lumbar Exercises 3-D hip excursion    Lumbar Exercises: Supine   Large Ball Abdominal Isometric Limitations 10   Large  Ball Oblique Isometric Limitations 10   Lumbar Exercises: Prone   Straight Leg Raise 10 reps   Lumbar Exercises: Quadruped   Madcat/Old Horse 10 reps   Straight Leg Raise 10 reps                PT Education - 03/24/16 1206    Education provided Yes   Education Details Proper lifting techniques; Prone single leg raise, Lg ball oblique and lower abdominal exercises    Person(s) Educated Patient   Methods Explanation   Comprehension Verbalized understanding;Returned demonstration          PT Short Term Goals - 03/19/16 1345    PT SHORT TERM GOAL #1   Title Pt will improve trunk/hip  ROM to be able to pick an item off the ground    Status Partially Met  easier, but still difficult with bending.    PT SHORT TERM GOAL #2   Title Pt will improve strength by one grade to allow pt to feel confident walking with a cane    Status Achieved   PT SHORT TERM GOAL #3   Title Pt will report a 50% decrease in pain to allow improved sleeping    Status Achieved   PT SHORT  TERM GOAL #4   Title Pt will be independent in HEP to allow the above to ocur in a timely manner.    Status Achieved           PT Long Term Goals - 03/19/16 1347    PT LONG TERM GOAL #1   Title Pt strength will be improved to 5/5 to allow him to walk without an assistive device  able to perform with improved strength, but pain rather  than weakness.   Status Partially Met   PT LONG TERM GOAL #2   Title PT to be able to demonstrate good body mechanics while lifting for protecion of his low back   Pt reports has not been done in the clinic.    Time 8   Period Weeks   Status On-going   PT LONG TERM GOAL #3   Title Pt pain level to decrease to no greater than a 3/10 80% of the day to be able to return to work    Time 8   Period Weeks   Status Achieved   PT LONG TERM GOAL #4   Title PT to be able to come sit to stand without the use of his Upper extremities  modified indep   Time 8   Period Weeks   Status Achieved   PT LONG TERM GOAL #5   Title Pt to be able to walk 1000 ft without assistive device in six minutes to be able to ambulate in the community with ease    Baseline cutoff at 5 minutes as other two tests were that far.    Status Partially Met   PT LONG TERM GOAL #6   Title Pt to be able to go up and down two flights of steps with no increased pain to be able to complete work activities   able to perform safely, but left hip valgus/medical collapse is pronounced , L lateral hip pain is worsening.    Time 8   Status Partially Met               Plan - 03/24/16 1209    Clinical Impression Statement Pt treatment focused on improving strength and ROM.  Pt able to complete new exercise  with verbal cuing for technique.  Began lifting with pt unable to perform lifting from 12" from floor due to weakness in Lt LE.  Box raised to 18"; pt able to lift box at thsi height but noted fatigue of Lt LE>   Pt discussed whether traction improved symptoms. Pt states first tration  seemed to improve but second traction caused irritation.     Rehab Potential Fair   PT Duration --  for a total of 12 weeks    PT Treatment/Interventions Electrical Stimulation;Cryotherapy;Gait training;Stair training;Functional mobility training;Therapeutic activities;Therapeutic exercise;Balance training;Patient/family education;Manual techniques;Moist Heat;Traction;Ultrasound   PT Next Visit Plan continue to work on functional strengthening add lunges.  Give quadriped exercise to pt for HEP        Patient will benefit from skilled therapeutic intervention in order to improve the following deficits and impairments:  Abnormal gait, Decreased activity tolerance, Difficulty walking, Decreased balance, Pain, Decreased strength, Decreased range of motion  Visit Diagnosis: Bilateral low back pain without sciatica  Other abnormalities of gait and mobility  Muscle weakness (generalized)     Problem List Patient Active Problem List   Diagnosis Date Noted  . Morbid obesity ([place]) 01/24/2015  . Metabolic syndrome 03/16/5614  . Essential hypertension, benign 04/19/2013  . Hyperlipemia 04/19/2013  . Prediabetes 04/19/2013  . Gout 04/19/2013  . Elevated transaminase level 04/19/2013  . Difficulty in walking(719.7) 11/14/2012  . Weakness of right leg 11/14/2012  . Postop Hyponatremia 10/24/2012  . OA (osteoarthritis) of hip 10/23/2012  . NONSPEC ELEVATION OF LEVELS OF TRANSAMINASE/LDH 05/20/2010  Rayetta Humphrey, PT CLT [phone] 03/24/2016, 12:16 PM  [place] [address] [place], [place], [address] Phone: [phone]   Fax:  [phone]  Name: Manuel Torres MRN: [id number] Date of Birth: 20-Jun-1961

## 2016-03-26 ENCOUNTER — Ambulatory Visit (HOSPITAL_COMMUNITY): Payer: 59

## 2016-03-26 DIAGNOSIS — M545 Low back pain, unspecified: Secondary | ICD-10-CM

## 2016-03-26 DIAGNOSIS — R6889 Other general symptoms and signs: Secondary | ICD-10-CM

## 2016-03-26 DIAGNOSIS — R269 Unspecified abnormalities of gait and mobility: Secondary | ICD-10-CM

## 2016-03-26 DIAGNOSIS — M6281 Muscle weakness (generalized): Secondary | ICD-10-CM

## 2016-03-26 DIAGNOSIS — R2689 Other abnormalities of gait and mobility: Secondary | ICD-10-CM

## 2016-03-26 DIAGNOSIS — Z7409 Other reduced mobility: Secondary | ICD-10-CM

## 2016-03-26 DIAGNOSIS — R29898 Other symptoms and signs involving the musculoskeletal system: Secondary | ICD-10-CM

## 2016-03-26 DIAGNOSIS — M2560 Stiffness of unspecified joint, not elsewhere classified: Secondary | ICD-10-CM

## 2016-03-26 NOTE — Therapy (Signed)
[place], [place], [address] Phone: [phone]   Fax:  [phone]  Physical Therapy Treatment  Patient Details  Name: Manuel Torres MRN: [id number] Date of Birth: 05/14/1961 Referring Provider: Lorayne Marek  Encounter Date: 03/26/2016      PT End of Session - 03/26/16 1209    Visit Number 20   Number of Visits 26   Date for PT Re-Evaluation 04/18/16   Authorization Type united healthcare    Authorization - Visit Number 20   Authorization - Number of Visits 26   PT Start Time 1120   PT Stop Time 1200   PT Time Calculation (min) 40 min   Equipment Utilized During Treatment Other (comment)  mobilization belt.    Activity Tolerance Patient tolerated treatment well   Behavior During Therapy [facility] for tasks assessed/performed      Past Medical History  Diagnosis Date  . Hypertension   . Hypercholesteremia   . Gout     NO RECENT FLARE UPS  . Arthritis     Hip  . Gout     Past Surgical History  Procedure Laterality Date  . Right inguinal hernia    . Colonoscopy  08/03/2012    Procedure: COLONOSCOPY;  Surgeon: Rogene Houston, MD;  [facility];  Service: Endoscopy;  Laterality: N/A;  830  . Total hip arthroplasty  10/23/2012    Procedure: TOTAL HIP ARTHROPLASTY;  Surgeon: Gearlean Alf, MD;  Location: [facility];  Service: Orthopedics;  Laterality: Right;  . Joint replacement      r hip Dr. Maureen Ralphs Dec 2013    There were no vitals filed for this visit.      Subjective Assessment - 03/26/16 1124    Subjective Pt reports all is going well. He performed the new figure four stretch at home, which he says is improving his mobility and pain in Left hip.    Pertinent History Rt THR 2013, HTN,    Currently in Pain? Yes   Pain Score 4   4 in left hip, 5 in low back, bilat legs burning at 3/10. All are mildly imporved since waking this morning.                          [place]  Adult PT Treatment/Exercise - 03/26/16 0001    Manual Therapy   Manual therapy comments Left anterior cutaneous femoral nerve glides/scitatic nerve glides with MFR at lower R abs   Joint Mobilization L hip lat glide 2x45s at 0*, 2x45s @ 60 degres  Left hip inferior glide at 90degrees 3x45s   Soft tissue mobilization R external obliques, internal obliques near exit of femoral cutaneous nn/   Manual Traction LLE 10 minutes with Mobilization belt for assistance  (hip pull, longitudinal traction: 15*abduction, 5* flexion,                   PT Short Term Goals - 03/19/16 1345    PT SHORT TERM GOAL #1   Title Pt will improve trunk/hip  ROM to be able to pick an item off the ground    Status Partially Met  easier, but still difficult with bending.    PT SHORT TERM GOAL #2   Title Pt will improve strength by one grade to allow pt to feel confident walking with a cane    Status Achieved   PT SHORT TERM GOAL #3  Title Pt will report a 50% decrease in pain to allow improved sleeping    Status Achieved   PT SHORT TERM GOAL #4   Title Pt will be independent in HEP to allow the above to ocur in a timely manner.    Status Achieved           PT Long Term Goals - 03/19/16 1347    PT LONG TERM GOAL #1   Title Pt strength will be improved to 5/5 to allow him to walk without an assistive device  able to perform with improved strength, but pain rather  than weakness.   Status Partially Met   PT LONG TERM GOAL #2   Title PT to be able to demonstrate good body mechanics while lifting for protecion of his low back   Pt reports has not been done in the clinic.    Time 8   Period Weeks   Status On-going   PT LONG TERM GOAL #3   Title Pt pain level to decrease to no greater than a 3/10 80% of the day to be able to return to work    Time 8   Period Weeks   Status Achieved   PT LONG TERM GOAL #4   Title PT to be able to come sit to stand without the use of his Upper extremities   modified indep   Time 8   Period Weeks   Status Achieved   PT LONG TERM GOAL #5   Title Pt to be able to walk 1000 ft without assistive device in six minutes to be able to ambulate in the community with ease    Baseline cutoff at 5 minutes as other two tests were that far.    Status Partially Met   PT LONG TERM GOAL #6   Title Pt to be able to go up and down two flights of steps with no increased pain to be able to complete work activities   able to perform safely, but left hip valgus/medical collapse is pronounced , L lateral hip pain is worsening.    Time 8   Status Partially Met               Plan - 03/26/16 1210    Clinical Impression Statement Pt responding well to manual therapy today, completely resolving LLE radicular pain, RLE radicular pain, and improving Left hip joint laxity,  although it remains surrounded by very tight muscles, especially in the upper adductors and groin. Pt making some pregress toward goals today, learning more about what types of activities may be benefitital for management of smptoms and pain at home. New HEP stretches are benefitial and pt is progressing well.    Rehab Potential Fair   PT Frequency 2x / week   PT Treatment/Interventions Electrical Stimulation;Cryotherapy;Gait training;Stair training;Functional mobility training;Therapeutic activities;Therapeutic exercise;Balance training;Patient/family education;Manual techniques;Moist Heat;Traction;Ultrasound   PT Next Visit Plan continue with hip glides with belt, and add in more functional strengthening, add lunges.    PT Home Exercise Plan no updates this session   Consulted and Agree with Plan of Care Patient      Patient will benefit from skilled therapeutic intervention in order to improve the following deficits and impairments:  Abnormal gait, Decreased activity tolerance, Difficulty walking, Decreased balance, Pain, Decreased strength, Decreased range of motion  Visit  Diagnosis: Bilateral low back pain without sciatica  Other abnormalities of gait and mobility  Muscle weakness (generalized)  Low back pain potentially associated with  radiculopathy  Stiffness due to immobility  Abnormality of gait  Activity intolerance  Weakness of both legs     Problem List Patient Active Problem List   Diagnosis Date Noted  . Morbid obesity ([place]) 01/24/2015  . Metabolic syndrome 41/28/7867  . Essential hypertension, benign 04/19/2013  . Hyperlipemia 04/19/2013  . Prediabetes 04/19/2013  . Gout 04/19/2013  . Elevated transaminase level 04/19/2013  . Difficulty in walking(719.7) 11/14/2012  . Weakness of right leg 11/14/2012  . Postop Hyponatremia 10/24/2012  . OA (osteoarthritis) of hip 10/23/2012  . NONSPEC ELEVATION OF LEVELS OF TRANSAMINASE/LDH 05/20/2010    12:15 PM, 03/26/2016 Etta Grandchild, PT, DPT PRN Physical Therapist - [place] License # [id number] [phone] (mobile)   [facility] [address] [place], [place], [address] Phone: [phone]   Fax:  [phone]  Name: Manuel Torres MRN: [id number] Date of Birth: 1961-01-12

## 2016-03-30 ENCOUNTER — Ambulatory Visit (HOSPITAL_COMMUNITY): Payer: 59

## 2016-03-30 DIAGNOSIS — M2560 Stiffness of unspecified joint, not elsewhere classified: Secondary | ICD-10-CM

## 2016-03-30 DIAGNOSIS — M545 Low back pain, unspecified: Secondary | ICD-10-CM

## 2016-03-30 DIAGNOSIS — R269 Unspecified abnormalities of gait and mobility: Secondary | ICD-10-CM

## 2016-03-30 DIAGNOSIS — R29898 Other symptoms and signs involving the musculoskeletal system: Secondary | ICD-10-CM

## 2016-03-30 DIAGNOSIS — M6281 Muscle weakness (generalized): Secondary | ICD-10-CM

## 2016-03-30 DIAGNOSIS — Z7409 Other reduced mobility: Secondary | ICD-10-CM

## 2016-03-30 DIAGNOSIS — R6889 Other general symptoms and signs: Secondary | ICD-10-CM

## 2016-03-30 DIAGNOSIS — R2689 Other abnormalities of gait and mobility: Secondary | ICD-10-CM

## 2016-03-30 NOTE — Therapy (Signed)
[place], [place], [address] Phone: [phone]   Fax:  [phone]  Physical Therapy Treatment  Patient Details  Name: Manuel Torres MRN: [id number] Date of Birth: 1961-10-24 Referring Provider: Lorayne Marek  Encounter Date: 03/30/2016      PT End of Session - 03/30/16 1421    Visit Number 21   Number of Visits 26   Date for PT Re-Evaluation 04/18/16   Authorization Type united healthcare    Authorization - Visit Number 21   Authorization - Number of Visits 26   PT Start Time 1302   PT Stop Time 1401   PT Time Calculation (min) 59 min   Equipment Utilized During Treatment --  mobilization belt.    Activity Tolerance Patient tolerated treatment well;Other (comment)  75% resolution of burning pain; 25-50% resolution of LBP.    Behavior During Therapy [facility] for tasks assessed/performed      Past Medical History  Diagnosis Date  . Hypertension   . Hypercholesteremia   . Gout     NO RECENT FLARE UPS  . Arthritis     Hip  . Gout     Past Surgical History  Procedure Laterality Date  . Right inguinal hernia    . Colonoscopy  08/03/2012    Procedure: COLONOSCOPY;  Surgeon: Rogene Houston, MD;  [facility];  Service: Endoscopy;  Laterality: N/A;  830  . Total hip arthroplasty  10/23/2012    Procedure: TOTAL HIP ARTHROPLASTY;  Surgeon: Gearlean Alf, MD;  [facility];  Service: Orthopedics;  Laterality: Right;  . Joint replacement      r hip Dr. Maureen Ralphs Dec 2013    There were no vitals filed for this visit.      Subjective Assessment - 03/30/16 1322    Subjective Pt reports he had a good weekend. He re;orts that the decreased of BLE burning pian remains resolved until about 2d after his last session. He tried to do some walking over the weekend and reports that HEP is going well.    Pertinent History Rt THR 2013, HTN,    Limitations Standing;Walking   How long can you sit  comfortably? able to sit for two hours, largely unchanged since 3WA   How long can you stand comfortably? 15 minutes with weight shifting, leaning etc, performing a task.    How long can you walk comfortably? 30 minutes with RW as neededl; shorter distance are performed without AD, but then uses the [facility] after 10-15 min.    Diagnostic tests MRI:  spinal stenosis    Patient Stated Goals Pt's goals include reducing his LBP/L hip pain and ambulate without an AD.    Currently in Pain? Yes   Pain Score 5    Pain Location --  Low back and L trochanteric    Pain Orientation Left   Pain Descriptors / Indicators Burning;Aching   Pain Type Chronic pain                         [facility] Adult PT Treatment/Exercise - 03/30/16 0001    Lumbar Exercises: Supine   Other Supine Lumbar Exercises Bent knee drop, alt 4x5sets   Lumbar Exercises: Quadruped   Madcat/Old Horse 20 reps  cat only, avoiding 3 excessive lordosis   Manual Therapy   Manual therapy comments Left anterior cutaneous femoral nerve glides/scitatic nerve glides with MFR at lower R abs  20x RLE   Myofascial Release Rt ITB/Lateral Quad, R exteral oblique   Manual Traction LLE hip pull                 PT Education - 03/30/16 1420    Education provided Yes   Education Details Explained how pelvic tilt exercises/awarenss needs to eventually care-over to standing posture and gait.    Person(s) Educated Patient   Methods Explanation;Demonstration   Comprehension Verbalized understanding;Returned demonstration;Need further instruction          PT Short Term Goals - 03/19/16 1345    PT SHORT TERM GOAL #1   Title Pt will improve trunk/hip  ROM to be able to pick an item off the ground    Status Partially Met  easier, but still difficult with bending.    PT SHORT TERM GOAL #2   Title Pt will improve strength by one grade to allow pt to feel confident walking with a cane    Status Achieved   PT SHORT TERM GOAL #3    Title Pt will report a 50% decrease in pain to allow improved sleeping    Status Achieved   PT SHORT TERM GOAL #4   Title Pt will be independent in HEP to allow the above to ocur in a timely manner.    Status Achieved           PT Long Term Goals - 03/19/16 1347    PT LONG TERM GOAL #1   Title Pt strength will be improved to 5/5 to allow him to walk without an assistive device  able to perform with improved strength, but pain rather  than weakness.   Status Partially Met   PT LONG TERM GOAL #2   Title PT to be able to demonstrate good body mechanics while lifting for protecion of his low back   Pt reports has not been done in the clinic.    Time 8   Period Weeks   Status On-going   PT LONG TERM GOAL #3   Title Pt pain level to decrease to no greater than a 3/10 80% of the day to be able to return to work    Time 8   Period Weeks   Status Achieved   PT LONG TERM GOAL #4   Title PT to be able to come sit to stand without the use of his Upper extremities  modified indep   Time 8   Period Weeks   Status Achieved   PT LONG TERM GOAL #5   Title Pt to be able to walk 1000 ft without assistive device in six minutes to be able to ambulate in the community with ease    Baseline cutoff at 5 minutes as other two tests were that far.    Status Partially Met   PT LONG TERM GOAL #6   Title Pt to be able to go up and down two flights of steps with no increased pain to be able to complete work activities   able to perform safely, but left hip valgus/medical collapse is pronounced , L lateral hip pain is worsening.    Time 8   Status Partially Met               Plan - 03/30/16 1423    Clinical Impression Statement SEssion continued to address isolated Left hip mobility with traction and joint glides, as well as soft tissue work to improve R femoral cutaneous nerver glides, and progressing  of abdominal strengthening exercises. For the second session in a row now, the patient  reports resolution of most of his symptoms with prolonged time in hooklying and flexion based posturing/exercise. I have asked th epatient to avoid all extention base therex at this point as large habitus keeps him locked into lordosis, end range extention  during most upright activity, which also limited available hip flexion bilat. Hip mobilization does not seem to provide substantial relief fo pain, but does appear to help minimally with capsular mobility restrictions.  Pt making progress toward goals. I am maintaining recommendation to follow up with orthopedist regarding teh need for additional medical attention for Left hip.    Rehab Potential Good   PT Frequency 2x / week   PT Treatment/Interventions Electrical Stimulation;Cryotherapy;Gait training;Stair training;Functional mobility training;Therapeutic activities;Therapeutic exercise;Balance training;Patient/family education;Manual techniques;Moist Heat;Traction;Ultrasound   PT Next Visit Plan Review HEP given this session.    PT Home Exercise Plan DKTC isometrics 10x5s starting with supine, feet elevated. 4-point camel for pelvis ROM and abdominal activation (no more cow!)    Recommended Other Services Ortho FU for Left Hip    Consulted and Agree with Plan of Care Patient      Patient will benefit from skilled therapeutic intervention in order to improve the following deficits and impairments:  Abnormal gait, Decreased activity tolerance, Difficulty walking, Decreased balance, Pain, Decreased strength, Decreased range of motion  Visit Diagnosis: Bilateral low back pain without sciatica  Other abnormalities of gait and mobility  Muscle weakness (generalized)  Low back pain potentially associated with radiculopathy  Stiffness due to immobility  Abnormality of gait  Activity intolerance  Weakness of both legs     Problem List Patient Active Problem List   Diagnosis Date Noted  . Morbid obesity ([place]) 01/24/2015  . Metabolic  syndrome 99/83/3825  . Essential hypertension, benign 04/19/2013  . Hyperlipemia 04/19/2013  . Prediabetes 04/19/2013  . Gout 04/19/2013  . Elevated transaminase level 04/19/2013  . Difficulty in walking(719.7) 11/14/2012  . Weakness of right leg 11/14/2012  . Postop Hyponatremia 10/24/2012  . OA (osteoarthritis) of hip 10/23/2012  . NONSPEC ELEVATION OF LEVELS OF TRANSAMINASE/LDH 05/20/2010    2:32 PM, 03/30/2016 Etta Grandchild, PT, DPT PRN Physical Therapist at [place] # [id number] [phone] (office)       [place], [place], [address] Phone: [phone]   Fax:  [phone]  Name: Manuel Torres MRN: [id number] Date of Birth: [birth year]

## 2016-03-30 NOTE — Patient Instructions (Signed)
   1. Star on back with feet elevated. Lift both hips/knees into air and hold for 5 seconds. Perform 10x for 5 seconds.   2. On hands and knees perform back rounding, avoid arching now. 10x5 seconds.   3. Avoid low back arching postures as of now.

## 2016-04-01 ENCOUNTER — Telehealth (HOSPITAL_COMMUNITY): Payer: Self-pay | Admitting: Physical Therapy

## 2016-04-01 ENCOUNTER — Ambulatory Visit (HOSPITAL_COMMUNITY): Payer: 59 | Admitting: Physical Therapy

## 2016-04-01 DIAGNOSIS — M545 Low back pain, unspecified: Secondary | ICD-10-CM

## 2016-04-01 DIAGNOSIS — M6281 Muscle weakness (generalized): Secondary | ICD-10-CM

## 2016-04-01 DIAGNOSIS — R2689 Other abnormalities of gait and mobility: Secondary | ICD-10-CM

## 2016-04-01 NOTE — Therapy (Signed)
[facility] [address] [place], [place], [address] Phone: [phone]   Fax:  [phone]  Physical Therapy Treatment  Patient Details  Name: Manuel Torres MRN: [id number] Date of Birth: 10/02/1961 Referring Provider: Bahari Brooks  Encounter Date: 04/01/2016      PT End of Session - 04/01/16 1815    Visit Number 22   Number of Visits 26   Date for PT Re-Evaluation 04/18/16   Authorization Type united healthcare    Authorization - Visit Number 22   Authorization - Number of Visits 26   PT Start Time 1731   PT Stop Time 1811   PT Time Calculation (min) 40 min   Equipment Utilized During Treatment --  mobilization belt.    Activity Tolerance Patient tolerated treatment well      Behavior During Therapy [facility] for tasks assessed/performed      Past Medical History  Diagnosis Date  . Hypertension   . Hypercholesteremia   . Gout     NO RECENT FLARE UPS  . Arthritis     Hip  . Gout     Past Surgical History  Procedure Laterality Date  . Right inguinal hernia    . Colonoscopy  08/03/2012    Procedure: COLONOSCOPY;  Surgeon: Najeeb U Rehman, MD;  [facility];  Service: Endoscopy;  Laterality: N/A;  830  . Total hip arthroplasty  10/23/2012    Procedure: TOTAL HIP ARTHROPLASTY;  Surgeon: Frank V Aluisio, MD;  [facility];  Service: Orthopedics;  Laterality: Right;  . Joint replacement      r hip Dr. Alusio Dec 2013    There were no vitals filed for this visit.      Subjective Assessment - 04/01/16 1733    Subjective Pt states he went to the pain specialist yesterday who is referring him to another neurosurgeon in [place] for a second opinion. He feels he is getting stronger and was able to keep moving with less frequent burning and pain. He is currently having 3/10 pain in back and leg.   Pertinent History Rt THR 2013, HTN,    Patient Stated Goals Pt's goals include reducing his LBP/L hip pain and  ambulate without an AD.    Currently in Pain? Yes   Pain Score 3    Pain Location --  Low back and Lt trochanter   Pain Orientation Left   Pain Descriptors / Indicators Burning;Aching   Pain Type Chronic pain   Pain Frequency Constant   Aggravating Factors  prolonged standing and walking    Pain Relieving Factors medication    Effect of Pain on Daily Activities limited distances/activity   Multiple Pain Sites No                         [facility] Adult PT Treatment/Exercise - 04/01/16 0001    Exercises   Other Exercises  Rt QL stretch in sidelying.   Lumbar Exercises: Stretches   Piriformis Stretch 3 reps;30 seconds   Piriformis Stretch Limitations supine with towel   Lumbar Exercises: Supine   Bent Knee Raise 20 reps   Other Supine Lumbar Exercises alternating bent knee rotation x10 reps.   Lumbar Exercises: Quadruped   Madcat/Old Horse 20 reps  no camel   Single Arm Raise 10 reps   Single Arm Raises Limitations bar across back  weakness noted with RLE weight bearing.   Manual Therapy   Myofascial   Release Lt ITB/quad/QL                PT Education - 04/01/16 1815    Education provided Yes   Education Details updated HEP   Person(s) Educated Patient   Methods Explanation;Demonstration   Comprehension Verbalized understanding;Returned demonstration          PT Short Term Goals - 03/19/16 1345    PT SHORT TERM GOAL #1   Title Pt will improve trunk/hip  ROM to be able to pick an item off the ground    Status Partially Met  easier, but still difficult with bending.    PT SHORT TERM GOAL #2   Title Pt will improve strength by one grade to allow pt to feel confident walking with a cane    Status Achieved   PT SHORT TERM GOAL #3   Title Pt will report a 50% decrease in pain to allow improved sleeping    Status Achieved   PT SHORT TERM GOAL #4   Title Pt will be independent in HEP to allow the above to ocur in a timely manner.    Status Achieved            PT Long Term Goals - 03/19/16 1347    PT LONG TERM GOAL #1   Title Pt strength will be improved to 5/5 to allow him to walk without an assistive device  able to perform with improved strength, but pain rather  than weakness.   Status Partially Met   PT LONG TERM GOAL #2   Title PT to be able to demonstrate good body mechanics while lifting for protecion of his low back   Pt reports has not been done in the clinic.    Time 8   Period Weeks   Status On-going   PT LONG TERM GOAL #3   Title Pt pain level to decrease to no greater than a 3/10 80% of the day to be able to return to work    Time 8   Period Weeks   Status Achieved   PT LONG TERM GOAL #4   Title PT to be able to come sit to stand without the use of his Upper extremities  modified indep   Time 8   Period Weeks   Status Achieved   PT LONG TERM GOAL #5   Title Pt to be able to walk 1000 ft without assistive device in six minutes to be able to ambulate in the community with ease    Baseline cutoff at 5 minutes as other two tests were that far.    Status Partially Met   PT LONG TERM GOAL #6   Title Pt to be able to go up and down two flights of steps with no increased pain to be able to complete work activities   able to perform safely, but left hip valgus/medical collapse is pronounced , L lateral hip pain is worsening.    Time 8   Status Partially Met               Plan - 04/01/16 1816    Clinical Impression Statement Today's session focused on progressions of core stability therex as well as STM to improve relaxation of the Lt ITB/quad/QL. Pt reporting improved pain by the end of today's session. He continues to demonstrate limited pelvic control/mobility as well as limited hip flexibility which should continue to improve with HEP adherence at home. Will continue current POC.  Rehab Potential Good   PT Frequency 2x / week   PT Treatment/Interventions Electrical Stimulation;Cryotherapy;Gait  training;Stair training;Functional mobility training;Therapeutic activities;Therapeutic exercise;Balance training;Patient/family education;Manual techniques;Moist Heat;Traction;Ultrasound   PT Next Visit Plan updated HEP with sidelying QL stretch   PT Home Exercise Plan DKTC isometrics 10x5s starting with supine, feet elevated. 4-point camel for pelvis ROM and abdominal activation (no more cow!)    Consulted and Agree with Plan of Care Patient      Patient will benefit from skilled therapeutic intervention in order to improve the following deficits and impairments:  Abnormal gait, Decreased activity tolerance, Difficulty walking, Decreased balance, Pain, Decreased strength, Decreased range of motion  Visit Diagnosis: Bilateral low back pain without sciatica  Other abnormalities of gait and mobility  Muscle weakness (generalized)     Problem List Patient Active Problem List   Diagnosis Date Noted  . Morbid obesity ([place]) 01/24/2015  . Metabolic syndrome 42/70/6237  . Essential hypertension, benign 04/19/2013  . Hyperlipemia 04/19/2013  . Prediabetes 04/19/2013  . Gout 04/19/2013  . Elevated transaminase level 04/19/2013  . Difficulty in walking(719.7) 11/14/2012  . Weakness of right leg 11/14/2012  . Postop Hyponatremia 10/24/2012  . OA (osteoarthritis) of hip 10/23/2012  . NONSPEC ELEVATION OF LEVELS OF TRANSAMINASE/LDH 05/20/2010    6:22 PM,04/01/2016 Elly Modena PT, DPT [facility] [place] [address] [place], [place], [address] Phone: [phone]   Fax:  [phone]  Name: ASHWIN TIBBS MRN: [id number] Date of Birth: Jul 12, 1961

## 2016-04-02 ENCOUNTER — Ambulatory Visit (INDEPENDENT_AMBULATORY_CARE_PROVIDER_SITE_OTHER): Payer: 59 | Admitting: Family Medicine

## 2016-04-02 ENCOUNTER — Encounter: Payer: Self-pay | Admitting: Family Medicine

## 2016-04-02 VITALS — BP 122/80 | Ht 71.0 in | Wt 264.0 lb

## 2016-04-02 DIAGNOSIS — E119 Type 2 diabetes mellitus without complications: Secondary | ICD-10-CM

## 2016-04-02 DIAGNOSIS — E785 Hyperlipidemia, unspecified: Secondary | ICD-10-CM | POA: Diagnosis not present

## 2016-04-02 DIAGNOSIS — I10 Essential (primary) hypertension: Secondary | ICD-10-CM | POA: Diagnosis not present

## 2016-04-02 DIAGNOSIS — M1A0790 Idiopathic chronic gout, unspecified ankle and foot, without tophus (tophi): Secondary | ICD-10-CM | POA: Diagnosis not present

## 2016-04-02 DIAGNOSIS — Z125 Encounter for screening for malignant neoplasm of prostate: Secondary | ICD-10-CM | POA: Diagnosis not present

## 2016-04-02 DIAGNOSIS — Z79899 Other long term (current) drug therapy: Secondary | ICD-10-CM | POA: Diagnosis not present

## 2016-04-02 DIAGNOSIS — R748 Abnormal levels of other serum enzymes: Secondary | ICD-10-CM

## 2016-04-02 LAB — POCT GLYCOSYLATED HEMOGLOBIN (HGB A1C): HEMOGLOBIN A1C: 5.6

## 2016-04-02 NOTE — Progress Notes (Addendum)
Subjective:    Patient ID: Manuel Torres, male    DOB: 1961-10-06, 55 y.o.   MRN: [id number]  Hypertension This is a chronic problem. The current episode started more than 1 year ago. Pertinent negatives include no chest pain. Compliance problems include exercise (tries to eat  healthy, hard to exercise with back and hip pain. walks with a cane).    saw Dr. Sheria Lang and was suppose to send over some notes for you to review. Saw for back and hip pain.  Will see Dr. Ronnald Ramp neurosurgeon next week for a 2nd opinion.  He is being evaluated for neuropathic pain in his upper legs and lower back that he states is due to nerve impingement in his back although they are at this point in time a has a 10 about doing any type of surgery. He denies any gout flareups currently but is taking medication He does state that he is interested in getting PSA for screening purposes Patient does have hyperlipidemia takes medication but does need lab work recent lab work reviewed Blood pressures been under good control with medication. Does take medication for arthralgia which can affect kidney function so therefore testing necessary recent lab work reviewed Prediabetes. A1C today.  5.6. Pt states he only took metformin for about one month because there were no refills on it. The patient relates he is trying to watch his diet he is not been on metformin for a while Review of Systems  Constitutional: Negative for activity change, appetite change and fatigue.  HENT: Negative for congestion.   Respiratory: Negative for cough.   Cardiovascular: Negative for chest pain.  Gastrointestinal: Negative for abdominal pain.  Endocrine: Negative for polydipsia and polyphagia.  Musculoskeletal: Positive for back pain and arthralgias.  Neurological: Negative for weakness.  Psychiatric/Behavioral: Negative for confusion.   25 minutes was spent with the patient. Greater than half the time was spent in discussion and answering  questions and counseling regarding the issues that the patient came in for today.     Objective:   Physical Exam  Constitutional: He appears well-nourished. No distress.  Cardiovascular: Normal rate, regular rhythm and normal heart sounds.   No murmur heard. Pulmonary/Chest: Effort normal and breath sounds normal. No respiratory distress.  Musculoskeletal: He exhibits no edema.  Lymphadenopathy:    He has no cervical adenopathy.  Neurological: He is alert.  Psychiatric: His behavior is normal.  Vitals reviewed.   His abdomen soft and obese he does have fairly good flexibility of his hips given his arthritis.      Assessment & Plan:  1. Type 2 diabetes mellitus without complication, unspecified long term insulin use status (HCC) A1c overall looks good. Watch diet try to lose weight. - POCT glycosylated hemoglobin (Hb A1C) - Microalbumin / creatinine urine ratio  2. Essential hypertension, benign Blood pressure under decent control continue current measures watch diet low-salt diet moderate exercise as tolerated - Lipid panel - Basic metabolic panel  3. Hyperlipemia Previous lab work reviewed importance of checking lipid liver profile discussed. These tests were ordered. - Lipid panel  4. 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 Screening for prostate cancer Patient due for screening. - PSA  6. High risk medication use Because of his medications and his weight need to check liver profile - Hepatic function panel  7. Elevated liver enzymes Patient has history  of elevated liver enzymes may need further testing. At risk for nonalcoholic steatohepatitis and cirrhosis. Importance weight loss discussed.  Patient also has spinal stenosis he is getting a second opinion from neurosurgery this greatly limits his physical activity. - Iron Binding Cap  (TIBC) - Ferritin - Sedimentation rate - Antinuclear Antib (ANA) - Ceruloplasmin - Hepatitis B surface antigen - Hepatitis C antibody - US ABDOMEN COMPLETE W/ELASTOGRAPHY

## 2016-04-06 ENCOUNTER — Ambulatory Visit (HOSPITAL_COMMUNITY): Payer: 59

## 2016-04-06 DIAGNOSIS — M545 Low back pain, unspecified: Secondary | ICD-10-CM

## 2016-04-06 DIAGNOSIS — M6281 Muscle weakness (generalized): Secondary | ICD-10-CM

## 2016-04-06 DIAGNOSIS — R2689 Other abnormalities of gait and mobility: Secondary | ICD-10-CM

## 2016-04-06 NOTE — Therapy (Signed)
[place], [place], [address] Phone: [phone]   Fax:  [phone]  Physical Therapy Treatment  Patient Details  Name: Manuel Torres MRN: [id number] Date of Birth: [birth year] Referring Provider: Lorayne Marek  Encounter Date: 04/06/2016      PT End of Session - 04/06/16 0950    Visit Number 23   Number of Visits 26   Date for PT Re-Evaluation 04/18/16   Authorization Type united healthcare    Authorization - Visit Number 23   Authorization - Number of Visits 26   PT Start Time 0906   PT Stop Time 1000   PT Time Calculation (min) 54 min   Activity Tolerance Patient tolerated treatment well   Behavior During Therapy [facility] for tasks assessed/performed      Past Medical History  Diagnosis Date  . Hypertension   . Hypercholesteremia   . Gout     NO RECENT FLARE UPS  . Arthritis     Hip  . Gout     Past Surgical History  Procedure Laterality Date  . Right inguinal hernia    . Colonoscopy  08/03/2012    Procedure: COLONOSCOPY;  Surgeon: Rogene Houston, MD;  [facility];  Service: Endoscopy;  Laterality: N/A;  830  . Total hip arthroplasty  10/23/2012    Procedure: TOTAL HIP ARTHROPLASTY;  Surgeon: Gearlean Alf, MD;  Location: [facility];  Service: Orthopedics;  Laterality: Right;  . Joint replacement      r hip Dr. Maureen Ralphs Dec 2013    There were no vitals filed for this visit.      Subjective Assessment - 04/06/16 0911    Subjective Pt stated he has apt with another neurosurgeon in [place] later today for a second opinion.  Feels great benefits from therapy and feels he is getting stronger and is assisting with pain.  Current pain scale 2-3/10 for lower back and radicular burning symptoms ending at anterior knee for Lt LE and all the way down to ankle on Rt LE.  Reports compliance with HEP daily.   Pertinent History Rt THR 2013, HTN,    Patient Stated Goals Pt's goals include reducing  his LBP/L hip pain and ambulate without an AD.    Currently in Pain? Yes   Pain Score 3    Pain Location Back   Pain Orientation Lower   Pain Descriptors / Indicators Burning;Aching   Pain Type Chronic pain   Pain Radiating Towards Burning sensations Lt LE ending at anterior knee, Rt LE ending anterior ankle   Pain Onset More than a month ago   Pain Frequency Constant   Aggravating Factors  prolonged standing and walking   Pain Relieving Factors medication   Effect of Pain on Daily Activities limited distances/activity            [facility] Adult PT Treatment/Exercise - 04/06/16 0001    Lumbar Exercises: Stretches   Quadruped Mid Back Stretch Limitations Sidelying QL stretch with 2 pillows under side 3x 8mnute   Piriformis Stretch 3 reps;30 seconds   Piriformis Stretch Limitations supine with towel   Lumbar Exercises: Supine   Bent Knee Raise 20 reps   Bent Knee Raise Limitations with ab sets 5" holds   Other Supine Lumbar Exercises alternating bent knee rotation x10 reps.   Lumbar Exercises: Quadruped   Madcat/Old Horse 20 reps  no camel   Madcat/Old Horse Limitations limited lumbopelvic movement noted  Single Arm Raise 10 reps   Single Arm Raises Limitations bar across back   Straight Leg Raise 10 reps   Straight Leg Raises Limitations bar across bar to keep level   Manual Therapy   Manual Therapy Soft tissue mobilization;Myofascial release   Myofascial Release Lt ITB/quad/QL            PT Short Term Goals - 03/19/16 1345    PT SHORT TERM GOAL #1   Title Pt will improve trunk/hip  ROM to be able to pick an item off the ground    Status Partially Met  easier, but still difficult with bending.    PT SHORT TERM GOAL #2   Title Pt will improve strength by one grade to allow pt to feel confident walking with a cane    Status Achieved   PT SHORT TERM GOAL #3   Title Pt will report a 50% decrease in pain to allow improved sleeping    Status Achieved   PT SHORT TERM  GOAL #4   Title Pt will be independent in HEP to allow the above to ocur in a timely manner.    Status Achieved           PT Long Term Goals - 03/19/16 1347    PT LONG TERM GOAL #1   Title Pt strength will be improved to 5/5 to allow him to walk without an assistive device  able to perform with improved strength, but pain rather  than weakness.   Status Partially Met   PT LONG TERM GOAL #2   Title PT to be able to demonstrate good body mechanics while lifting for protecion of his low back   Pt reports has not been done in the clinic.    Time 8   Period Weeks   Status On-going   PT LONG TERM GOAL #3   Title Pt pain level to decrease to no greater than a 3/10 80% of the day to be able to return to work    Time 8   Period Weeks   Status Achieved   PT LONG TERM GOAL #4   Title PT to be able to come sit to stand without the use of his Upper extremities  modified indep   Time 8   Period Weeks   Status Achieved   PT LONG TERM GOAL #5   Title Pt to be able to walk 1000 ft without assistive device in six minutes to be able to ambulate in the community with ease    Baseline cutoff at 5 minutes as other two tests were that far.    Status Partially Met   PT LONG TERM GOAL #6   Title Pt to be able to go up and down two flights of steps with no increased pain to be able to complete work activities   able to perform safely, but left hip valgus/medical collapse is pronounced , L lateral hip pain is worsening.    Time 8   Status Partially Met               Plan - 04/06/16 1011    Clinical Impression Statement Session focus on improving core stabiltiy and proximal musculature strengthening as well as manual techniques to improve relaxation and decrease pain.  Added SLR in quadruped position for guteal strengtheing with cueing for core stabiltiy with exercise.  Ended session with myofascial release techniques to Lt ITB, QL and gluteal musculature to reduce tightness with reports of  pain reduced.  Pt continues to demonstrate limited pelvic control/ mobiltiy as well as tight hip flexibilty.  Added quadratus lumborum stretch HEP, pt able to demonstrate and verbalize appropriate form and technique with new HEP.     Rehab Potential Good   PT Frequency 2x / week   PT Duration --  total 12 weeks   PT Treatment/Interventions Electrical Stimulation;Cryotherapy;Gait training;Stair training;Functional mobility training;Therapeutic activities;Therapeutic exercise;Balance training;Patient/family education;Manual techniques;Moist Heat;Traction;Ultrasound   PT Next Visit Plan Continue with core stabiltiy/mobility and manual technqiues to lower back and hips   PT Home Exercise Plan DKTC isometrics 10x5s starting with supine, feet elevated. 4-point camel for pelvis ROM and abdominal activation (no more cow!); added sidelying QL stretch      Patient will benefit from skilled therapeutic intervention in order to improve the following deficits and impairments:  Abnormal gait, Decreased activity tolerance, Difficulty walking, Decreased balance, Pain, Decreased strength, Decreased range of motion  Visit Diagnosis: Bilateral low back pain without sciatica  Other abnormalities of gait and mobility  Muscle weakness (generalized)     Problem List Patient Active Problem List   Diagnosis Date Noted  . Morbid obesity ([place]) 01/24/2015  . Metabolic syndrome 04/08/9101  . Essential hypertension, benign 04/19/2013  . Hyperlipemia 04/19/2013  . Prediabetes 04/19/2013  . Gout 04/19/2013  . Elevated transaminase level 04/19/2013  . Difficulty in walking(719.7) 11/14/2012  . Weakness of right leg 11/14/2012  . Postop Hyponatremia 10/24/2012  . OA (osteoarthritis) of hip 10/23/2012  . NONSPEC ELEVATION OF LEVELS OF TRANSAMINASE/LDH 05/20/2010   Ihor Austin, LPTA; CBIS [phone]  Aldona Lento 04/06/2016, 10:21 AM  [place], [place], [address] Phone: [phone]   Fax:  [phone]  Name: DAVEON ARPINO MRN: [id number] Date of Birth: [birth year]

## 2016-04-06 NOTE — Patient Instructions (Signed)
   SIDELYING QUADRATUS LUMBORUM STRETCH - SINGLE LEG  Start by lying on your side with 1-2 pillows rolled up under side and your knees bent initially.  Next, lower your top leg off the front of the table/bed and allow your pelvis to bend for a stretch to your lower back.  Complete 3 sets 1 minute holds and repeat 1-2 times a day.

## 2016-04-08 ENCOUNTER — Ambulatory Visit (HOSPITAL_COMMUNITY): Payer: 59

## 2016-04-08 DIAGNOSIS — M545 Low back pain, unspecified: Secondary | ICD-10-CM

## 2016-04-08 DIAGNOSIS — R2689 Other abnormalities of gait and mobility: Secondary | ICD-10-CM

## 2016-04-08 DIAGNOSIS — M2560 Stiffness of unspecified joint, not elsewhere classified: Secondary | ICD-10-CM

## 2016-04-08 DIAGNOSIS — R269 Unspecified abnormalities of gait and mobility: Secondary | ICD-10-CM

## 2016-04-08 DIAGNOSIS — Z7409 Other reduced mobility: Secondary | ICD-10-CM

## 2016-04-08 DIAGNOSIS — M6281 Muscle weakness (generalized): Secondary | ICD-10-CM

## 2016-04-08 DIAGNOSIS — R29898 Other symptoms and signs involving the musculoskeletal system: Secondary | ICD-10-CM

## 2016-04-08 DIAGNOSIS — R6889 Other general symptoms and signs: Secondary | ICD-10-CM

## 2016-04-08 NOTE — Therapy (Signed)
[place], [place], [address] Phone: [phone]   Fax:  [phone]  Physical Therapy Treatment  Patient Details  Name: Manuel Torres MRN: [id number] Date of Birth: 04/24/1961 Referring Provider: Lorayne Marek  Encounter Date: 04/08/2016      PT End of Session - 04/08/16 1204    Visit Number 24   Number of Visits 26   Date for PT Re-Evaluation 04/18/16   Authorization Type united healthcare    Authorization - Visit Number 24   Authorization - Number of Visits 26   PT Start Time 1115   PT Stop Time 1155   PT Time Calculation (min) 40 min   Activity Tolerance Patient tolerated treatment well;No increased pain   Behavior During Therapy [facility] for tasks assessed/performed      Past Medical History  Diagnosis Date  . Hypertension   . Hypercholesteremia   . Gout     NO RECENT FLARE UPS  . Arthritis     Hip  . Gout     Past Surgical History  Procedure Laterality Date  . Right inguinal hernia    . Colonoscopy  08/03/2012    Procedure: COLONOSCOPY;  Surgeon: Rogene Houston, MD;  [facility];  Service: Endoscopy;  Laterality: N/A;  830  . Total hip arthroplasty  10/23/2012    Procedure: TOTAL HIP ARTHROPLASTY;  Surgeon: Gearlean Alf, MD;  [facility];  Service: Orthopedics;  Laterality: Right;  . Joint replacement      r hip Dr. Maureen Ralphs Dec 2013    There were no vitals filed for this visit.      Subjective Assessment - 04/08/16 1121    Subjective Pt reports pain is remaining consistent, not much worse at this point. Pt had followup with new neurosurgeon, who is recommending some surgical intervention in the near future.    Pertinent History Rt THR 2013, HTN,    Limitations Standing;Walking   How long can you sit comfortably? able to sit for two hours, largely unchanged since 3WA   How long can you stand comfortably? 15 minutes with weight shifting, leaning etc, performing a task.     How long can you walk comfortably? 30 minutes with RW as neededl; shorter distance are performed without AD, but then uses the [facility] after 10-15 min.    Diagnostic tests MRI:  spinal stenosis    Patient Stated Goals Pt's goals include reducing his LBP/L hip pain and ambulate without an AD.    Currently in Pain? Yes   Pain Score 3    Pain Location Back   Pain Orientation Lower   Pain Descriptors / Indicators Burning;Aching   Pain Type Chronic pain                         [facility] Adult PT Treatment/Exercise - 04/08/16 0001    Posture/Postural Control   Posture/Postural Control Postural limitations   Postural Limitations Increased lumbar lordosis;Anterior pelvic tilt   Therapeutic Activites    Therapeutic Activities Other Therapeutic Activities   Other Therapeutic Activities Nu-Step: 5' L0, 34mn L3; seat 13, arms 10  HIITx2: 30sec at L7. EndBP: 155/88, HR:98bpm   Lumbar Exercises: Stretches   Piriformis Stretch --  please discontinue for future session.    Lumbar Exercises: Aerobic   Tread Mill 5 min @ 7%GR, 1.047m;   33m78m'@0' %Gr, 1.0mp85m Lumbar Exercises: Machines for Strengthening   Other  Lumbar Machine Exercise NUSTEP                PT Education - 04/08/16 1203    Education provided Yes   Education Details Explained benefits of general fitness level in preparation for potential back surgery; explained how incline walking and recumbent bike can improve functional acitivty tolerance in spinal stenosis patients.    Person(s) Educated Patient   Methods Explanation   Comprehension Returned demonstration;Verbalized understanding          PT Short Term Goals - 03/19/16 1345    PT SHORT TERM GOAL #1   Title Pt will improve trunk/hip  ROM to be able to pick an item off the ground    Status Partially Met  easier, but still difficult with bending.    PT SHORT TERM GOAL #2   Title Pt will improve strength by one grade to allow pt to feel confident walking  with a cane    Status Achieved   PT SHORT TERM GOAL #3   Title Pt will report a 50% decrease in pain to allow improved sleeping    Status Achieved   PT SHORT TERM GOAL #4   Title Pt will be independent in HEP to allow the above to ocur in a timely manner.    Status Achieved           PT Long Term Goals - 03/19/16 1347    PT LONG TERM GOAL #1   Title Pt strength will be improved to 5/5 to allow him to walk without an assistive device  able to perform with improved strength, but pain rather  than weakness.   Status Partially Met   PT LONG TERM GOAL #2   Title PT to be able to demonstrate good body mechanics while lifting for protecion of his low back   Pt reports has not been done in the clinic.    Time 8   Period Weeks   Status On-going   PT LONG TERM GOAL #3   Title Pt pain level to decrease to no greater than a 3/10 80% of the day to be able to return to work    Time 8   Period Weeks   Status Achieved   PT LONG TERM GOAL #4   Title PT to be able to come sit to stand without the use of his Upper extremities  modified indep   Time 8   Period Weeks   Status Achieved   PT LONG TERM GOAL #5   Title Pt to be able to walk 1000 ft without assistive device in six minutes to be able to ambulate in the community with ease    Baseline cutoff at 5 minutes as other two tests were that far.    Status Partially Met   PT LONG TERM GOAL #6   Title Pt to be able to go up and down two flights of steps with no increased pain to be able to complete work activities   able to perform safely, but left hip valgus/medical collapse is pronounced , L lateral hip pain is worsening.    Time 8   Status Partially Met               Plan - 04/08/16 1208    Clinical Impression Statement Discussed with patient the findings from his visit with new neuro referral, who reports worsenign spinal stenosis and is recommending continued core strengthening as well as surgical intervention. I disucssed  with  patient that last few sessiosn will focus on preparing him to better imrpove selfcare and overall we4llness s/p DC to remain at highest functional tolerance for postop recovery. We discussed beefits of pool therapy/pool exercises for lumbar spinal stenosis, as well as inclided treadmill, and recumbent seated aerobic. The patient responded well to these, tolerating the most physical assistance he has been able to do in months. H   Rehab Potential Fair   PT Frequency 2x / week   PT Treatment/Interventions Electrical Stimulation;Cryotherapy;Gait training;Stair training;Functional mobility training;Therapeutic activities;Therapeutic exercise;Balance training;Patient/family education;Manual techniques;Moist Heat;Traction;Ultrasound   PT Next Visit Plan Review activity at the [facility] over weekend. Repeat HIIT, and continue core training into flexion.    PT Home Exercise Plan Start preparing advanced HEP for DC.    Consulted and Agree with Plan of Care Patient      Patient will benefit from skilled therapeutic intervention in order to improve the following deficits and impairments:  Abnormal gait, Decreased activity tolerance, Difficulty walking, Decreased balance, Pain, Decreased strength, Decreased range of motion   Visit Diagnosis: Bilateral low back pain without sciatica  Other abnormalities of gait and mobility  Muscle weakness (generalized)  Low back pain potentially associated with radiculopathy  Stiffness due to immobility  Abnormality of gait  Activity intolerance  Weakness of both legs     Problem List Patient Active Problem List   Diagnosis Date Noted  . Morbid obesity ([place]) 01/24/2015  . Metabolic syndrome 77/37/3668  . Essential hypertension, benign 04/19/2013  . Hyperlipemia 04/19/2013  . Prediabetes 04/19/2013  . Gout 04/19/2013  . Elevated transaminase level 04/19/2013  . Difficulty in walking(719.7) 11/14/2012  . Weakness of right leg 11/14/2012  . Postop  Hyponatremia 10/24/2012  . OA (osteoarthritis) of hip 10/23/2012  . NONSPEC ELEVATION OF LEVELS OF TRANSAMINASE/LDH 05/20/2010   12:14 PM, 04/08/2016 Etta Grandchild, PT, DPT PRN Physical Therapist at [facility] # [id number] [phone] (office)     [place], [place], [address] Phone: [phone]   Fax:  [phone]  Name: KOA ZOELLER MRN: [id number] Date of Birth: 1961-10-22

## 2016-04-08 NOTE — Patient Instructions (Signed)
I asked pt to investigate the following: 1. Pool exercise schedule at his [employer] 2. Try out some chest level pool activity to see if his standing tolerance improves.  3. Perform 62minutes of inclined treadmill walking this week at [facility] (5-7% grade)  4. Perform 5-10 minutes NuStep or recumbent bike this week at [facility]

## 2016-04-13 ENCOUNTER — Ambulatory Visit (HOSPITAL_COMMUNITY): Payer: 59

## 2016-04-13 DIAGNOSIS — M545 Low back pain, unspecified: Secondary | ICD-10-CM

## 2016-04-13 DIAGNOSIS — M6281 Muscle weakness (generalized): Secondary | ICD-10-CM

## 2016-04-13 DIAGNOSIS — R2689 Other abnormalities of gait and mobility: Secondary | ICD-10-CM

## 2016-04-13 NOTE — Patient Instructions (Signed)
   SIDELYING QUADRATUS LUMBORUM STRETCH - SINGLE LEG  Start by lying on your side with 1-2 pillows rolled up under side and your knees bent initially. Next, lower your top leg off the front of the table/bed and allow your pelvis to bend for a stretch to your lower back. Complete 3 sets 1 minute holds and repeat 1-2 times a day.   Arm / Leg Extension: Alternate (All-Fours)    Raise right arm and opposite leg. Do not arch neck. Repeat 10x 5" times per set. Do 1-2 sets per session. http://orth.exer.us/110   Copyright  VHI. All rights reserved.

## 2016-04-13 NOTE — Therapy (Signed)
[place], [place], [address] Phone: [phone]   Fax:  [phone]  Physical Therapy Treatment  Patient Details  Name: Manuel Torres MRN: [id number] Date of Birth: 11/19/1960 Referring Provider: Lorayne Marek  Encounter Date: 04/13/2016      PT End of Session - 04/13/16 0913    Visit Number 25   Number of Visits 26   Date for PT Re-Evaluation 04/18/16   Authorization Type united healthcare    Authorization - Visit Number 25   Authorization - Number of Visits 26   PT Start Time 0906   PT Stop Time 0958   PT Time Calculation (min) 52 min   Activity Tolerance Patient tolerated treatment well;No increased pain   Behavior During Therapy [facility] for tasks assessed/performed      Past Medical History  Diagnosis Date  . Hypertension   . Hypercholesteremia   . Gout     NO RECENT FLARE UPS  . Arthritis     Hip  . Gout     Past Surgical History  Procedure Laterality Date  . Right inguinal hernia    . Colonoscopy  08/03/2012    Procedure: COLONOSCOPY;  Surgeon: Rogene Houston, MD;  [facility];  Service: Endoscopy;  Laterality: N/A;  830  . Total hip arthroplasty  10/23/2012    Procedure: TOTAL HIP ARTHROPLASTY;  Surgeon: Gearlean Alf, MD;  Location: [facility];  Service: Orthopedics;  Laterality: Right;  . Joint replacement      r hip Dr. Maureen Ralphs Dec 2013    There were no vitals filed for this visit.      Subjective Assessment - 04/13/16 0911    Subjective Pt reports he was out of town and unable to go to the [facility] over weekend, did complete HEP.  Pt reports pain scale around the same, current pain scale 3-4/10 lower back and Lt hip, continues  to have burning anterior thigh to Lt knee and Rt LE ending at calf   Pertinent History Rt THR 2013, HTN,    Patient Stated Goals Pt's goals include reducing his LBP/L hip pain and ambulate without an AD.    Currently in Pain? Yes   Pain Score 4    Pain  Location Back   Pain Orientation Lower   Pain Descriptors / Indicators Burning;Dull;Constant;Tightness   Pain Type Chronic pain   Pain Radiating Towards Burning sensations Lt LE ending at anterior knee, Rt LE ending anterior ankle   Pain Onset More than a month ago   Pain Frequency Constant   Aggravating Factors  prolonged standing and walking   Pain Relieving Factors medication   Effect of Pain on Daily Activities limited distances/activity                [facility] Adult PT Treatment/Exercise - 04/13/16 0001    Lumbar Exercises: Aerobic   Stationary Bike Nustep L3; resistance 4 10 minutes EOS no charge   Lumbar Exercises: Supine   Bent Knee Raise 20 reps   Bent Knee Raise Limitations with ab sets 5" holds dead bug   Other Supine Lumbar Exercises alternating bent knee rotation x10 reps.   Lumbar Exercises: Quadruped   Madcat/Old Horse 20 reps  no camel back   Madcat/Old Horse Limitations limited lumbopelvic movement noted   Opposite Arm/Leg Raise Right arm/Left leg;Left arm/Right leg;10 reps;3 seconds   Manual Therapy   Manual Therapy Soft tissue mobilization;Myofascial release   Myofascial Release  Lt gluteal/ITB/quad/QL                  PT Short Term Goals - 03/19/16 1345    PT SHORT TERM GOAL #1   Title Pt will improve trunk/hip  ROM to be able to pick an item off the ground    Status Partially Met  easier, but still difficult with bending.    PT SHORT TERM GOAL #2   Title Pt will improve strength by one grade to allow pt to feel confident walking with a cane    Status Achieved   PT SHORT TERM GOAL #3   Title Pt will report a 50% decrease in pain to allow improved sleeping    Status Achieved   PT SHORT TERM GOAL #4   Title Pt will be independent in HEP to allow the above to ocur in a timely manner.    Status Achieved           PT Long Term Goals - 03/19/16 1347    PT LONG TERM GOAL #1   Title Pt strength will be improved to 5/5 to allow him to walk  without an assistive device  able to perform with improved strength, but pain rather  than weakness.   Status Partially Met   PT LONG TERM GOAL #2   Title PT to be able to demonstrate good body mechanics while lifting for protecion of his low back   Pt reports has not been done in the clinic.    Time 8   Period Weeks   Status On-going   PT LONG TERM GOAL #3   Title Pt pain level to decrease to no greater than a 3/10 80% of the day to be able to return to work    Time 8   Period Weeks   Status Achieved   PT LONG TERM GOAL #4   Title PT to be able to come sit to stand without the use of his Upper extremities  modified indep   Time 8   Period Weeks   Status Achieved   PT LONG TERM GOAL #5   Title Pt to be able to walk 1000 ft without assistive device in six minutes to be able to ambulate in the community with ease    Baseline cutoff at 5 minutes as other two tests were that far.    Status Partially Met   PT LONG TERM GOAL #6   Title Pt to be able to go up and down two flights of steps with no increased pain to be able to complete work activities   able to perform safely, but left hip valgus/medical collapse is pronounced , L lateral hip pain is worsening.    Time 8   Status Partially Met               Plan - 04/13/16 0932    Clinical Impression Statement Session focus on improving core and lumbar stability.  Progressed to dead bug and opposite arm/leg to improve core training in flexion and improve lumbar stability, noted weakness and instabilty wtih new exercise requiring.  Manual trigger pioint release complete for pain control Lt piriformed/gluteal region reports of decreased pain and burning in Rt sidelying positin with pillow between knees.  Reviewed complaince with HEP and additional exercises given for core and gluteal strengthening as well as reviewed activites to complete at [facility].  End of session pt reprts pain reduced.     PT Frequency 2x / week  PT Duration --   total 12 weeks   PT Treatment/Interventions Electrical Stimulation;Cryotherapy;Gait training;Stair training;Functional mobility training;Therapeutic activities;Therapeutic exercise;Balance training;Patient/family education;Manual techniques;Moist Heat;Traction;Ultrasound   PT Next Visit Plan Reassess next session   PT Home Exercise Plan Reviewed HEP with additional QL stretch and quadruped exercise.        Patient will benefit from skilled therapeutic intervention in order to improve the following deficits and impairments:  Abnormal gait, Decreased activity tolerance, Difficulty walking, Decreased balance, Pain, Decreased strength, Decreased range of motion  Visit Diagnosis: Bilateral low back pain without sciatica  Other abnormalities of gait and mobility  Muscle weakness (generalized)     Problem List Patient Active Problem List   Diagnosis Date Noted  . Morbid obesity ([place]) 01/24/2015  . Metabolic syndrome 00/71/2197  . Essential hypertension, benign 04/19/2013  . Hyperlipemia 04/19/2013  . Prediabetes 04/19/2013  . Gout 04/19/2013  . Elevated transaminase level 04/19/2013  . Difficulty in walking(719.7) 11/14/2012  . Weakness of right leg 11/14/2012  . Postop Hyponatremia 10/24/2012  . OA (osteoarthritis) of hip 10/23/2012  . NONSPEC ELEVATION OF LEVELS OF TRANSAMINASE/LDH 05/20/2010  Ihor Austin, LPTA; [place]   Aldona Lento 04/13/2016, 12:16 PM  [place], [place], [address] Phone: [phone]   Fax:  [phone]  Name: Manuel Torres MRN: [id number] Date of Birth: January 17, 1961

## 2016-04-15 ENCOUNTER — Ambulatory Visit (HOSPITAL_COMMUNITY): Payer: 59 | Attending: Family Medicine

## 2016-04-15 DIAGNOSIS — M545 Low back pain, unspecified: Secondary | ICD-10-CM

## 2016-04-15 DIAGNOSIS — R29898 Other symptoms and signs involving the musculoskeletal system: Secondary | ICD-10-CM

## 2016-04-15 DIAGNOSIS — M2560 Stiffness of unspecified joint, not elsewhere classified: Secondary | ICD-10-CM

## 2016-04-15 DIAGNOSIS — M623 Immobility syndrome (paraplegic): Secondary | ICD-10-CM | POA: Insufficient documentation

## 2016-04-15 DIAGNOSIS — R2689 Other abnormalities of gait and mobility: Secondary | ICD-10-CM

## 2016-04-15 DIAGNOSIS — M6281 Muscle weakness (generalized): Secondary | ICD-10-CM | POA: Diagnosis present

## 2016-04-15 DIAGNOSIS — Z7409 Other reduced mobility: Secondary | ICD-10-CM

## 2016-04-15 DIAGNOSIS — R269 Unspecified abnormalities of gait and mobility: Secondary | ICD-10-CM | POA: Diagnosis present

## 2016-04-15 DIAGNOSIS — R6889 Other general symptoms and signs: Secondary | ICD-10-CM

## 2016-04-15 NOTE — Therapy (Signed)
PHYSICAL THERAPY DISCHARGE SUMMARY  Visits from Start of Care: 26  Current functional level related to goals / functional outcomes: *see below   Remaining deficits: *see below    Education / Equipment: *see below  Plan: Patient agrees to discharge.  Patient goals were partially met. Patient is being discharged due to lack of progress.  ???? Pt will be undergoing surgical procedure to address this problem.        [place], [place], [address] Phone: [phone]   Fax:  [phone]  Physical Therapy Treatment  Patient Details  Name: Manuel Torres MRN: [id number] Date of Birth: [birth year] Referring Provider: Lorayne Marek  Encounter Date: 04/15/2016      PT End of Session - 04/15/16 1208    Visit Number 26   Number of Visits 26   Date for PT Re-Evaluation 04/18/16   Authorization Type united healthcare    Authorization - Visit Number 26   Authorization - Number of Visits 26   PT Start Time 3976   PT Stop Time 1159   PT Time Calculation (min) 42 min   Activity Tolerance Patient tolerated treatment well;No increased pain   Behavior During Therapy [facility] for tasks assessed/performed      Past Medical History  Diagnosis Date  . Hypertension   . Hypercholesteremia   . Gout     NO RECENT FLARE UPS  . Arthritis     Hip  . Gout     Past Surgical History  Procedure Laterality Date  . Right inguinal hernia    . Colonoscopy  08/03/2012    Procedure: COLONOSCOPY;  Surgeon: Rogene Houston, MD;  [facility];  Service: Endoscopy;  Laterality: N/A;  830  . Total hip arthroplasty  10/23/2012    Procedure: TOTAL HIP ARTHROPLASTY;  Surgeon: Gearlean Alf, MD;  Location[facility];  Service: Orthopedics;  Laterality: Right;  . Joint replacement      r hip Dr. Maureen Ralphs Dec 2013    There were no vitals filed for this visit.      Subjective Assessment - 04/15/16 1129    Subjective Pt reports all  has been going ok at home. He still has not had a chance to visit the [employer] to attempt incline treadmill or water aerobics. He says he got an updated surgery date for his back.    Pertinent History Rt THR 2013, HTN,    Limitations Standing;Walking   How long can you sit comfortably? No change since 1MA; improved since evaluation.    How long can you stand comfortably? 15 min; No change since 1MA; improved since evaluation.    How long can you walk comfortably? 30 minutes with RW as neededl; shorter distance are performed without AD, but then uses the [facility] after 10-15 min. No change since 1MA; improved since evaluation.    Diagnostic tests MRI:  spinal stenosis    Patient Stated Goals Pt's goals include reducing his LBP/L hip pain and ambulate without an AD.    Currently in Pain? Yes   Pain Score 3    Pain Location Hip   Pain Orientation Left;Lateral   Pain Type Chronic pain   Pain Onset More than a month ago   Pain Frequency Constant   Aggravating Factors  prolonged walking/standing.    Pain Relieving Factors medication, rest in supine legs elevated.             [facility] PT  Assessment - 04/15/16 0001    Assessment   Medical Diagnosis Spinal stenosis/Lt bursitis    Referring Provider Lorayne Marek   Onset Date/Surgical Date 11/17/15   Next MD Visit NA, surgery is schedule 2nd week of June   Prior Therapy none   Precautions   Precautions Fall   Restrictions   Weight Bearing Restrictions No   Balance Screen   Has the patient fallen in the past 6 months No   Has the patient had a decrease in activity level because of a fear of falling?  No   Is the patient reluctant to leave their home because of a fear of falling?  No   Home Environment   Living Environment Private residence   Type of [place] to enter   Entrance Stairs-Number of Steps 1   [place] to live on main level with bedroom/bathroom   Prior Function   Vocation Full time employment;On  disability  Short Term Disability since Dec 2016   Vocation Requirements on feet all the time lifting up to 100 feet with assist    Leisure walking, golfing    Observation/Other Assessments   Focus on Therapeutic Outcomes (FOTO)  4  Done on 4/7; largely unchanged since start of therapy   Posture/Postural Control   Posture/Postural Control Postural limitations   Postural Limitations Increased lumbar lordosis;Anterior pelvic tilt   Strength   Overall Strength --  All strength tested on 5/5   Right Hip Flexion 5/5  3-/5 at evaluation    Right Hip Extension 4+/5  3-/5 at evaluation    Right Hip ABduction 5/5  3-/5 at evaluation    Left Hip Flexion 5/5  2+/5 at evaluation    Left Hip Extension 4/5  2/5 at evaluation    Left Hip ABduction 4+/5  2+/5 at evaluation    Right Knee Flexion 5/5   Right Knee Extension 5/5  4+/5 at evaluation    Left Knee Flexion 5/5   Left Knee Extension 5/5  4/5 at evaluation    Right Ankle Dorsiflexion 5/5   Left Ankle Dorsiflexion 5/5   Transfers   Five time sit to stand comments  unable to come sit to stand without using UE    Ambulation/Gait   Ambulation/Gait Assistance Details .39 miles   6 Minute Walk- Baseline   6 Minute Walk- Baseline no  not performed as it aggravtes pain, not tolerated well.                      [place] Adult PT Treatment/Exercise - 04/15/16 0001    Ambulation/Gait   Ambulation/Gait Yes   Ambulation/Gait Assistance 5: Supervision   Ambulation Distance (Feet) 2006 Feet   Ambulation Surface --  treadmill   Gait Comments Treadmill at 6% grade and 1.77mh    Therapeutic Activites    Therapeutic Activities Other Therapeutic Activities  HIIT training: WU, 3 intervals, CD   Other Therapeutic Activities Nu-Step: 5' L0, 737m L3; seat 13, arms 10  HIITx2:30sec at L7. Start BP:170/75 EndBP:   Lumbar Exercises: Supine   Other Supine Lumbar Exercises deadbug: 3x5 bilat alternating.   heavy verbal, tactile cues for  form. (updated for HEP)                    PT Short Term Goals - 04/15/16 1213    PT SHORT TERM GOAL #1   Title Pt will improve trunk/hip  ROM to  be able to pick an item off the ground    Time 4   Period Weeks   Status Partially Met   PT SHORT TERM GOAL #2   Title Pt will improve strength by one grade to allow pt to feel confident walking with a cane    Time 4   Period Weeks   Status Achieved   PT SHORT TERM GOAL #3   Title Pt will report a 50% decrease in pain to allow improved sleeping    Time 4   Period Weeks   Status Achieved   PT SHORT TERM GOAL #4   Title Pt will be independent in HEP to allow the above to ocur in a timely manner.    Status Partially Met           PT Long Term Goals - 04/15/16 1217    PT LONG TERM GOAL #1   Title Pt strength will be improved to 5/5 to allow him to walk without an assistive device   Time 8   Period Weeks   Status Partially Met   PT LONG TERM GOAL #2   Title PT to be able to demonstrate good body mechanics while lifting for protecion of his low back    Time 8   Period Weeks   Status Not Met   PT LONG TERM GOAL #3   Title Pt pain level to decrease to no greater than a 3/10 80% of the day to be able to return to work    Time 8   Period Weeks   Status Not Met   PT LONG TERM GOAL #4   Title Pt to be able to come sit to stand without the use of his Upper extremities   Time 8   Period Weeks   Status Achieved   PT LONG TERM GOAL #5   Title Pt to be able to walk 1000 ft without assistive device in six minutes to be able to ambulate in the community with ease    Baseline cutoff at 5 minutes as other two tests were that far.    Time 8   Period Weeks   Status Not Met  overground walking is still limited by worsening of LBP symptoms.   PT LONG TERM GOAL #6   Title Pt to be able to go up and down two flights of steps with no increased pain to be able to complete work activities    Time 8   Period Weeks   Status Partially  Met               Plan - 04/15/16 1209    Clinical Impression Statement Today's session focused on improvig tolerance to ambulatory activity adn general exercises and conditioning for pt compliance at [facility] after DC. The patient demonstrated improved tolerance to AMB covering 0.47mle sin 15 minutes at a 6% grade to reduce effects of lumbar spinal stenosis/claudication.  Pt also able to increase the numbe rof high intensity intervals today, with some HTN, but otherwise VSS adn subjectively with no complaint. Pt expresses confidence in HEP indep for DC and willl try to integrate some workuots at the gym prior to his surgery in less than 10 days.    Rehab Potential Fair   PT Frequency 2x / week   PT Treatment/Interventions Electrical Stimulation;Cryotherapy;Gait training;Stair training;Functional mobility training;Therapeutic activities;Therapeutic exercise;Balance training;Patient/family education;Manual techniques;Moist Heat;Traction;Ultrasound   PT Home Exercise Plan Added dead bug, 4 sets of 5 every other day.  Consulted and Agree with Plan of Care Patient      Patient will benefit from skilled therapeutic intervention in order to improve the following deficits and impairments:  Abnormal gait, Decreased activity tolerance, Difficulty walking, Decreased balance, Pain, Decreased strength, Decreased range of motion  Visit Diagnosis: Bilateral low back pain without sciatica  Other abnormalities of gait and mobility  Muscle weakness (generalized)  Low back pain potentially associated with radiculopathy  Stiffness due to immobility  Abnormality of gait  Activity intolerance  Weakness of both legs     Problem List Patient Active Problem List   Diagnosis Date Noted  . Morbid obesity ([place]) 01/24/2015  . Metabolic syndrome 85/27/7824  . Essential hypertension, benign 04/19/2013  . Hyperlipemia 04/19/2013  . Prediabetes 04/19/2013  . Gout 04/19/2013  . Elevated  transaminase level 04/19/2013  . Difficulty in walking(719.7) 11/14/2012  . Weakness of right leg 11/14/2012  . Postop Hyponatremia 10/24/2012  . OA (osteoarthritis) of hip 10/23/2012  . NONSPEC ELEVATION OF LEVELS OF TRANSAMINASE/LDH 05/20/2010   12:27 PM, 04/15/2016 Etta Grandchild, PT, DPT PRN Physical Therapist at [place] # [id number] [phone] (office)     [place], [place], [address] Phone: [phone]   Fax:  [phone]  Name: Manuel Torres MRN: [id number] Date of Birth: 04-16-1961

## 2016-04-17 LAB — PSA: PROSTATE SPECIFIC AG, SERUM: 1.1 ng/mL (ref 0.0–4.0)

## 2016-04-17 LAB — BASIC METABOLIC PANEL
BUN/Creatinine Ratio: 17 (ref 9–20)
BUN: 15 mg/dL (ref 6–24)
CO2: 23 mmol/L (ref 18–29)
Calcium: 9.2 mg/dL (ref 8.7–10.2)
Chloride: 99 mmol/L (ref 96–106)
Creatinine, Ser: 0.86 mg/dL (ref 0.76–1.27)
GFR calc Af Amer: 113 mL/min/{1.73_m2} (ref >59)
GFR calc non Af Amer: 98 mL/min/{1.73_m2} (ref >59)
Glucose: 117 mg/dL — ABNORMAL HIGH (ref 65–99)
POTASSIUM: 4.4 mmol/L (ref 3.5–5.2)
SODIUM: 137 mmol/L (ref 134–144)

## 2016-04-17 LAB — URIC ACID: URIC ACID: 5.6 mg/dL (ref 3.7–8.6)

## 2016-04-17 LAB — HEPATIC FUNCTION PANEL
ALBUMIN: 4.3 g/dL (ref 3.5–5.5)
ALK PHOS: 82 IU/L (ref 39–117)
ALT: 53 IU/L — ABNORMAL HIGH (ref 0–44)
AST: 48 IU/L — AB (ref 0–40)
Bilirubin Total: 0.5 mg/dL (ref 0.0–1.2)
Bilirubin, Direct: 0.19 mg/dL (ref 0.00–0.40)
TOTAL PROTEIN: 7 g/dL (ref 6.0–8.5)

## 2016-04-17 LAB — LIPID PANEL
CHOLESTEROL TOTAL: 129 mg/dL (ref 100–199)
Chol/HDL Ratio: 3.8 ratio units (ref 0.0–5.0)
HDL: 34 mg/dL — AB (ref >39)
LDL CALC: 80 mg/dL (ref 0–99)
Triglycerides: 74 mg/dL (ref 0–149)
VLDL CHOLESTEROL CAL: 15 mg/dL (ref 5–40)

## 2016-04-17 LAB — UNABLE TO VOID

## 2016-04-20 ENCOUNTER — Other Ambulatory Visit: Payer: Self-pay | Admitting: Family Medicine

## 2016-04-20 NOTE — Addendum Note (Signed)
Addended by: Dairl Ponder on: 04/20/2016 01:49 PM   Modules accepted: Orders

## 2016-04-23 ENCOUNTER — Other Ambulatory Visit: Payer: Self-pay | Admitting: Family Medicine

## 2016-04-27 ENCOUNTER — Ambulatory Visit (HOSPITAL_COMMUNITY): Payer: 59

## 2016-05-10 NOTE — Telephone Encounter (Signed)
Opened in error

## 2016-05-18 ENCOUNTER — Other Ambulatory Visit: Payer: Self-pay | Admitting: Family Medicine

## 2016-05-19 ENCOUNTER — Ambulatory Visit (HOSPITAL_COMMUNITY)
Admission: RE | Admit: 2016-05-19 | Discharge: 2016-05-19 | Disposition: A | Discharge status: Home / Self Care | Payer: 59 | Source: Ambulatory Visit | Attending: Family Medicine | Admitting: Family Medicine

## 2016-05-19 ENCOUNTER — Other Ambulatory Visit (HOSPITAL_COMMUNITY)
Admission: AD | Admit: 2016-05-19 | Discharge: 2016-05-19 | Disposition: A | Discharge status: Home / Self Care | Payer: 59 | Source: Ambulatory Visit | Attending: *Deleted | Admitting: *Deleted

## 2016-05-19 ENCOUNTER — Emergency Department (HOSPITAL_COMMUNITY)
Admission: EM | Admit: 2016-05-19 | Discharge: 2016-05-19 | Disposition: A | Discharge status: Home / Self Care | Payer: 59 | Attending: Emergency Medicine | Admitting: Emergency Medicine

## 2016-05-19 ENCOUNTER — Emergency Department (HOSPITAL_COMMUNITY): Payer: 59

## 2016-05-19 ENCOUNTER — Encounter (HOSPITAL_COMMUNITY): Payer: Self-pay | Admitting: Emergency Medicine

## 2016-05-19 ENCOUNTER — Ambulatory Visit (INDEPENDENT_AMBULATORY_CARE_PROVIDER_SITE_OTHER): Payer: 59 | Admitting: Family Medicine

## 2016-05-19 ENCOUNTER — Encounter: Payer: Self-pay | Admitting: Family Medicine

## 2016-05-19 VITALS — BP 136/96 | Ht 71.0 in | Wt 236.2 lb

## 2016-05-19 DIAGNOSIS — M549 Dorsalgia, unspecified: Secondary | ICD-10-CM | POA: Diagnosis not present

## 2016-05-19 DIAGNOSIS — Z7982 Long term (current) use of aspirin: Secondary | ICD-10-CM | POA: Insufficient documentation

## 2016-05-19 DIAGNOSIS — R29898 Other symptoms and signs involving the musculoskeletal system: Secondary | ICD-10-CM

## 2016-05-19 DIAGNOSIS — R7989 Other specified abnormal findings of blood chemistry: Secondary | ICD-10-CM | POA: Diagnosis present

## 2016-05-19 DIAGNOSIS — M4806 Spinal stenosis, lumbar region: Secondary | ICD-10-CM

## 2016-05-19 DIAGNOSIS — M169 Osteoarthritis of hip, unspecified: Secondary | ICD-10-CM | POA: Diagnosis not present

## 2016-05-19 DIAGNOSIS — R042 Hemoptysis: Secondary | ICD-10-CM | POA: Insufficient documentation

## 2016-05-19 DIAGNOSIS — M48061 Spinal stenosis, lumbar region without neurogenic claudication: Secondary | ICD-10-CM

## 2016-05-19 DIAGNOSIS — I10 Essential (primary) hypertension: Secondary | ICD-10-CM | POA: Diagnosis not present

## 2016-05-19 DIAGNOSIS — Z79899 Other long term (current) drug therapy: Secondary | ICD-10-CM | POA: Insufficient documentation

## 2016-05-19 LAB — CBC WITH DIFFERENTIAL/PLATELET
Basophils Absolute: 0 10*3/uL (ref 0.0–0.1)
Basophils Relative: 1 %
EOS ABS: 0.4 10*3/uL (ref 0.0–0.7)
EOS PCT: 6 %
HCT: 41.6 % (ref 39.0–52.0)
HEMOGLOBIN: 14.6 g/dL (ref 13.0–17.0)
LYMPHS ABS: 2.5 10*3/uL (ref 0.7–4.0)
LYMPHS PCT: 38 %
MCH: 32.6 pg (ref 26.0–34.0)
MCHC: 35.1 g/dL (ref 30.0–36.0)
MCV: 92.9 fL (ref 78.0–100.0)
MONO ABS: 0.6 10*3/uL (ref 0.1–1.0)
MONOS PCT: 9 %
NEUTROS ABS: 3.1 10*3/uL (ref 1.7–7.7)
Neutrophils Relative %: 46 %
PLATELETS: 361 10*3/uL (ref 150–400)
RBC: 4.48 MIL/uL (ref 4.22–5.81)
RDW: 12.7 % (ref 11.5–15.5)
WBC: 6.6 10*3/uL (ref 4.0–10.5)

## 2016-05-19 LAB — BASIC METABOLIC PANEL
ANION GAP: 7 (ref 5–15)
BUN: 11 mg/dL (ref 6–20)
CO2: 27 mmol/L (ref 22–32)
Calcium: 8.9 mg/dL (ref 8.9–10.3)
Chloride: 102 mmol/L (ref 101–111)
Creatinine, Ser: 0.87 mg/dL (ref 0.61–1.24)
GFR calc Af Amer: >60 mL/min (ref >60)
GLUCOSE: 108 mg/dL — AB (ref 65–99)
POTASSIUM: 3.7 mmol/L (ref 3.5–5.1)
Sodium: 136 mmol/L (ref 135–145)

## 2016-05-19 LAB — D-DIMER, QUANTITATIVE (NOT AT ARMC): D DIMER QUANT: 2.67 ug{FEU}/mL — AB (ref 0.00–0.50)

## 2016-05-19 MED ORDER — IOPAMIDOL (ISOVUE-370) INJECTION 76%
100.0000 mL | Freq: Once | INTRAVENOUS | Status: AC | PRN
  Administered 2016-05-19: 100 mL via INTRAVENOUS

## 2016-05-19 NOTE — Progress Notes (Signed)
   Subjective:    Patient ID: Manuel Torres, male    DOB: 08-22-1961, 55 y.o.   MRN: [id number]  HPI Patient in today for weakness to legs and hips. Onset 9 months ago.  Patient had surgery laminectomy in second week of June. Fluticasone [facility] for a reunion. [place] their striking his tailbone and his back. His had some weakness in his legs having to use a walker. Apparently when he came from home from the hospital he was not having use a walker. Has c/o blood tinged mucus when coughing. Onset a few after surgery June 8th. Patient states 45 times over the past few weeks he's coughed up sputum that had what appeared to be red tinged blood in it. Denies rectal bleeding. Is not on any type of blood thinner other than the 81 mg aspirin.  Review of Systems Patient relates some weakness in the legs denies loss of bowel or bladder control. Relates some pain in the lower back.    Objective:   Physical Exam Lungs are clear no crackles heart regular no murmurs pulse normal extremities no edema patient not respiratory distress positive weakness in the quadriceps on the left side 4/ 5. Also some weakness in raising the extensor great toe on each side 4/5       Assessment & Plan:  Weakness in the legs-we will do x-rays of lower back along with lab work. Patient may need referral back to Dr. Sherley Bounds may need MRI of the lumbar spine in addition to that patient should do physical therapy.  Hemoptysis-d-dimer chest x-ray ordered I don't hear any pneumonia need to rule out possibility of a pulmonary embolus patient not in any respiratory distress currently

## 2016-05-19 NOTE — ED Provider Notes (Signed)
CSN: [id number]     Arrival date & time 05/19/16  1938 History   First MD Initiated Contact with Patient 05/19/16 1951     Chief Complaint  Patient presents with  . Abnormal Lab      HPI Patient presents with hemoptysis. He had back surgery around a month ago. Around a week after that began to cough up some blood. States he's had phlegm mixed with blood. No chest pain. No trouble breathing. No lightheadedness. No other bleeding. States he had a slight sore throat. He got seen by his primary care doctor, Dr. looking, had an outpatient x-ray and d-dimer. The d-dimer is positive descended for further evaluation. No swelling in his legs. He states his grandmother did die of a blood clot.    Past Medical History  Diagnosis Date  . Hypertension   . Hypercholesteremia   . Gout     NO RECENT FLARE UPS  . Arthritis     Hip  . Gout    Past Surgical History  Procedure Laterality Date  . Right inguinal hernia    . Colonoscopy  08/03/2012    Procedure: COLONOSCOPY;  Surgeon: Rogene Houston, MD;  Location: [facility];  Service: Endoscopy;  Laterality: N/A;  830  . Total hip arthroplasty  10/23/2012    Procedure: TOTAL HIP ARTHROPLASTY;  Surgeon: Gearlean Alf, MD;  Location: [facility];  Service: Orthopedics;  Laterality: Right;  . Joint replacement      r hip Dr. Maureen Ralphs Dec 2013   Family History  Problem Relation Age of Onset  . Colon cancer Other   . Diabetes Father   . Heart disease Father   . Hyperlipidemia Father    Social History  Substance Use Topics  . Smoking status: Never Smoker   . Smokeless tobacco: Never Used  . Alcohol Use: Yes     Comment: occasionally    Review of Systems  Constitutional: Negative for fever, appetite change and fatigue.  HENT: Positive for sore throat. Negative for nosebleeds and trouble swallowing.   Respiratory: Negative for choking and shortness of breath.        Hemoptysis  Cardiovascular: Negative for chest pain.  Gastrointestinal: Negative  for abdominal pain.  Musculoskeletal: Positive for back pain.  Skin: Negative for wound.  Neurological: Positive for weakness.      Allergies  Robaxin  Home Medications   Prior to Admission medications   Medication Sig Start Date End Date Taking? Authorizing Provider  allopurinol (ZYLOPRIM) 300 MG tablet Take 1 tablet (300 mg total) by mouth daily. 03/12/16   Kathyrn Drown, MD  aspirin 81 MG tablet Take 81 mg by mouth daily.    Historical Provider, MD  atorvastatin (LIPITOR) 20 MG tablet take 1 tablet by mouth once daily 05/19/16   Kathyrn Drown, MD  hydrochlorothiazide (HYDRODIURIL) 25 MG tablet take 1 tablet once daily 05/19/16   Kathyrn Drown, MD  HYDROcodone-acetaminophen (NORCO) 7.5-325 MG tablet Take 1 tablet by mouth every 6 (six) hours as needed. Patient not taking: Reported on 05/19/2016 11/11/15   Kathyrn Drown, MD  lisinopril (PRINIVIL,ZESTRIL) 5 MG tablet take 1 tablet once daily 04/23/16   Kathyrn Drown, MD  meloxicam (MOBIC) 15 MG tablet Take 1 tablet (15 mg total) by mouth daily. Patient not taking: Reported on 05/19/2016 03/12/16   Kathyrn Drown, MD  metFORMIN (GLUCOPHAGE) 500 MG tablet Take 0.5 tablets (250 mg total) by mouth 2 (two) times daily with a  meal. Patient not taking: Reported on 04/02/2016 01/24/15   Kathyrn Drown, MD  oxyCODONE-acetaminophen (PERCOCET/ROXICET) 5-325 MG tablet take 1-2 tablet by mouth every 6 hours if needed 05/04/16   Historical Provider, MD  potassium chloride SA (K-DUR,KLOR-CON) 20 MEQ tablet Take 1 tablet (20 mEq total) by mouth every morning. 03/12/16   Kathyrn Drown, MD  potassium chloride SA (K-DUR,KLOR-CON) 20 MEQ tablet take 1 tablet by mouth every morning 04/20/16   Mikey Kirschner, MD   BP 135/76 mmHg  Pulse 82  Temp(Src) 98.9 F (37.2 C) (Oral)  Resp 21  Ht 5\' 11"  (1.803 m)  Wt 265 lb (120.203 kg)  BMI 36.98 kg/m2  SpO2 97% Physical Exam  Constitutional: He appears well-developed.  HENT:  Head: Atraumatic.  Neck: Neck supple.   Cardiovascular: Normal rate.   Pulmonary/Chest: Effort normal.  Abdominal: There is no tenderness.  Musculoskeletal: He exhibits no edema.  Neurological: He is alert.  Skin: Skin is warm. No erythema.    ED Course  Procedures (including critical care time) Labs Review Labs Reviewed - No data to display  Imaging Review Dg Chest 2 View  05/19/2016  CLINICAL DATA:  Hemoptysis for several weeks. EXAM: CHEST  2 VIEW COMPARISON:  10/18/2012 FINDINGS: The heart size and mediastinal contours are within normal limits. Both lungs are clear. No evidence of pneumothorax or pleural effusion. Thoracic spine degenerative changes again noted. IMPRESSION: No active cardiopulmonary disease. Electronically Signed   By: Earle Gell M.D.   On: 05/19/2016 16:51   Dg Lumbar Spine Complete  05/19/2016  CLINICAL DATA:  History of lumbar spine surgery 04/22/2016. Bilateral lower extremity weakness. No known injury. EXAM: LUMBAR SPINE - COMPLETE 4+ VIEW COMPARISON:  MRI lumbar spine 11/26/2015. Plain films lumbar spine 10/29/2015. FINDINGS: Vertebral body height and alignment are maintained. Multilevel loss of disc space height with endplate spurring is identified. Facet degenerative disease appears worst at L4-5 and L5-S1. Right hip replacement is partially visualized. Imaged paraspinous structures are unremarkable. IMPRESSION: No acute abnormality. No marked change in the appearance of multilevel lumbar spondylosis. Electronically Signed   By: Inge Rise M.D.   On: 05/19/2016 16:51   Ct Angio Chest Pe W/cm &/or Wo Cm  05/19/2016  CLINICAL DATA:  Elevated D-dimer. Hemoptysis. Back surgery 04/22/2016. EXAM: CT ANGIOGRAPHY CHEST WITH CONTRAST TECHNIQUE: Multidetector CT imaging of the chest was performed using the standard protocol during bolus administration of intravenous contrast. Multiplanar CT image reconstructions and MIPs were obtained to evaluate the vascular anatomy. CONTRAST:  100 cc Isovue 370 COMPARISON:   Chest radiography same day FINDINGS: Pulmonary arterial opacification is good.  No pulmonary emboli. Mild aortic atherosclerosis. No aneurysm or dissection. No visible coronary calcification. No pericardial effusion. Small right pleural effusion layering dependently. The left lung is clear. There is patchy density at the right base posteriorly consistent with mild atelectasis, probably secondary to the effusion. There chronic degenerative changes of the spine but no fracture. Scans in the upper abdomen do not show any significant finding. Review of the MIP images confirms the above findings. IMPRESSION: No pulmonary emboli. Small right pleural effusion layering dependently. Mild atelectasis at the right lung base secondary to this. The etiology is uncertain. Aortic atherosclerosis.  Minimal. Electronically Signed   By: Nelson Chimes M.D.   On: 05/19/2016 20:53   I have personally reviewed and evaluated these images and lab results as part of my medical decision-making.   EKG Interpretation None      MDM  Final diagnoses:  Hemoptysis    Patient presented for CT angiography of his chest after positive outpatient d-dimer. CT negative for pulmonary embolism. Did have small pleural effusion. Discussed the results with family members and patient. Discharge home.    Davonna Belling, MD 05/19/16 2127

## 2016-05-19 NOTE — Discharge Instructions (Signed)
Hemoptysis  Hemoptysis, which means coughing up blood, can be a sign of a minor problem or a serious medical condition. The blood that is coughed up may come from the lungs and airways. Coughed-up blood can also come from bleeding that occurs outside the lungs and airways. Blood can drain into the windpipe during a severe nosebleed or when blood is vomited from the stomach. Because hemoptysis can be a sign of something serious, a medical evaluation is required. For some people with hemoptysis, no definite cause is ever identified.  CAUSES   The most common cause of hemoptysis is bronchitis. Some other common causes include:   · A ruptured blood vessel caused by coughing or an infection.    · A medical condition that causes damage to the large air passageways (bronchiectasis).    · A blood clot in the lungs (pulmonary embolism).    · Pneumonia.    · Tuberculosis.    · Breathing in a small foreign object.    · Cancer.  For some people with hemoptysis, no definite cause is ever identified.    HOME CARE INSTRUCTIONS  · Only take over-the-counter or prescription medicines as directed by your caregiver. Do not use cough suppressants unless your caregiver approves.  · If your caregiver prescribes antibiotic medicines, take them as directed. Finish them even if you start to feel better.  · Do not smoke. Also avoid secondhand smoke.  · Follow up with your caregiver as directed.  SEEK IMMEDIATE MEDICAL CARE IF:   · You cough up bloody mucus for longer than a week.  · You have a blood-producing cough that is severe or getting worse.  · You have a blood-producing cough that comes and goes over time.  · You develop problems with your breathing.    · You vomit blood.  · You develop bloody or black-colored stools.  · You have chest pain.    · You develop night sweats.  · You feel faint or pass out.    · You have a fever or persistent symptoms for more than 2-3 days.    · You have a fever and your symptoms suddenly get worse.  MAKE  SURE YOU:  · Understand these instructions.  · Will watch your condition.  · Will get help right away if you are not doing well or get worse.     This information is not intended to replace advice given to you by your health care provider. Make sure you discuss any questions you have with your health care provider.     Document Released: 01/10/2002 Document Revised: 10/18/2012 Document Reviewed: 08/18/2012  Elsevier Interactive Patient Education ©2016 [employer]

## 2016-05-19 NOTE — ED Notes (Signed)
Pt was seen by pcp and had elevated d-dimer. Pt states he has been coughing up blood since back surgery.

## 2016-05-20 ENCOUNTER — Other Ambulatory Visit: Payer: Self-pay

## 2016-05-20 ENCOUNTER — Ambulatory Visit (HOSPITAL_COMMUNITY)
Admission: RE | Admit: 2016-05-20 | Discharge: 2016-05-20 | Disposition: A | Discharge status: Home / Self Care | Payer: 59 | Source: Ambulatory Visit | Attending: Family Medicine | Admitting: Family Medicine

## 2016-05-20 DIAGNOSIS — R29898 Other symptoms and signs involving the musculoskeletal system: Secondary | ICD-10-CM | POA: Diagnosis present

## 2016-05-20 DIAGNOSIS — R7989 Other specified abnormal findings of blood chemistry: Secondary | ICD-10-CM

## 2016-05-20 DIAGNOSIS — R791 Abnormal coagulation profile: Secondary | ICD-10-CM | POA: Insufficient documentation

## 2016-05-27 ENCOUNTER — Other Ambulatory Visit: Payer: Self-pay | Admitting: Neurological Surgery

## 2016-05-27 DIAGNOSIS — M48061 Spinal stenosis, lumbar region without neurogenic claudication: Secondary | ICD-10-CM

## 2016-05-27 LAB — FERRITIN: FERRITIN: 429 ng/mL — AB (ref 30–400)

## 2016-05-27 LAB — HEPATITIS C ANTIBODY

## 2016-05-27 LAB — IRON AND TIBC
Iron Saturation: 43 % (ref 15–55)
Iron: 118 ug/dL (ref 38–169)
TIBC: 273 ug/dL (ref 250–450)
UIBC: 155 ug/dL (ref 111–343)

## 2016-05-27 LAB — CERULOPLASMIN: CERULOPLASMIN: 33.9 mg/dL — AB (ref 16.0–31.0)

## 2016-05-27 LAB — HEPATITIS B SURFACE ANTIGEN: HEP B S AG: NEGATIVE

## 2016-05-27 LAB — ANA: Anti Nuclear Antibody(ANA): POSITIVE — AB

## 2016-05-27 LAB — SEDIMENTATION RATE: SED RATE: 19 mm/h (ref 0–30)

## 2016-05-30 ENCOUNTER — Ambulatory Visit
Admission: RE | Admit: 2016-05-30 | Discharge: 2016-05-30 | Disposition: A | Discharge status: Home / Self Care | Payer: 59 | Source: Ambulatory Visit | Attending: Neurological Surgery | Admitting: Neurological Surgery

## 2016-05-30 DIAGNOSIS — M48061 Spinal stenosis, lumbar region without neurogenic claudication: Secondary | ICD-10-CM

## 2016-05-30 MED ORDER — GADOBENATE DIMEGLUMINE 529 MG/ML IV SOLN
20.0000 mL | Freq: Once | INTRAVENOUS | Status: AC | PRN
  Administered 2016-05-30: 20 mL via INTRAVENOUS

## 2016-05-31 ENCOUNTER — Other Ambulatory Visit: Payer: Self-pay

## 2016-05-31 DIAGNOSIS — R748 Abnormal levels of other serum enzymes: Secondary | ICD-10-CM

## 2016-06-02 ENCOUNTER — Encounter (INDEPENDENT_AMBULATORY_CARE_PROVIDER_SITE_OTHER): Payer: Self-pay | Admitting: Internal Medicine

## 2016-06-16 ENCOUNTER — Encounter: Payer: Self-pay | Admitting: Family Medicine

## 2016-06-19 ENCOUNTER — Other Ambulatory Visit: Payer: Self-pay | Admitting: Family Medicine

## 2016-06-21 ENCOUNTER — Ambulatory Visit (INDEPENDENT_AMBULATORY_CARE_PROVIDER_SITE_OTHER): Payer: 59 | Admitting: Internal Medicine

## 2016-06-21 ENCOUNTER — Encounter (INDEPENDENT_AMBULATORY_CARE_PROVIDER_SITE_OTHER): Payer: Self-pay | Admitting: Internal Medicine

## 2016-06-21 VITALS — BP 170/90 | HR 64 | Temp 98.1°F | Ht 71.0 in | Wt 268.4 lb

## 2016-06-21 DIAGNOSIS — K760 Fatty (change of) liver, not elsewhere classified: Secondary | ICD-10-CM

## 2016-06-21 DIAGNOSIS — R748 Abnormal levels of other serum enzymes: Secondary | ICD-10-CM

## 2016-06-21 NOTE — Patient Instructions (Signed)
Labs today. OV in 6 months.  

## 2016-06-21 NOTE — Progress Notes (Addendum)
Subjective:    Patient ID: Manuel Torres, male    DOB: 01/07/1961, 55 y.o.   MRN: [id number]  HPI Referred by Dr. Wolfgang Phoenix for elevated liver enzymes. Hx of same and has seen Dr. Ardis Hughs in the past (2011) for elevated liver enzymes.  Per Dr. Ardis Hughs records, CBC, Hepatitis B surface antigen, Hep C, ceruloplasm, iron studies were normal. Saw Dr. Ardis Hughs x 1.   His appetite is good. No weight loss. BMs about 3 times a week. No melena or BRRB. No abdominal pain.  Rarely drinks etoh.  May drinks beer every other weekend.    05/26/2016 ANA +, sedrate 19, TIBC 273, UIBC 155, Iron 118, Ferritin 429, Ceruloplasmin 33.9.  Hep C antibody negative Hep B surface antiggen negative.  05/20/2016 [place] elast: Corresponding Metavir fibrosis score is some F3 and F4.  Risk of fibrosis is high.  Hepatic Function Latest Ref Rng & Units 04/16/2016 01/24/2015 05/25/2014  Total Protein 6.0 - 8.5 g/dL 7.0 7.5 6.3  Albumin 3.5 - 5.5 g/dL 4.3 4.2 3.6  AST 0 - 40 IU/L 48(H) 62(H) 50(H)  ALT 0 - 44 IU/L 53(H) 57(H) 54(H)  Alk Phosphatase 39 - 117 IU/L 82 82 70  Total Bilirubin 0.0 - 1.2 mg/dL 0.5 0.7 0.6  Bilirubin, Direct 0.00 - 0.40 mg/dL 0.19 0.20 0.1        Review of Systems Past Medical History:  Diagnosis Date  . Arthritis    Hip  . Gout    NO RECENT FLARE UPS  . Gout   . Hypercholesteremia   . Hypertension     Past Surgical History:  Procedure Laterality Date  . BACK SURGERY     April 22, 2016  . COLONOSCOPY  08/03/2012   Procedure: COLONOSCOPY;  Surgeon: Rogene Houston, MD;  Location: [facility];  Service: Endoscopy;  Laterality: N/A;  830  . JOINT REPLACEMENT     r hip Dr. Maureen Ralphs Dec 2013  . right inguinal hernia    . TOTAL HIP ARTHROPLASTY  10/23/2012   Procedure: TOTAL HIP ARTHROPLASTY;  Surgeon: Gearlean Alf, MD;  Location: [facility];  Service: Orthopedics;  Laterality: Right;    Allergies  Allergen Reactions  . Robaxin [Methocarbamol]     hiccups    Current Outpatient  Prescriptions on File Prior to Visit  Medication Sig Dispense Refill  . allopurinol (ZYLOPRIM) 300 MG tablet take 1 tablet once daily 30 tablet 5  . aspirin 81 MG tablet Take 81 mg by mouth daily.    Marland Kitchen atorvastatin (LIPITOR) 20 MG tablet take 1 tablet by mouth once daily 30 tablet 5  . hydrochlorothiazide (HYDRODIURIL) 25 MG tablet take 1 tablet once daily 30 tablet 5  . lisinopril (PRINIVIL,ZESTRIL) 5 MG tablet take 1 tablet once daily 30 tablet 5  . potassium chloride SA (K-DUR,KLOR-CON) 20 MEQ tablet Take 1 tablet (20 mEq total) by mouth every morning. 30 tablet 0  . metFORMIN (GLUCOPHAGE) 500 MG tablet Take 0.5 tablets (250 mg total) by mouth 2 (two) times daily with a meal. (Patient not taking: Reported on 04/02/2016) 30 tablet 5   No current facility-administered medications on file prior to visit.        Objective:   Physical Exam Blood pressure (!) 170/90, pulse 64, temperature 98.1 F (36.7 C), height '5\' 11"'  (1.803 m), weight 268 lb 6.4 oz (121.7 kg).  Alert and oriented. Skin warm and dry. Oral mucosa is moist.   . Sclera anicteric, conjunctivae is pink.  Thyroid not enlarged. No cervical lymphadenopathy. Lungs clear. Heart regular rate and rhythm.  Abdomen is soft. Bowel sounds are positive. No hepatomegaly. No abdominal masses felt. No tenderness.  No edema to lower extremities. Abdomen is obese.        Assessment & Plan:  Elevated liver enzymes (minimal). Fatty liver. Hepatic function and SMA today. OV in 6 months.

## 2016-06-21 NOTE — Progress Notes (Signed)
Subjective:    Patient ID: Manuel Torres, male    DOB: 1961-02-23, 55 y.o.   MRN: [id number]  HPI Referred by Dr. Wolfgang Phoenix for elevated liver enzymes. Hx of same and has seen Dr. Ardis Hughs in the past (2011) for elevated liver enzymes.  Per Dr. Ardis Hughs records, CBC, Hepatitis B surface antigen, Hep C, ceruloplasm, iron studies were normal.  Has been on cholesterol lower medication for several years. Liver enzymes were normal in 2015.  Appetite is good. No weight loss. Has BM x 2-3 times a week. No melena or BRRB No abdominal pain .  05/26/2016 ANA +, sedrate 19, TIBC 273, UIBC 155, Iron 118, Ferritin 429, Ceruloplasmin 33.9.  Hep C antibody negative Hep B surface antiggen negative.  05/20/2016 [place] elast: Corresponding Metavir fibrosis score is some F3 and F4.  Risk of fibrosis is high.  Hepatic Function Latest Ref Rng & Units 04/16/2016 01/24/2015 05/25/2014  Total Protein 6.0 - 8.5 g/dL 7.0 7.5 6.3  Albumin 3.5 - 5.5 g/dL 4.3 4.2 3.6  AST 0 - 40 IU/L 48(H) 62(H) 50(H)  ALT 0 - 44 IU/L 53(H) 57(H) 54(H)  Alk Phosphatase 39 - 117 IU/L 82 82 70  Total Bilirubin 0.0 - 1.2 mg/dL 0.5 0.7 0.6  Bilirubin, Direct 0.00 - 0.40 mg/dL 0.19 0.20 0.1        Review of Systems Past Medical History:  Diagnosis Date  . Arthritis    Hip  . Gout    NO RECENT FLARE UPS  . Gout   . Hypercholesteremia   . Hypertension     Past Surgical History:  Procedure Laterality Date  . BACK SURGERY     April 22, 2016  . COLONOSCOPY  08/03/2012   Procedure: COLONOSCOPY;  Surgeon: Rogene Houston, MD;  Location: [facility];  Service: Endoscopy;  Laterality: N/A;  830  . JOINT REPLACEMENT     r hip Dr. Maureen Ralphs Dec 2013  . right inguinal hernia    . TOTAL HIP ARTHROPLASTY  10/23/2012   Procedure: TOTAL HIP ARTHROPLASTY;  Surgeon: Gearlean Alf, MD;  Location: [facility];  Service: Orthopedics;  Laterality: Right;    Allergies  Allergen Reactions  . Robaxin [Methocarbamol]     hiccups    Current Outpatient  Prescriptions on File Prior to Visit  Medication Sig Dispense Refill  . allopurinol (ZYLOPRIM) 300 MG tablet take 1 tablet once daily 30 tablet 5  . aspirin 81 MG tablet Take 81 mg by mouth daily.    Marland Kitchen atorvastatin (LIPITOR) 20 MG tablet take 1 tablet by mouth once daily 30 tablet 5  . hydrochlorothiazide (HYDRODIURIL) 25 MG tablet take 1 tablet once daily 30 tablet 5  . lisinopril (PRINIVIL,ZESTRIL) 5 MG tablet take 1 tablet once daily 30 tablet 5  . potassium chloride SA (K-DUR,KLOR-CON) 20 MEQ tablet Take 1 tablet (20 mEq total) by mouth every morning. 30 tablet 0  . metFORMIN (GLUCOPHAGE) 500 MG tablet Take 0.5 tablets (250 mg total) by mouth 2 (two) times daily with a meal. (Patient not taking: Reported on 04/02/2016) 30 tablet 5   No current facility-administered medications on file prior to visit.        Objective:   Physical Exam Blood pressure (!) 170/90, pulse 64, temperature 98.1 F (36.7 C), height '5\' 11"'  (1.803 m), weight 268 lb 6.4 oz (121.7 kg). Alert and oriented. Skin warm and dry. Oral mucosa is moist.   . Sclera anicteric, conjunctivae is pink. Thyroid not enlarged.  No cervical lymphadenopathy. Lungs clear. Heart regular rate and rhythm.  Abdomen is soft. Bowel sounds are positive. No hepatomegaly. No abdominal masses felt. No tenderness.  No edema to lower extremities.          Assessment & Plan:  Elevated liver, enzymes (slightly), fatty liver, Needs to diet and exercise. Will get a SMA and Hepatic function. OV in 6 months.

## 2016-06-22 LAB — HEPATIC FUNCTION PANEL
ALBUMIN: 4.2 g/dL (ref 3.6–5.1)
ALK PHOS: 87 U/L (ref 40–115)
ALT: 56 U/L — ABNORMAL HIGH (ref 9–46)
AST: 48 U/L — ABNORMAL HIGH (ref 10–35)
Bilirubin, Direct: 0.1 mg/dL (ref <=0.2)
Indirect Bilirubin: 0.4 mg/dL (ref 0.2–1.2)
TOTAL PROTEIN: 6.9 g/dL (ref 6.1–8.1)
Total Bilirubin: 0.5 mg/dL (ref 0.2–1.2)

## 2016-06-24 LAB — ANTI-SMOOTH MUSCLE ANTIBODY, IGG

## 2016-07-01 ENCOUNTER — Other Ambulatory Visit (INDEPENDENT_AMBULATORY_CARE_PROVIDER_SITE_OTHER): Payer: Self-pay | Admitting: *Deleted

## 2016-07-01 DIAGNOSIS — R7401 Elevation of levels of liver transaminase levels: Secondary | ICD-10-CM

## 2016-07-01 DIAGNOSIS — R740 Nonspecific elevation of levels of transaminase and lactic acid dehydrogenase [LDH]: Principal | ICD-10-CM

## 2016-07-12 ENCOUNTER — Encounter (INDEPENDENT_AMBULATORY_CARE_PROVIDER_SITE_OTHER): Payer: Self-pay | Admitting: *Deleted

## 2016-07-12 ENCOUNTER — Other Ambulatory Visit (INDEPENDENT_AMBULATORY_CARE_PROVIDER_SITE_OTHER): Payer: Self-pay | Admitting: *Deleted

## 2016-07-12 DIAGNOSIS — R7401 Elevation of levels of liver transaminase levels: Secondary | ICD-10-CM

## 2016-07-12 DIAGNOSIS — R740 Nonspecific elevation of levels of transaminase and lactic acid dehydrogenase [LDH]: Principal | ICD-10-CM

## 2016-07-30 LAB — HEPATIC FUNCTION PANEL
ALK PHOS: 76 U/L (ref 40–115)
ALT: 48 U/L — AB (ref 9–46)
AST: 44 U/L — ABNORMAL HIGH (ref 10–35)
Albumin: 4 g/dL (ref 3.6–5.1)
BILIRUBIN INDIRECT: 0.6 mg/dL (ref 0.2–1.2)
Bilirubin, Direct: 0.2 mg/dL (ref <=0.2)
TOTAL PROTEIN: 7 g/dL (ref 6.1–8.1)
Total Bilirubin: 0.8 mg/dL (ref 0.2–1.2)

## 2016-08-03 ENCOUNTER — Other Ambulatory Visit (INDEPENDENT_AMBULATORY_CARE_PROVIDER_SITE_OTHER): Payer: Self-pay | Admitting: *Deleted

## 2016-08-03 DIAGNOSIS — R7401 Elevation of levels of liver transaminase levels: Secondary | ICD-10-CM

## 2016-08-03 DIAGNOSIS — R740 Nonspecific elevation of levels of transaminase and lactic acid dehydrogenase [LDH]: Principal | ICD-10-CM

## 2016-08-09 ENCOUNTER — Encounter (INDEPENDENT_AMBULATORY_CARE_PROVIDER_SITE_OTHER): Payer: Self-pay | Admitting: *Deleted

## 2016-08-09 ENCOUNTER — Other Ambulatory Visit (INDEPENDENT_AMBULATORY_CARE_PROVIDER_SITE_OTHER): Payer: Self-pay | Admitting: *Deleted

## 2016-08-09 DIAGNOSIS — R7401 Elevation of levels of liver transaminase levels: Secondary | ICD-10-CM

## 2016-08-09 DIAGNOSIS — R740 Nonspecific elevation of levels of transaminase and lactic acid dehydrogenase [LDH]: Principal | ICD-10-CM

## 2016-09-15 LAB — HEPATIC FUNCTION PANEL
ALT: 53 U/L — AB (ref 9–46)
AST: 48 U/L — AB (ref 10–35)
Albumin: 4 g/dL (ref 3.6–5.1)
Alkaline Phosphatase: 83 U/L (ref 40–115)
BILIRUBIN DIRECT: 0.1 mg/dL (ref <=0.2)
BILIRUBIN INDIRECT: 0.5 mg/dL (ref 0.2–1.2)
BILIRUBIN TOTAL: 0.6 mg/dL (ref 0.2–1.2)
Total Protein: 7 g/dL (ref 6.1–8.1)

## 2016-09-16 ENCOUNTER — Telehealth (INDEPENDENT_AMBULATORY_CARE_PROVIDER_SITE_OTHER): Payer: Self-pay | Admitting: Internal Medicine

## 2016-09-16 NOTE — Telephone Encounter (Signed)
Manuel Torres, Hepatic in 3 weeks

## 2016-09-17 ENCOUNTER — Other Ambulatory Visit (INDEPENDENT_AMBULATORY_CARE_PROVIDER_SITE_OTHER): Payer: Self-pay | Admitting: *Deleted

## 2016-09-17 ENCOUNTER — Encounter (INDEPENDENT_AMBULATORY_CARE_PROVIDER_SITE_OTHER): Payer: Self-pay | Admitting: *Deleted

## 2016-09-17 DIAGNOSIS — R748 Abnormal levels of other serum enzymes: Secondary | ICD-10-CM

## 2016-09-17 NOTE — Telephone Encounter (Signed)
This lab has been ordered for 10/08/2016.

## 2016-09-24 DIAGNOSIS — Z0289 Encounter for other administrative examinations: Secondary | ICD-10-CM

## 2016-09-27 ENCOUNTER — Other Ambulatory Visit: Payer: Self-pay | Admitting: Neurological Surgery

## 2016-09-27 DIAGNOSIS — M48062 Spinal stenosis, lumbar region with neurogenic claudication: Secondary | ICD-10-CM

## 2016-10-04 ENCOUNTER — Encounter: Payer: Self-pay | Admitting: Family Medicine

## 2016-10-04 ENCOUNTER — Ambulatory Visit (INDEPENDENT_AMBULATORY_CARE_PROVIDER_SITE_OTHER): Payer: 59 | Admitting: Family Medicine

## 2016-10-04 VITALS — BP 142/86 | Ht 71.0 in | Wt 270.0 lb

## 2016-10-04 DIAGNOSIS — M1A0790 Idiopathic chronic gout, unspecified ankle and foot, without tophus (tophi): Secondary | ICD-10-CM

## 2016-10-04 DIAGNOSIS — I10 Essential (primary) hypertension: Secondary | ICD-10-CM

## 2016-10-04 DIAGNOSIS — E7849 Other hyperlipidemia: Secondary | ICD-10-CM

## 2016-10-04 DIAGNOSIS — E784 Other hyperlipidemia: Secondary | ICD-10-CM

## 2016-10-04 DIAGNOSIS — R7303 Prediabetes: Secondary | ICD-10-CM

## 2016-10-04 DIAGNOSIS — Z23 Encounter for immunization: Secondary | ICD-10-CM

## 2016-10-04 MED ORDER — LISINOPRIL 5 MG PO TABS: 5.0000 mg | ORAL_TABLET | Freq: Every day | ORAL | 5 refills | Status: DC

## 2016-10-04 MED ORDER — HYDROCHLOROTHIAZIDE 25 MG PO TABS: 25.0000 mg | ORAL_TABLET | Freq: Every day | ORAL | 5 refills | Status: DC

## 2016-10-04 MED ORDER — ATORVASTATIN CALCIUM 20 MG PO TABS: 20.0000 mg | ORAL_TABLET | Freq: Every day | ORAL | 5 refills | Status: DC

## 2016-10-04 MED ORDER — ALLOPURINOL 300 MG PO TABS: 300.0000 mg | ORAL_TABLET | Freq: Every day | ORAL | 5 refills | Status: DC

## 2016-10-04 MED ORDER — POTASSIUM CHLORIDE CRYS ER 20 MEQ PO TBCR: 20.0000 meq | EXTENDED_RELEASE_TABLET | Freq: Every morning | ORAL | 5 refills | Status: DC

## 2016-10-04 NOTE — Progress Notes (Signed)
   Subjective:    Patient ID: Manuel Torres, male    DOB: 03-07-1961, 55 y.o.   MRN: [id number]  Hypertension  This is a chronic problem. The current episode started more than 1 year ago. Risk factors for coronary artery disease include dyslipidemia, male gender, obesity and sedentary lifestyle. Treatments tried: lisinopril, hctz.   Seeing specialist about hip issues Currently right now the back specialist is coming doing a MRI of the 25th and he will follow-up a couple days later with the back specialist a few days after that her follow-up with the hips specialist  Patient denies any flareup of gout He does take his cholesterol medicine on a regular basis denies problems with it Takes pain medicine only sparingly Relates he does take his blood pressure medicine on a regular basis In the 81 mg aspirin He does try to watch starches He does suffer with obesity but it's difficult to lose weight when he can exercise He is doing physical therapy.  Review of Systems He denies chest tightness pressure pain shortness breath swelling sweats nausea vomiting diarrhea    Objective:   Physical Exam Lungs clear no crackles heart regular no murmurs pulse normal diabetic foot exam normal       Assessment & Plan:  Hyperlipidemia previous labs reviewed 's ordered continue medication HTN good control continue current medication History of gout previous lab reviewed Bordered continue medication check lab work Prediabetes A1c ordered await the results previous lab work reviewed Morbid obesity the importance of trying to lose weight discussed in detail Follow-up in 6 months

## 2016-10-09 ENCOUNTER — Ambulatory Visit
Admission: RE | Admit: 2016-10-09 | Discharge: 2016-10-09 | Disposition: A | Discharge status: Home / Self Care | Payer: 59 | Source: Ambulatory Visit | Attending: Neurological Surgery | Admitting: Neurological Surgery

## 2016-10-09 DIAGNOSIS — M48062 Spinal stenosis, lumbar region with neurogenic claudication: Secondary | ICD-10-CM

## 2016-10-09 MED ORDER — GADOBENATE DIMEGLUMINE 529 MG/ML IV SOLN
20.0000 mL | Freq: Once | INTRAVENOUS | Status: AC | PRN
  Administered 2016-10-09: 20 mL via INTRAVENOUS

## 2016-10-26 DIAGNOSIS — Z0289 Encounter for other administrative examinations: Secondary | ICD-10-CM

## 2016-12-17 ENCOUNTER — Telehealth (INDEPENDENT_AMBULATORY_CARE_PROVIDER_SITE_OTHER): Payer: Self-pay | Admitting: Internal Medicine

## 2016-12-17 NOTE — Telephone Encounter (Signed)
I called the patient to remind him of his appointment for 12/22/16.  He stated that he went to have labs done back in November, but [facility] was closed and he never went back.  He stated that now he has to [employer] per his insurance company.  He needs the orders sent to [facility] and needs to know if he should move his appointment out a week or so to allow time to get this done and to get the results.  [phone]

## 2016-12-20 ENCOUNTER — Telehealth (INDEPENDENT_AMBULATORY_CARE_PROVIDER_SITE_OTHER): Payer: Self-pay | Admitting: Internal Medicine

## 2016-12-20 DIAGNOSIS — R748 Abnormal levels of other serum enzymes: Secondary | ICD-10-CM

## 2016-12-20 NOTE — Telephone Encounter (Signed)
Hepatic function ordered.  ?

## 2016-12-20 NOTE — Telephone Encounter (Signed)
Hope, he can pick up lab slip and go to [facility]. I will give u the slip.

## 2016-12-20 NOTE — Telephone Encounter (Signed)
I have called the patient and told him he could pick up the lab order.

## 2016-12-22 ENCOUNTER — Ambulatory Visit (INDEPENDENT_AMBULATORY_CARE_PROVIDER_SITE_OTHER): Payer: 59 | Admitting: Internal Medicine

## 2017-01-18 DIAGNOSIS — M961 Postlaminectomy syndrome, not elsewhere classified: Secondary | ICD-10-CM | POA: Diagnosis not present

## 2017-01-24 NOTE — Progress Notes (Signed)
Surgery on 3/21.  Preop on on 3/15.  Need orders in epic.  Thank You.

## 2017-01-25 ENCOUNTER — Ambulatory Visit: Payer: Self-pay | Admitting: Orthopedic Surgery

## 2017-01-25 ENCOUNTER — Encounter: Payer: Self-pay | Admitting: Family Medicine

## 2017-01-25 ENCOUNTER — Ambulatory Visit (INDEPENDENT_AMBULATORY_CARE_PROVIDER_SITE_OTHER): Payer: BLUE CROSS/BLUE SHIELD | Admitting: Family Medicine

## 2017-01-25 VITALS — BP 140/90 | Ht 71.0 in | Wt 277.0 lb

## 2017-01-25 DIAGNOSIS — I10 Essential (primary) hypertension: Secondary | ICD-10-CM | POA: Diagnosis not present

## 2017-01-25 DIAGNOSIS — R7303 Prediabetes: Secondary | ICD-10-CM

## 2017-01-25 DIAGNOSIS — G4700 Insomnia, unspecified: Secondary | ICD-10-CM

## 2017-01-25 DIAGNOSIS — E784 Other hyperlipidemia: Secondary | ICD-10-CM

## 2017-01-25 DIAGNOSIS — M160 Bilateral primary osteoarthritis of hip: Secondary | ICD-10-CM

## 2017-01-25 DIAGNOSIS — M1A0790 Idiopathic chronic gout, unspecified ankle and foot, without tophus (tophi): Secondary | ICD-10-CM

## 2017-01-25 DIAGNOSIS — E7849 Other hyperlipidemia: Secondary | ICD-10-CM

## 2017-01-25 MED ORDER — POTASSIUM CHLORIDE CRYS ER 20 MEQ PO TBCR: 20.0000 meq | EXTENDED_RELEASE_TABLET | Freq: Every morning | ORAL | 1 refills | Status: DC

## 2017-01-25 MED ORDER — LISINOPRIL 10 MG PO TABS: 10.0000 mg | ORAL_TABLET | Freq: Every day | ORAL | 1 refills | Status: DC

## 2017-01-25 MED ORDER — TRAZODONE HCL 50 MG PO TABS: 25.0000 mg | ORAL_TABLET | Freq: Every evening | ORAL | 3 refills | Status: DC | PRN

## 2017-01-25 MED ORDER — HYDROCHLOROTHIAZIDE 25 MG PO TABS: 25.0000 mg | ORAL_TABLET | Freq: Every day | ORAL | 1 refills | Status: DC

## 2017-01-25 MED ORDER — ALLOPURINOL 300 MG PO TABS: 300.0000 mg | ORAL_TABLET | Freq: Every day | ORAL | 1 refills | Status: DC

## 2017-01-25 MED ORDER — ATORVASTATIN CALCIUM 20 MG PO TABS: 20.0000 mg | ORAL_TABLET | Freq: Every day | ORAL | 1 refills | Status: DC

## 2017-01-25 MED ORDER — LISINOPRIL 5 MG PO TABS: 5.0000 mg | ORAL_TABLET | Freq: Every day | ORAL | 5 refills | Status: DC

## 2017-01-25 NOTE — Progress Notes (Signed)
Subjective:    Patient ID: Manuel Torres, male    DOB: 08/30/1961, 56 y.o.   MRN: [id number]  HPI  Patient in today for a surgical clearance for a left hip replacement. This patient has had previous hip surgery on the right side now he is getting a replacement on the left side. He states that it is bone upon bone in they plan on doing the surgery later this month He has had chronic low back pain is had previous surgery his specialists has tried injections without much help currently has him on gabapentin 2-3 times per day and also takes oxycodone 5 mg 1 tablet 2 or 3 times per day he states it does not cause any drowsiness  He states when he does do physical activity at the [facility] as well as at home he has not experienced any type of chest tightness pressure pain or shortness of breath  Denies any swelling in the legs no alarming symptoms such as vomiting or dysphagia shortness of breath rectal bleeding Sugar is he thinks it been under good control watching his diet to some degree his weight has gone up because of inactivity He does have history of blood pressure and cholesterol and tries to be careful with the diet but states she could do better He has been under some stress because his son was murdered over year ago and just recently please made an arrest. He has had some stress relating this but denies being depressed. He has had some problems with insomnia falling asleep staying asleep. Also has concerns of insomnia.    Patient is able to do mild activity at the [facility] and denies any type of chest tightness pressure pain or shortness of breath with it. Review of Systems Please see above. Denies head congestion drainage wheezing denies dysuria urinary frequency    Objective:   Physical Exam  Neck no masses lungs are clear no crackles heart is regular no murmurs pulses normal abdomen soft obese extremities no edema skin warm dry blood pressure taken twice best reading 138/90 and  140/88      Assessment & Plan:  HTN subpar control increase lisinopril 10 mg daily watch diet closely follow-up within 6 months  Prediabetes check A1c watch starches in diet stay more active watch portions try to reduce weight under 250 pounds if possible over the next 6 months  Hyperlipidemia previous labs reviewed continue current lab work continue current medication await the results  Gout no flareups recently taking allopurinol daily continue medication check levels  Chronic back pain follows with specialist patient was cautioned that if he ever felt drowsy not to drive  Patient denies symptoms of sleep apnea questionnaire was filled out he is at mild risk symptoms of sleep apnea as well as what to watch for was discussed with the patient  Patient did fill out questionnaire regarding depression he is not depressed but he is under stress with recent events  Insomnia try trazodone 50 mg 1/2-1 daily at bedtime when necessary to help with sleep sleep/insomnia information sheet given  If ongoing troubles with these areas he needs follow-up otherwise follow-up in 6 months  Patient does in fact have surgical clearance for left hip surgery he was instructed to stop aspirin one week beforehand he was instructed to continue his other medications. In addition to this he will discuss with the orthopedic surgeon regarding his pain medication heading up to surgery. He will also discuss with orthopedics strategies to prevent DVTs.  From a medical/cardiac standpoint patient approved for surgery

## 2017-01-26 ENCOUNTER — Other Ambulatory Visit (HOSPITAL_COMMUNITY): Payer: Self-pay | Admitting: Emergency Medicine

## 2017-01-26 LAB — HEPATIC FUNCTION PANEL
ALK PHOS: 81 IU/L (ref 39–117)
ALT: 49 IU/L — AB (ref 0–44)
AST: 45 IU/L — AB (ref 0–40)
Albumin: 4.4 g/dL (ref 3.5–5.5)
BILIRUBIN, DIRECT: 0.11 mg/dL (ref 0.00–0.40)
Bilirubin Total: 0.4 mg/dL (ref 0.0–1.2)
Total Protein: 7.3 g/dL (ref 6.0–8.5)

## 2017-01-26 LAB — BASIC METABOLIC PANEL
BUN / CREAT RATIO: 10 (ref 9–20)
BUN: 9 mg/dL (ref 6–24)
CO2: 21 mmol/L (ref 18–29)
CREATININE: 0.89 mg/dL (ref 0.76–1.27)
Calcium: 9.7 mg/dL (ref 8.7–10.2)
Chloride: 99 mmol/L (ref 96–106)
GFR, EST AFRICAN AMERICAN: 110 mL/min/{1.73_m2} (ref >59)
GFR, EST NON AFRICAN AMERICAN: 96 mL/min/{1.73_m2} (ref >59)
Glucose: 120 mg/dL — ABNORMAL HIGH (ref 65–99)
Potassium: 4.8 mmol/L (ref 3.5–5.2)
SODIUM: 137 mmol/L (ref 134–144)

## 2017-01-26 LAB — CBC WITH DIFFERENTIAL/PLATELET
Basophils Absolute: 0 10*3/uL (ref 0.0–0.2)
Basos: 0 %
EOS (ABSOLUTE): 0.2 10*3/uL (ref 0.0–0.4)
EOS: 3 %
HEMATOCRIT: 43.3 % (ref 37.5–51.0)
Hemoglobin: 15.4 g/dL (ref 13.0–17.7)
Immature Grans (Abs): 0 10*3/uL (ref 0.0–0.1)
Immature Granulocytes: 0 %
Lymphocytes Absolute: 2.9 10*3/uL (ref 0.7–3.1)
Lymphs: 41 %
MCH: 32.8 pg (ref 26.6–33.0)
MCHC: 35.6 g/dL (ref 31.5–35.7)
MCV: 92 fL (ref 79–97)
MONOCYTES: 8 %
MONOS ABS: 0.6 10*3/uL (ref 0.1–0.9)
NEUTROS PCT: 48 %
Neutrophils Absolute: 3.4 10*3/uL (ref 1.4–7.0)
PLATELETS: 268 10*3/uL (ref 150–379)
RBC: 4.7 x10E6/uL (ref 4.14–5.80)
RDW: 13.9 % (ref 12.3–15.4)
WBC: 7 10*3/uL (ref 3.4–10.8)

## 2017-01-26 LAB — URIC ACID: URIC ACID: 5.2 mg/dL (ref 3.7–8.6)

## 2017-01-26 LAB — LIPID PANEL
CHOL/HDL RATIO: 5.7 ratio — AB (ref 0.0–5.0)
CHOLESTEROL TOTAL: 233 mg/dL — AB (ref 100–199)
HDL: 41 mg/dL (ref >39)
LDL Calculated: 157 mg/dL — ABNORMAL HIGH (ref 0–99)
TRIGLYCERIDES: 174 mg/dL — AB (ref 0–149)
VLDL Cholesterol Cal: 35 mg/dL (ref 5–40)

## 2017-01-26 LAB — HEMOGLOBIN A1C
Est. average glucose Bld gHb Est-mCnc: 143 mg/dL
Hgb A1c MFr Bld: 6.6 % — ABNORMAL HIGH (ref 4.8–5.6)

## 2017-01-26 NOTE — Patient Instructions (Addendum)
Manuel Torres  01/26/2017   Your procedure is scheduled on: 02-02-17  Report to [facility] Main  Entrance take [facility]  elevators to 3rd floor to  [place] at 630AM.  Call this number if you have problems the morning of surgery [phone]   Remember: ONLY 1 PERSON MAY GO WITH YOU TO SHORT STAY TO GET  READY MORNING OF [place].  Do not eat food or drink liquids :After Midnight.     Take these medicines the morning of surgery with A SIP OF WATER: allopurinol(zyloprim), atorvastatin(lipitor), gabapentin(neurontin) as needed, Percocet as needed                               You may not have any metal on your body including hair pins and              piercings  Do not wear jewelry, make-up, lotions, powders or perfumes, deodorant             Do not wear nail polish.  Do not shave  48 hours prior to surgery.              Men may shave face and neck.   Do not bring valuables to the hospital. [place].  Contacts, dentures or bridgework may not be worn into surgery.  Leave suitcase in the car. After surgery it may be brought to your room.              Please read over the following fact sheets you were given: _____________________________________________________________________             [facility] - Preparing for Surgery Before surgery, you can play an important role.  Because skin is not sterile, your skin needs to be as free of germs as possible.  You can reduce the number of germs on your skin by washing with CHG (chlorahexidine gluconate) soap before surgery.  CHG is an antiseptic cleaner which kills germs and bonds with the skin to continue killing germs even after washing. Please DO NOT use if you have an allergy to CHG or antibacterial soaps.  If your skin becomes reddened/irritated stop using the CHG and inform your nurse when you arrive at Short Stay. Do not shave (including legs and  underarms) for at least 48 hours prior to the first CHG shower.  You may shave your face/neck. Please follow these instructions carefully:  1.  Shower with CHG Soap the night before surgery and the  morning of Surgery.  2.  If you choose to wash your hair, wash your hair first as usual with your  normal  shampoo.  3.  After you shampoo, rinse your hair and body thoroughly to remove the  shampoo.                           4.  Use CHG as you would any other liquid soap.  You can apply chg directly  to the skin and wash                       Gently with a scrungie or clean washcloth.  5.  Apply the CHG  Soap to your body ONLY FROM THE NECK DOWN.   Do not use on face/ open                           Wound or open sores. Avoid contact with eyes, ears mouth and genitals (private parts).                       Wash face,  Genitals (private parts) with your normal soap.             6.  Wash thoroughly, paying special attention to the area where your surgery  will be performed.  7.  Thoroughly rinse your body with warm water from the neck down.  8.  DO NOT shower/wash with your normal soap after using and rinsing off  the CHG Soap.                9.  Pat yourself dry with a clean towel.            10.  Wear clean pajamas.            11.  Place clean sheets on your bed the night of your first shower and do not  sleep with pets. Day of Surgery : Do not apply any lotions/deodorants the morning of surgery.  Please wear clean clothes to the hospital/surgery center.  FAILURE TO FOLLOW THESE INSTRUCTIONS MAY RESULT IN THE CANCELLATION OF YOUR SURGERY PATIENT SIGNATURE_________________________________  NURSE SIGNATURE__________________________________  ________________________________________________________________________   Adam Phenix  An incentive spirometer is a tool that can help keep your lungs clear and active. This tool measures how well you are filling your lungs with each breath. Taking  long deep breaths may help reverse or decrease the chance of developing breathing (pulmonary) problems (especially infection) following:  A long period of time when you are unable to move or be active. BEFORE THE PROCEDURE   If the spirometer includes an indicator to show your best effort, your nurse or respiratory therapist will set it to a desired goal.  If possible, sit up straight or lean slightly forward. Try not to slouch.  Hold the incentive spirometer in an upright position. INSTRUCTIONS FOR USE  1. Sit on the edge of your bed if possible, or sit up as far as you can in bed or on a chair. 2. Hold the incentive spirometer in an upright position. 3. Breathe out normally. 4. Place the mouthpiece in your mouth and seal your lips tightly around it. 5. Breathe in slowly and as deeply as possible, raising the piston or the ball toward the top of the column. 6. Hold your breath for 3-5 seconds or for as long as possible. Allow the piston or ball to fall to the bottom of the column. 7. Remove the mouthpiece from your mouth and breathe out normally. 8. Rest for a few seconds and repeat Steps 1 through 7 at least 10 times every 1-2 hours when you are awake. Take your time and take a few normal breaths between deep breaths. 9. The spirometer may include an indicator to show your best effort. Use the indicator as a goal to work toward during each repetition. 10. After each set of 10 deep breaths, practice coughing to be sure your lungs are clear. If you have an incision (the cut made at the time of surgery), support your incision when coughing by placing a pillow or rolled up towels  firmly against it. Once you are able to get out of bed, walk around indoors and cough well. You may stop using the incentive spirometer when instructed by your caregiver.  RISKS AND COMPLICATIONS  Take your time so you do not get dizzy or light-headed.  If you are in pain, you may need to take or ask for pain  medication before doing incentive spirometry. It is harder to take a deep breath if you are having pain. AFTER USE  Rest and breathe slowly and easily.  It can be helpful to keep track of a log of your progress. Your caregiver can provide you with a simple table to help with this. If you are using the spirometer at home, follow these instructions: [place] IF:   You are having difficultly using the spirometer.  You have trouble using the spirometer as often as instructed.  Your pain medication is not giving enough relief while using the spirometer.  You develop fever of 100.5 F (38.1 C) or higher. SEEK IMMEDIATE MEDICAL CARE IF:   You cough up bloody sputum that had not been present before.  You develop fever of 102 F (38.9 C) or greater.  You develop worsening pain at or near the incision site. MAKE SURE YOU:   Understand these instructions.  Will watch your condition.  Will get help right away if you are not doing well or get worse. Document Released: 03/14/2007 Document Revised: 01/24/2012 Document Reviewed: 05/15/2007 ExitCare Patient Information 2014 ExitCare, [place].   ________________________________________________________________________  WHAT IS A BLOOD TRANSFUSION? Blood Transfusion Information  A transfusion is the replacement of blood or some of its parts. Blood is made up of multiple cells which provide different functions.  Red blood cells carry oxygen and are used for blood loss replacement.  White blood cells fight against infection.  Platelets control bleeding.  Plasma helps clot blood.  Other blood products are available for specialized needs, such as hemophilia or other clotting disorders. BEFORE THE TRANSFUSION  Who gives blood for transfusions?   Healthy volunteers who are fully evaluated to make sure their blood is safe. This is blood bank blood. Transfusion therapy is the safest it has ever been in the practice of medicine.  Before blood is taken from a donor, a complete history is taken to make sure that person has no history of diseases nor engages in risky social behavior (examples are intravenous drug use or sexual activity with multiple partners). The donor's travel history is screened to minimize risk of transmitting infections, such as malaria. The donated blood is tested for signs of infectious diseases, such as HIV and hepatitis. The blood is then tested to be sure it is compatible with you in order to minimize the chance of a transfusion reaction. If you or a relative donates blood, this is often done in anticipation of surgery and is not appropriate for emergency situations. It takes many days to process the donated blood. RISKS AND COMPLICATIONS Although transfusion therapy is very safe and saves many lives, the main dangers of transfusion include:   Getting an infectious disease.  Developing a transfusion reaction. This is an allergic reaction to something in the blood you were given. Every precaution is taken to prevent this. The decision to have a blood transfusion has been considered carefully by your caregiver before blood is given. Blood is not given unless the benefits outweigh the risks. AFTER THE TRANSFUSION  Right after receiving a blood transfusion, you will usually feel much better  and more energetic. This is especially true if your red blood cells have gotten low (anemic). The transfusion raises the level of the red blood cells which carry oxygen, and this usually causes an energy increase.  The nurse administering the transfusion will monitor you carefully for complications. HOME CARE INSTRUCTIONS  No special instructions are needed after a transfusion. You may find your energy is better. Speak with your caregiver about any limitations on activity for underlying diseases you may have. SEEK MEDICAL CARE IF:   Your condition is not improving after your transfusion.  You develop redness or  irritation at the intravenous (IV) site. SEEK IMMEDIATE MEDICAL CARE IF:  Any of the following symptoms occur over the next 12 hours:  Shaking chills.  You have a temperature by mouth above 102 F (38.9 C), not controlled by medicine.  Chest, back, or muscle pain.  People around you feel you are not acting correctly or are confused.  Shortness of breath or difficulty breathing.  Dizziness and fainting.  You get a rash or develop hives.  You have a decrease in urine output.  Your urine turns a dark color or changes to pink, red, or brown. Any of the following symptoms occur over the next 10 days:  You have a temperature by mouth above 102 F (38.9 C), not controlled by medicine.  Shortness of breath.  Weakness after normal activity.  The white part of the eye turns yellow (jaundice).  You have a decrease in the amount of urine or are urinating less often.  Your urine turns a dark color or changes to pink, red, or brown. Document Released: 10/29/2000 Document Revised: 01/24/2012 Document Reviewed: 06/17/2008 [facility] Patient Information 2014 [place], [place].  _______________________________________________________________________

## 2017-01-26 NOTE — Progress Notes (Signed)
LOV/Medical clearance Dr Wolfgang Phoenix 01-25-17 epic BMP, CBCdiff, hepatic func panel , HgA1C 01-25-17 epic CXR 05-19-16 epic

## 2017-01-27 ENCOUNTER — Encounter (HOSPITAL_COMMUNITY): Payer: Self-pay

## 2017-01-27 ENCOUNTER — Encounter (HOSPITAL_COMMUNITY)
Admission: RE | Admit: 2017-01-27 | Discharge: 2017-01-27 | Disposition: A | Discharge status: Home / Self Care | Payer: BLUE CROSS/BLUE SHIELD | Source: Ambulatory Visit | Attending: Orthopedic Surgery | Admitting: Orthopedic Surgery

## 2017-01-27 ENCOUNTER — Telehealth: Payer: Self-pay | Admitting: Family Medicine

## 2017-01-27 ENCOUNTER — Other Ambulatory Visit: Payer: Self-pay

## 2017-01-27 DIAGNOSIS — M1612 Unilateral primary osteoarthritis, left hip: Secondary | ICD-10-CM | POA: Insufficient documentation

## 2017-01-27 DIAGNOSIS — Z01810 Encounter for preprocedural cardiovascular examination: Secondary | ICD-10-CM | POA: Insufficient documentation

## 2017-01-27 DIAGNOSIS — I10 Essential (primary) hypertension: Secondary | ICD-10-CM | POA: Insufficient documentation

## 2017-01-27 DIAGNOSIS — G4730 Sleep apnea, unspecified: Secondary | ICD-10-CM

## 2017-01-27 DIAGNOSIS — Z01812 Encounter for preprocedural laboratory examination: Secondary | ICD-10-CM | POA: Diagnosis not present

## 2017-01-27 LAB — CBC
HCT: 41.5 % (ref 39.0–52.0)
HEMOGLOBIN: 14.5 g/dL (ref 13.0–17.0)
MCH: 32.2 pg (ref 26.0–34.0)
MCHC: 34.9 g/dL (ref 30.0–36.0)
MCV: 92 fL (ref 78.0–100.0)
Platelets: 271 10*3/uL (ref 150–400)
RBC: 4.51 MIL/uL (ref 4.22–5.81)
RDW: 13.2 % (ref 11.5–15.5)
WBC: 8.4 10*3/uL (ref 4.0–10.5)

## 2017-01-27 LAB — SURGICAL PCR SCREEN
MRSA, PCR: NEGATIVE
STAPHYLOCOCCUS AUREUS: NEGATIVE

## 2017-01-27 LAB — PROTIME-INR
INR: 0.96
PROTHROMBIN TIME: 12.8 s (ref 11.4–15.2)

## 2017-01-27 LAB — APTT: aPTT: 30 seconds (ref 24–36)

## 2017-01-27 NOTE — Progress Notes (Signed)
   01/27/17 1436  OBSTRUCTIVE SLEEP APNEA  Have you ever been diagnosed with sleep apnea through a sleep study? No  Do you snore loudly (loud enough to be heard through closed doors)?  1  Do you often feel tired, fatigued, or sleepy during the daytime (such as falling asleep during driving or talking to someone)? 0  Has anyone observed you stop breathing during your sleep? 0  Do you have, or are you being treated for high blood pressure? 1  BMI more than 35 kg/m2? 1  Age > 50 (1-yes) 1  Neck circumference greater than:Male 16 inches or larger, Male 17inches or larger? 1  Male Gender (Yes=1) 1  Obstructive Sleep Apnea Score 6

## 2017-01-27 NOTE — Telephone Encounter (Signed)
Please let the patient know that his preoperative screening for sleep apnea was elevated. Because of this he needs to have a sleep apnea/sleep study done. Please move forward with referral for this for a split protocol for sleep apnea to be done at sleep lab-let the patient know that sleep apnea can put a person at increased risk of strokes and heart attacks therefore this test is necessary

## 2017-01-28 NOTE — Addendum Note (Signed)
Addended by: Dairl Ponder on: 01/28/2017 09:07 AM   Modules accepted: Orders

## 2017-01-28 NOTE — Addendum Note (Signed)
Addended by: Dairl Ponder on: 01/28/2017 05:18 PM   Modules accepted: Orders

## 2017-01-28 NOTE — Telephone Encounter (Signed)
Patient advised that his preoperative screening for sleep apnea was elevated. Because of this he needs to have a sleep apnea/sleep study done. Please move forward with referral for this for a split protocol for sleep apnea to be done at sleep lab-let the patient know that sleep apnea can put a person at increased risk of strokes and heart attacks therefore this test is necessary. Patient verbalized understanding and referral ordered in EPIC.

## 2017-02-01 ENCOUNTER — Ambulatory Visit: Payer: Self-pay | Admitting: Orthopedic Surgery

## 2017-02-01 MED ORDER — DEXTROSE 5 % IV SOLN
3.0000 g | INTRAVENOUS | Status: AC
  Administered 2017-02-02: 3 g via INTRAVENOUS
  Filled 2017-02-01: qty 3

## 2017-02-01 NOTE — H&P (Signed)
Manuel Torres DOB: 10/21/1961 Married / Language: English / Race: Black or African American Male Date of Admission:  02/02/2017 CC:  Left Hip pain History of Present Illness  The patient is a 56 year old male who comes in  for a preoperative History and Physical. The patient is scheduled for a left total hip arthroplasty (anterior) to be performed by Dr. Dione Plover. Aluisio, MD at [facility] on 02-02-2017. The patient is a 56 year old male who presented for follow up of their hip. The patient is being followed for their left hip pain and osteoarthritis. Symptoms reported include: pain, pain with weightbearing, difficulty ambulating and difficulty arising from chair. The patient feels that they are doing poorly and report their pain level to be 9 / 10. The following medication has been used for pain control: Percocet. The patient had a previous intra articular hip injection. He states that this injection did not help any. He has had progressively worsening hip pain over the past six months to a year. He has had known arthritis in the hip. I replaced his right hip several years ago and at that point we really noted he had arthritis. He is hurting at all times. It is limiting what he can and cannot too. The intraarticular injection provided minimal benefit. He is now at a point where he would like to proceed with surgery at this time. They have been treated conservatively in the past for the above stated problem and despite conservative measures, they continue to have progressive pain and severe functional limitations and dysfunction. They have failed non-operative management including home exercise, medications, and injections. It is felt that they would benefit from undergoing total joint replacement. Risks and benefits of the procedure have been discussed with the patient and they elect to proceed with surgery. There are no active contraindications to surgery such as ongoing infection or rapidly  progressive neurological disease.  Problem List/Past Medical  Lumbar spinal stenosis (M48.07)  Primary osteoarthritis of left hip (M16.12)  Degenerative lumbar disc (M51.36)  Trochanteric bursitis of left hip (M70.62)  Hypercholesterolemia  Chronic Pain  Gout  High blood pressure  Fatty Liver Disease   Allergies  Robaxin *MUSCULOSKELETAL THERAPY AGENTS*  intractable hiccups  Family History Diabetes Mellitus  Mother. mother Heart Disease  father and grandmother fathers side Heart disease in male family member before age 72  Cancer  grandmother mothers side Cerebrovascular Accident  father Hypertension  mother, father and grandmother fathers side  Social History  Number of flights of stairs before winded  1 Tobacco / smoke exposure  yes Exercise  Exercises rarely; does running / walking Pain Contract  no Children  1 Alcohol use  current drinker; drinks beer and hard liquor; only occasionally per week Previously in rehab  no Drug/Alcohol Rehab (Currently)  no Living situation  live with spouse Tobacco use  Never smoker. never smoker Post-Surgical Plans  Plan is to go home. Marital status  married Current work status  oow since December 3295 Illicit drug use  no Most recent primary occupation  Maintenance  Medication History  Oxycodone-Acetaminophen (5-325MG  Tablet, Oral) Active. Atorvastatin Calcium (20MG  Tablet, Oral) Active. Allopurinol (300MG  Tablet, Oral) Active. Klor-Con M20 [facility] Tablet ER, Oral) Active. Hydrochlorothiazide (25MG  Tablet, Oral) Active. Lisinopril (5MG  Tablet, Oral) Active. Aspirin (81MG  Tablet, Oral) Active. Gabapentin (300MG  Capsule, Oral) Active.  Past Surgical History  Inguinal Hernia Repair  open: right Total Hip Replacement - Right  Date: 2013.   Review of Systems  General Not Present- Chills, Fatigue, Fever, Memory Loss, Night Sweats, Weight Gain and Weight Loss. Skin Not Present- Eczema, Hives,  Itching, Lesions and Rash. HEENT Not Present- Dentures, Double Vision, Headache, Hearing Loss, Tinnitus and Visual Loss. Respiratory Not Present- Allergies, Chronic Cough, Coughing up blood, Shortness of breath at rest and Shortness of breath with exertion. Cardiovascular Not Present- Chest Pain, Difficulty Breathing Lying Down, Murmur, Palpitations, Racing/skipping heartbeats and Swelling. Gastrointestinal Present- Constipation. Not Present- Abdominal Pain, Bloody Stool, Diarrhea, Difficulty Swallowing, Heartburn, Jaundice, Loss of appetitie, Nausea and Vomiting. Male Genitourinary Not Present- Blood in Urine, Discharge, Flank Pain, Incontinence, Painful Urination, Urgency, Urinary frequency, Urinary Retention, Urinating at Night and Weak urinary stream. Musculoskeletal Present- Back Pain, Morning Stiffness and Muscle Weakness. Not Present- Joint Pain, Joint Swelling, Muscle Pain and Spasms. Neurological Not Present- Blackout spells, Difficulty with balance, Dizziness, Paralysis, Tremor and Weakness. Psychiatric Present- Insomnia.  Vitals Weight: 278 lb Height: 71in Body Surface Area: 2.43 m Body Mass Index: 38.77 kg/m  Pulse: 64 (Regular)  BP: 134/86 (Sitting, Left Arm, Standard)    Physical Exam  General Mental Status -Alert, cooperative and good historian. General Appearance-pleasant, Not in acute distress. Orientation-Oriented X3. Build & Nutrition-Well nourished and Well developed.  Head and Neck Head-normocephalic, atraumatic . Neck Global Assessment - supple, no bruit auscultated on the right, no bruit auscultated on the left.  Eye Vision-Wears corrective lenses. Pupil - Bilateral-PERR Motion - Bilateral-EOMI.  Chest and Lung Exam Auscultation Breath sounds - clear at anterior chest wall and clear at posterior chest wall. Adventitious sounds - No Adventitious sounds.  Cardiovascular Auscultation Rhythm - Regular rate and rhythm. Heart Sounds  - S1 WNL and S2 WNL. Murmurs & Other Heart Sounds - Auscultation of the heart reveals - No Murmurs.  Abdomen Inspection Contour - Generalized moderate distention. Palpation/Percussion Tenderness - Abdomen is non-tender to palpation. Rigidity (guarding) - Abdomen is soft. Auscultation Auscultation of the abdomen reveals - Bowel sounds normal.  Male Genitourinary Note: Not done, not pertinent to present illness   Musculoskeletal Note: On exam, he is in no distress. His left hip can be flexed about 90, no internal rotation or external rotation, only about 10 to 20 degrees of abduction. Right hip flexed 110, rotated in 20, out 30, abduct 30 without discomfort.  His radiographs, AP pelvis, lateral of left hip are reviewed. He has got severe end-stage arthritis of the left hip, bone-on-bone with large osteophyte formation.   Assessment & Plan Primary osteoarthritis of left hip (M16.12)  Note:Surgical Plans: Left Total Hip Replacement - Anterior Approach  Disposition: Home, HHPT  PCP: Dr. Wolfgang Phoenix  IV TXA  Patient was instructed on what medications to stop prior to surgery.  Signed electronically by Joelene Millin, III PA-C

## 2017-02-02 ENCOUNTER — Encounter (HOSPITAL_COMMUNITY): Payer: Self-pay | Admitting: *Deleted

## 2017-02-02 ENCOUNTER — Inpatient Hospital Stay (HOSPITAL_COMMUNITY): Payer: BLUE CROSS/BLUE SHIELD | Admitting: Certified Registered Nurse Anesthetist

## 2017-02-02 ENCOUNTER — Encounter (HOSPITAL_COMMUNITY)
Admission: RE | Disposition: A | Discharge status: Home Health | Payer: Self-pay | Source: Ambulatory Visit | Attending: Orthopedic Surgery

## 2017-02-02 ENCOUNTER — Inpatient Hospital Stay (HOSPITAL_COMMUNITY)
Admission: RE | Admit: 2017-02-02 | Discharge: 2017-02-03 | DRG: 470 | Disposition: A | Discharge status: Home Health | Payer: BLUE CROSS/BLUE SHIELD | Source: Ambulatory Visit | Attending: Orthopedic Surgery | Admitting: Orthopedic Surgery

## 2017-02-02 ENCOUNTER — Inpatient Hospital Stay (HOSPITAL_COMMUNITY): Payer: BLUE CROSS/BLUE SHIELD

## 2017-02-02 DIAGNOSIS — I10 Essential (primary) hypertension: Secondary | ICD-10-CM | POA: Diagnosis present

## 2017-02-02 DIAGNOSIS — M109 Gout, unspecified: Secondary | ICD-10-CM | POA: Diagnosis not present

## 2017-02-02 DIAGNOSIS — E7800 Pure hypercholesterolemia, unspecified: Secondary | ICD-10-CM | POA: Diagnosis present

## 2017-02-02 DIAGNOSIS — Z96649 Presence of unspecified artificial hip joint: Secondary | ICD-10-CM

## 2017-02-02 DIAGNOSIS — Z888 Allergy status to other drugs, medicaments and biological substances status: Secondary | ICD-10-CM

## 2017-02-02 DIAGNOSIS — M1612 Unilateral primary osteoarthritis, left hip: Principal | ICD-10-CM | POA: Diagnosis present

## 2017-02-02 DIAGNOSIS — M4807 Spinal stenosis, lumbosacral region: Secondary | ICD-10-CM | POA: Diagnosis not present

## 2017-02-02 DIAGNOSIS — E785 Hyperlipidemia, unspecified: Secondary | ICD-10-CM | POA: Diagnosis not present

## 2017-02-02 DIAGNOSIS — Z96642 Presence of left artificial hip joint: Secondary | ICD-10-CM | POA: Diagnosis not present

## 2017-02-02 DIAGNOSIS — Z79891 Long term (current) use of opiate analgesic: Secondary | ICD-10-CM

## 2017-02-02 DIAGNOSIS — M169 Osteoarthritis of hip, unspecified: Secondary | ICD-10-CM | POA: Diagnosis present

## 2017-02-02 DIAGNOSIS — Z8739 Personal history of other diseases of the musculoskeletal system and connective tissue: Secondary | ICD-10-CM

## 2017-02-02 DIAGNOSIS — Z79899 Other long term (current) drug therapy: Secondary | ICD-10-CM

## 2017-02-02 DIAGNOSIS — Z7982 Long term (current) use of aspirin: Secondary | ICD-10-CM

## 2017-02-02 HISTORY — PX: TOTAL HIP ARTHROPLASTY: SHX124

## 2017-02-02 LAB — COMPREHENSIVE METABOLIC PANEL
ALK PHOS: 63 U/L (ref 38–126)
ALT: 41 U/L (ref 17–63)
AST: 50 U/L — AB (ref 15–41)
Albumin: 3.9 g/dL (ref 3.5–5.0)
Anion gap: 8 (ref 5–15)
BUN: 12 mg/dL (ref 6–20)
CALCIUM: 9.2 mg/dL (ref 8.9–10.3)
CO2: 27 mmol/L (ref 22–32)
Chloride: 100 mmol/L — ABNORMAL LOW (ref 101–111)
Creatinine, Ser: 0.89 mg/dL (ref 0.61–1.24)
GFR calc Af Amer: >60 mL/min (ref >60)
GFR calc non Af Amer: >60 mL/min (ref >60)
GLUCOSE: 148 mg/dL — AB (ref 65–99)
Potassium: 3.7 mmol/L (ref 3.5–5.1)
SODIUM: 135 mmol/L (ref 135–145)
TOTAL PROTEIN: 7.6 g/dL (ref 6.5–8.1)
Total Bilirubin: 0.8 mg/dL (ref 0.3–1.2)

## 2017-02-02 LAB — GLUCOSE, CAPILLARY
GLUCOSE-CAPILLARY: 149 mg/dL — AB (ref 65–99)
Glucose-Capillary: 258 mg/dL — ABNORMAL HIGH (ref 65–99)
Glucose-Capillary: 245 mg/dL — ABNORMAL HIGH (ref 65–99)

## 2017-02-02 LAB — TYPE AND SCREEN
ABO/RH(D): AB POS
ANTIBODY SCREEN: NEGATIVE

## 2017-02-02 SURGERY — ARTHROPLASTY, HIP, TOTAL, ANTERIOR APPROACH
Anesthesia: General | Site: Hip | Laterality: Left

## 2017-02-02 MED ORDER — LACTATED RINGERS IV SOLN
INTRAVENOUS | Status: DC
  Administered 2017-02-02 (×3): via INTRAVENOUS

## 2017-02-02 MED ORDER — ROCURONIUM BROMIDE 50 MG/5ML IV SOSY
PREFILLED_SYRINGE | INTRAVENOUS | Status: AC
  Filled 2017-02-02: qty 5

## 2017-02-02 MED ORDER — MEPERIDINE HCL 50 MG/ML IJ SOLN: 6.2500 mg | INTRAMUSCULAR | Status: DC | PRN

## 2017-02-02 MED ORDER — SUGAMMADEX SODIUM 200 MG/2ML IV SOLN
INTRAVENOUS | Status: AC
  Filled 2017-02-02: qty 2

## 2017-02-02 MED ORDER — ONDANSETRON HCL 4 MG/2ML IJ SOLN: 4.0000 mg | Freq: Four times a day (QID) | INTRAMUSCULAR | Status: DC | PRN

## 2017-02-02 MED ORDER — METOCLOPRAMIDE HCL 5 MG PO TABS: 5.0000 mg | ORAL_TABLET | Freq: Three times a day (TID) | ORAL | Status: DC | PRN

## 2017-02-02 MED ORDER — FLEET ENEMA 7-19 GM/118ML RE ENEM: 1.0000 | ENEMA | Freq: Once | RECTAL | Status: DC | PRN

## 2017-02-02 MED ORDER — RIVAROXABAN 10 MG PO TABS
10.0000 mg | ORAL_TABLET | Freq: Every day | ORAL | Status: DC
  Administered 2017-02-03: 08:00:00 10 mg via ORAL
  Filled 2017-02-02: qty 1

## 2017-02-02 MED ORDER — SUGAMMADEX SODIUM 200 MG/2ML IV SOLN
INTRAVENOUS | Status: DC | PRN
  Administered 2017-02-02: 250 mg via INTRAVENOUS

## 2017-02-02 MED ORDER — SUCCINYLCHOLINE CHLORIDE 200 MG/10ML IV SOSY
PREFILLED_SYRINGE | INTRAVENOUS | Status: AC
  Filled 2017-02-02: qty 10

## 2017-02-02 MED ORDER — HYDROMORPHONE HCL 1 MG/ML IJ SOLN
INTRAMUSCULAR | Status: DC | PRN
  Administered 2017-02-02 (×2): 1 mg via INTRAVENOUS

## 2017-02-02 MED ORDER — TRAMADOL HCL 50 MG PO TABS: 50.0000 mg | ORAL_TABLET | Freq: Four times a day (QID) | ORAL | Status: DC | PRN

## 2017-02-02 MED ORDER — ACETAMINOPHEN 500 MG PO TABS
1000.0000 mg | ORAL_TABLET | Freq: Four times a day (QID) | ORAL | Status: AC
  Administered 2017-02-02 – 2017-02-03 (×4): 1000 mg via ORAL
  Filled 2017-02-02 (×5): qty 2

## 2017-02-02 MED ORDER — FENTANYL CITRATE (PF) 100 MCG/2ML IJ SOLN
INTRAMUSCULAR | Status: AC
  Filled 2017-02-02: qty 2

## 2017-02-02 MED ORDER — HYDROMORPHONE HCL 1 MG/ML IJ SOLN
0.2500 mg | INTRAMUSCULAR | Status: DC | PRN
  Administered 2017-02-02 (×4): 0.5 mg via INTRAVENOUS

## 2017-02-02 MED ORDER — ONDANSETRON HCL 4 MG/2ML IJ SOLN
INTRAMUSCULAR | Status: AC
  Filled 2017-02-02: qty 2

## 2017-02-02 MED ORDER — ROCURONIUM BROMIDE 10 MG/ML (PF) SYRINGE
PREFILLED_SYRINGE | INTRAVENOUS | Status: DC | PRN
  Administered 2017-02-02: 50 mg via INTRAVENOUS
  Administered 2017-02-02 (×2): 10 mg via INTRAVENOUS

## 2017-02-02 MED ORDER — TRANEXAMIC ACID 1000 MG/10ML IV SOLN
1000.0000 mg | Freq: Once | INTRAVENOUS | Status: AC
  Administered 2017-02-02: 15:00:00 1000 mg via INTRAVENOUS
  Filled 2017-02-02: qty 1100

## 2017-02-02 MED ORDER — ACETAMINOPHEN 10 MG/ML IV SOLN
INTRAVENOUS | Status: AC
  Filled 2017-02-02: qty 100

## 2017-02-02 MED ORDER — FENTANYL CITRATE (PF) 250 MCG/5ML IJ SOLN
INTRAMUSCULAR | Status: AC
  Filled 2017-02-02: qty 5

## 2017-02-02 MED ORDER — LABETALOL HCL 5 MG/ML IV SOLN
5.0000 mg | INTRAVENOUS | Status: AC
  Administered 2017-02-02 (×2): 5 mg via INTRAVENOUS

## 2017-02-02 MED ORDER — MIDAZOLAM HCL 5 MG/5ML IJ SOLN
INTRAMUSCULAR | Status: DC | PRN
  Administered 2017-02-02: 2 mg via INTRAVENOUS

## 2017-02-02 MED ORDER — ALLOPURINOL 300 MG PO TABS
300.0000 mg | ORAL_TABLET | Freq: Every day | ORAL | Status: DC
  Administered 2017-02-03: 11:00:00 300 mg via ORAL
  Filled 2017-02-02: qty 1

## 2017-02-02 MED ORDER — DEXAMETHASONE SODIUM PHOSPHATE 10 MG/ML IJ SOLN
10.0000 mg | Freq: Once | INTRAMUSCULAR | Status: AC
  Administered 2017-02-02: 10 mg via INTRAVENOUS

## 2017-02-02 MED ORDER — METOCLOPRAMIDE HCL 5 MG/ML IJ SOLN: 5.0000 mg | Freq: Three times a day (TID) | INTRAMUSCULAR | Status: DC | PRN

## 2017-02-02 MED ORDER — HYDROMORPHONE HCL 2 MG/ML IJ SOLN
INTRAMUSCULAR | Status: AC
  Filled 2017-02-02: qty 1

## 2017-02-02 MED ORDER — ACETAMINOPHEN 325 MG PO TABS: 650.0000 mg | ORAL_TABLET | Freq: Four times a day (QID) | ORAL | Status: DC | PRN

## 2017-02-02 MED ORDER — ONDANSETRON HCL 4 MG/2ML IJ SOLN: 4.0000 mg | Freq: Once | INTRAMUSCULAR | Status: DC | PRN

## 2017-02-02 MED ORDER — MIDAZOLAM HCL 2 MG/2ML IJ SOLN
INTRAMUSCULAR | Status: AC
  Filled 2017-02-02: qty 2

## 2017-02-02 MED ORDER — BISACODYL 10 MG RE SUPP: 10.0000 mg | Freq: Every day | RECTAL | Status: DC | PRN

## 2017-02-02 MED ORDER — GABAPENTIN 300 MG PO CAPS
300.0000 mg | ORAL_CAPSULE | Freq: Three times a day (TID) | ORAL | Status: DC | PRN
  Administered 2017-02-03: 300 mg via ORAL
  Filled 2017-02-02: qty 1

## 2017-02-02 MED ORDER — MORPHINE SULFATE (PF) 4 MG/ML IV SOLN: 1.0000 mg | INTRAVENOUS | Status: DC | PRN

## 2017-02-02 MED ORDER — DEXAMETHASONE SODIUM PHOSPHATE 10 MG/ML IJ SOLN
10.0000 mg | Freq: Once | INTRAMUSCULAR | Status: AC
  Administered 2017-02-03: 08:00:00 10 mg via INTRAVENOUS
  Filled 2017-02-02: qty 1

## 2017-02-02 MED ORDER — PHENYLEPHRINE 40 MCG/ML (10ML) SYRINGE FOR IV PUSH (FOR BLOOD PRESSURE SUPPORT)
PREFILLED_SYRINGE | INTRAVENOUS | Status: DC | PRN
  Administered 2017-02-02 (×3): 80 ug via INTRAVENOUS

## 2017-02-02 MED ORDER — DIPHENHYDRAMINE HCL 12.5 MG/5ML PO ELIX: 12.5000 mg | ORAL_SOLUTION | ORAL | Status: DC | PRN

## 2017-02-02 MED ORDER — SODIUM CHLORIDE 0.9 % IV SOLN: INTRAVENOUS | Status: DC

## 2017-02-02 MED ORDER — HYDROCHLOROTHIAZIDE 25 MG PO TABS
25.0000 mg | ORAL_TABLET | Freq: Every day | ORAL | Status: DC
  Administered 2017-02-03: 11:00:00 25 mg via ORAL
  Filled 2017-02-02: qty 1

## 2017-02-02 MED ORDER — CEFAZOLIN SODIUM-DEXTROSE 2-4 GM/100ML-% IV SOLN
2.0000 g | Freq: Four times a day (QID) | INTRAVENOUS | Status: AC
  Administered 2017-02-02 (×2): 2 g via INTRAVENOUS
  Filled 2017-02-02 (×2): qty 100

## 2017-02-02 MED ORDER — LIDOCAINE 2% (20 MG/ML) 5 ML SYRINGE
INTRAMUSCULAR | Status: DC | PRN
  Administered 2017-02-02: 100 mg via INTRAVENOUS

## 2017-02-02 MED ORDER — POLYETHYLENE GLYCOL 3350 17 G PO PACK: 17.0000 g | PACK | Freq: Every day | ORAL | Status: DC | PRN

## 2017-02-02 MED ORDER — MENTHOL 3 MG MT LOZG
1.0000 | LOZENGE | OROMUCOSAL | Status: DC | PRN
Start: 2017-02-02 — End: 2017-02-03

## 2017-02-02 MED ORDER — OXYCODONE HCL 5 MG PO TABS
5.0000 mg | ORAL_TABLET | ORAL | Status: DC | PRN
  Administered 2017-02-02: 16:00:00 5 mg via ORAL
  Administered 2017-02-02: 10 mg via ORAL
  Administered 2017-02-02: 5 mg via ORAL
  Administered 2017-02-03 (×4): 10 mg via ORAL
  Filled 2017-02-02 (×3): qty 2
  Filled 2017-02-02: qty 1
  Filled 2017-02-02 (×2): qty 2
  Filled 2017-02-02: qty 1

## 2017-02-02 MED ORDER — PROPOFOL 10 MG/ML IV BOLUS
INTRAVENOUS | Status: DC | PRN
  Administered 2017-02-02: 250 mg via INTRAVENOUS
  Administered 2017-02-02: 20 mg via INTRAVENOUS

## 2017-02-02 MED ORDER — ACETAMINOPHEN 10 MG/ML IV SOLN
1000.0000 mg | Freq: Once | INTRAVENOUS | Status: AC
  Administered 2017-02-02: 1000 mg via INTRAVENOUS

## 2017-02-02 MED ORDER — ONDANSETRON HCL 4 MG PO TABS: 4.0000 mg | ORAL_TABLET | Freq: Four times a day (QID) | ORAL | Status: DC | PRN

## 2017-02-02 MED ORDER — ATORVASTATIN CALCIUM 20 MG PO TABS
20.0000 mg | ORAL_TABLET | Freq: Every day | ORAL | Status: DC
  Administered 2017-02-03: 11:00:00 20 mg via ORAL
  Filled 2017-02-02: qty 1

## 2017-02-02 MED ORDER — HYDROMORPHONE HCL 1 MG/ML IJ SOLN
INTRAMUSCULAR | Status: AC
  Filled 2017-02-02: qty 1

## 2017-02-02 MED ORDER — BUPIVACAINE HCL (PF) 0.25 % IJ SOLN
INTRAMUSCULAR | Status: AC
  Filled 2017-02-02: qty 30

## 2017-02-02 MED ORDER — TRANEXAMIC ACID 1000 MG/10ML IV SOLN
1000.0000 mg | INTRAVENOUS | Status: AC
  Administered 2017-02-02: 1000 mg via INTRAVENOUS
  Filled 2017-02-02: qty 1100

## 2017-02-02 MED ORDER — FENTANYL CITRATE (PF) 100 MCG/2ML IJ SOLN
INTRAMUSCULAR | Status: DC | PRN
  Administered 2017-02-02 (×6): 50 ug via INTRAVENOUS

## 2017-02-02 MED ORDER — LABETALOL HCL 5 MG/ML IV SOLN
INTRAVENOUS | Status: AC
  Filled 2017-02-02: qty 4

## 2017-02-02 MED ORDER — ACETAMINOPHEN 650 MG RE SUPP: 650.0000 mg | Freq: Four times a day (QID) | RECTAL | Status: DC | PRN

## 2017-02-02 MED ORDER — TRAZODONE HCL 50 MG PO TABS: 25.0000 mg | ORAL_TABLET | Freq: Every evening | ORAL | Status: DC | PRN

## 2017-02-02 MED ORDER — BUPIVACAINE HCL (PF) 0.25 % IJ SOLN
INTRAMUSCULAR | Status: DC | PRN
  Administered 2017-02-02: 30 mL

## 2017-02-02 MED ORDER — PHENOL 1.4 % MT LIQD: 1.0000 | OROMUCOSAL | Status: DC | PRN

## 2017-02-02 MED ORDER — POTASSIUM CHLORIDE CRYS ER 20 MEQ PO TBCR
20.0000 meq | EXTENDED_RELEASE_TABLET | Freq: Every morning | ORAL | Status: DC
  Administered 2017-02-03: 11:00:00 20 meq via ORAL
  Filled 2017-02-02: qty 1

## 2017-02-02 MED ORDER — ONDANSETRON HCL 4 MG/2ML IJ SOLN
INTRAMUSCULAR | Status: DC | PRN
  Administered 2017-02-02: 4 mg via INTRAVENOUS

## 2017-02-02 MED ORDER — DOCUSATE SODIUM 100 MG PO CAPS
100.0000 mg | ORAL_CAPSULE | Freq: Two times a day (BID) | ORAL | Status: DC
  Administered 2017-02-02 – 2017-02-03 (×2): 100 mg via ORAL
  Filled 2017-02-02 (×2): qty 1

## 2017-02-02 MED ORDER — CHLORHEXIDINE GLUCONATE 4 % EX LIQD: 60.0000 mL | Freq: Once | CUTANEOUS | Status: DC

## 2017-02-02 MED ORDER — LIDOCAINE 2% (20 MG/ML) 5 ML SYRINGE
INTRAMUSCULAR | Status: AC
  Filled 2017-02-02: qty 5

## 2017-02-02 MED ORDER — CYCLOBENZAPRINE HCL 10 MG PO TABS: 10.0000 mg | ORAL_TABLET | Freq: Three times a day (TID) | ORAL | Status: DC | PRN

## 2017-02-02 MED ORDER — BUPIVACAINE HCL (PF) 0.5 % IJ SOLN
INTRAMUSCULAR | Status: AC
  Filled 2017-02-02: qty 30

## 2017-02-02 MED ORDER — DEXAMETHASONE SODIUM PHOSPHATE 10 MG/ML IJ SOLN
INTRAMUSCULAR | Status: AC
  Filled 2017-02-02: qty 1

## 2017-02-02 SURGICAL SUPPLY — 36 items
BAG DECANTER FOR FLEXI CONT (MISCELLANEOUS) ×3 IMPLANT
BAG ZIPLOCK 12X15 (MISCELLANEOUS) IMPLANT
BLADE SAG 18X100X1.27 (BLADE) ×3 IMPLANT
CAPT HIP TOTAL 2 ×3 IMPLANT
CLOSURE WOUND 1/2 X4 (GAUZE/BANDAGES/DRESSINGS) ×1
CLOTH BEACON ORANGE TIMEOUT ST (SAFETY) ×3 IMPLANT
COVER PERINEAL POST (MISCELLANEOUS) ×3 IMPLANT
DECANTER SPIKE VIAL GLASS SM (MISCELLANEOUS) ×3 IMPLANT
DRAPE STERI IOBAN 125X83 (DRAPES) ×3 IMPLANT
DRAPE U-SHAPE 47X51 STRL (DRAPES) ×6 IMPLANT
DRSG ADAPTIC 3X8 NADH LF (GAUZE/BANDAGES/DRESSINGS) ×3 IMPLANT
DRSG MEPILEX BORDER 4X4 (GAUZE/BANDAGES/DRESSINGS) ×3 IMPLANT
DRSG MEPILEX BORDER 4X8 (GAUZE/BANDAGES/DRESSINGS) ×3 IMPLANT
DURAPREP 26ML APPLICATOR (WOUND CARE) ×3 IMPLANT
ELECT REM PT RETURN 9FT ADLT (ELECTROSURGICAL) ×3
ELECTRODE REM PT RTRN 9FT ADLT (ELECTROSURGICAL) ×1 IMPLANT
EVACUATOR 1/8 PVC DRAIN (DRAIN) ×3 IMPLANT
GLOVE BIO SURGEON STRL SZ7.5 (GLOVE) ×3 IMPLANT
GLOVE BIO SURGEON STRL SZ8 (GLOVE) ×6 IMPLANT
GLOVE BIOGEL PI IND STRL 8 (GLOVE) ×2 IMPLANT
GLOVE BIOGEL PI INDICATOR 8 (GLOVE) ×4
GOWN STRL REUS W/TWL LRG LVL3 (GOWN DISPOSABLE) ×3 IMPLANT
GOWN STRL REUS W/TWL XL LVL3 (GOWN DISPOSABLE) ×3 IMPLANT
NS IRRIG 1000ML POUR BTL (IV SOLUTION) ×3 IMPLANT
PACK ANTERIOR HIP CUSTOM (KITS) ×3 IMPLANT
STRIP CLOSURE SKIN 1/2X4 (GAUZE/BANDAGES/DRESSINGS) ×2 IMPLANT
SUT ETHIBOND NAB CT1 #1 30IN (SUTURE) ×3 IMPLANT
SUT MNCRL AB 4-0 PS2 18 (SUTURE) ×3 IMPLANT
SUT STRATAFIX 0 PDS 27 VIOLET (SUTURE) ×6
SUT VIC AB 2-0 CT1 27 (SUTURE) ×6
SUT VIC AB 2-0 CT1 TAPERPNT 27 (SUTURE) ×3 IMPLANT
SUTURE STRATFX 0 PDS 27 VIOLET (SUTURE) ×2 IMPLANT
SYR 50ML LL SCALE MARK (SYRINGE) IMPLANT
TRAY FOLEY W/METER SILVER 16FR (SET/KITS/TRAYS/PACK) IMPLANT
WATER STERILE IRR 1000ML POUR (IV SOLUTION) ×6 IMPLANT
YANKAUER SUCT BULB TIP 10FT TU (MISCELLANEOUS) ×3 IMPLANT

## 2017-02-02 NOTE — Anesthesia Preprocedure Evaluation (Signed)
Anesthesia Evaluation  Patient identified by MRN, date of birth, ID band Patient awake    Reviewed: Allergy & Precautions, NPO status , Patient's Chart, lab work & pertinent test results  Airway Mallampati: I  TM Distance: >3 FB Neck ROM: Full    Dental   Pulmonary    Pulmonary exam normal        Cardiovascular hypertension, Pt. on medications Normal cardiovascular exam     Neuro/Psych    GI/Hepatic   Endo/Other    Renal/GU      Musculoskeletal   Abdominal   Peds  Hematology   Anesthesia Other Findings   Reproductive/Obstetrics                             Anesthesia Physical Anesthesia Plan  ASA: II  Anesthesia Plan: Spinal and MAC   Post-op Pain Management:    Induction: Intravenous  Airway Management Planned: Simple Face Mask  Additional Equipment:   Intra-op Plan:   Post-operative Plan:   Informed Consent: I have reviewed the patients History and Physical, chart, labs and discussed the procedure including the risks, benefits and alternatives for the proposed anesthesia with the patient or authorized representative who has indicated his/her understanding and acceptance.     Plan Discussed with: CRNA and Surgeon  Anesthesia Plan Comments:         Anesthesia Quick Evaluation

## 2017-02-02 NOTE — Transfer of Care (Signed)
Immediate Anesthesia Transfer of Care Note  Patient: Manuel Torres  Procedure(s) Performed: Procedure(s): LEFT TOTAL HIP ARTHROPLASTY ANTERIOR APPROACH (Left)  Patient Location: PACU  Anesthesia Type:General  Level of Consciousness:  sedated, patient cooperative and responds to stimulation  Airway & Oxygen Therapy:Patient Spontanous Breathing and Patient connected to face mask oxgen  Post-op Assessment:  Report given to PACU RN and Post -op Vital signs reviewed and stable  Post vital signs:  Reviewed and stable  Last Vitals:  Vitals:   02/02/17 0639  BP: (!) 144/94  Pulse: 66  Resp: 18  Temp: 37 C    Complications: No apparent anesthesia complications

## 2017-02-02 NOTE — Op Note (Signed)
OPERATIVE REPORT- TOTAL HIP ARTHROPLASTY   PREOPERATIVE DIAGNOSIS: Osteoarthritis of the Left hip.   POSTOPERATIVE DIAGNOSIS: Osteoarthritis of the Left  hip.   PROCEDURE: Left total hip arthroplasty, anterior approach.   SURGEON: Gaynelle Arabian, MD   ASSISTANT: Arlee Muslim, PA-C  ANESTHESIA:  General  ESTIMATED BLOOD LOSS:-700 ml   DRAINS: Hemovac x1.   COMPLICATIONS: None   CONDITION: PACU - hemodynamically stable.   BRIEF CLINICAL NOTE: Manuel Torres is a 56 y.o. male who has advanced end-  stage arthritis of their Left  hip with progressively worsening pain and  dysfunction.The patient has failed nonoperative management and presents for  total hip arthroplasty.   PROCEDURE IN DETAIL: After successful administration of spinal  anesthetic, the traction boots for the [facility] bed were placed on both  feet and the patient was placed onto the [facility] bed, boots placed into the leg  holders. The Left hip was then isolated from the perineum with plastic  drapes and prepped and draped in the usual sterile fashion. ASIS and  greater trochanter were marked and a oblique incision was made, starting  at about 1 cm lateral and 2 cm distal to the ASIS and coursing towards  the anterior cortex of the femur. The skin was cut with a 10 blade  through subcutaneous tissue to the level of the fascia overlying the  tensor fascia lata muscle. The fascia was then incised in line with the  incision at the junction of the anterior third and posterior 2/3rd. The  muscle was teased off the fascia and then the interval between the TFL  and the rectus was developed. The Hohmann retractor was then placed at  the top of the femoral neck over the capsule. The vessels overlying the  capsule were cauterized and the fat on top of the capsule was removed.  A Hohmann retractor was then placed anterior underneath the rectus  femoris to give exposure to the entire anterior capsule. A T-shaped   capsulotomy was performed. The edges were tagged and the femoral head  was identified.       Osteophytes are removed off the superior acetabulum.  The femoral neck was then cut in situ with an oscillating saw. Traction  was then applied to the left lower extremity utilizing the [facility]  traction. The femoral head was then removed. Retractors were placed  around the acetabulum and then circumferential removal of the labrum was  performed. Osteophytes were also removed. Reaming starts at 47 mm to  medialize and  Increased in 2 mm increments to 51 mm. We reamed in  approximately 40 degrees of abduction, 20 degrees anteversion. A 52 mm  pinnacle acetabular shell was then impacted in anatomic position under  fluoroscopic guidance with excellent purchase. We did not need to place  any additional dome screws. A 32 mm neutral + 4 marathon liner was then  placed into the acetabular shell.       The femoral lift was then placed along the lateral aspect of the femur  just distal to the vastus ridge. The leg was  externally rotated and capsule  was stripped off the inferior aspect of the femoral neck down to the  level of the lesser trochanter, this was done with electrocautery. The femur was lifted after this was performed. The  leg was then placed in an extended and adducted position essentially delivering the femur. We also removed the capsule superiorly and the piriformis from the piriformis fossa  to gain excellent exposure of the  proximal femur. Rongeur was used to remove some cancellous bone to get  into the lateral portion of the proximal femur for placement of the  initial starter reamer. The starter broaches was placed  the starter broach  and was shown to go down the center of the canal. Broaching  with the  Corail system was then performed starting at size 8, coursing  Up to size 12. A size 12 had excellent torsional and rotational  and axial stability. The trial high offset neck was then  placed  with a 32 + 1 trial head. The hip was then reduced. We confirmed that  the stem was in the canal both on AP and lateral x-rays. It also has excellent sizing. The hip was reduced with outstanding stability through full extension and full external rotation.. AP pelvis was taken and the leg lengths were measured and found to be equal. Hip was then dislocated again and the femoral head and neck removed. The  femoral broach was removed. Size 12 Corail stem with a highoffset  neck was then impacted into the femur following native anteversion. Has  excellent purchase in the canal. Excellent torsional and rotational and  axial stability. It is confirmed to be in the canal on AP and lateral  fluoroscopic views. The 32 + 1 ceramic head was placed and the hip  reduced with outstanding stability. Again AP pelvis was taken and it  confirmed that the leg lengths were equal. The wound was then copiously  irrigated with saline solution and the capsule reattached and repaired  with Ethibond suture. 30 ml of .25% Bupivicaine was  injected into the capsule and into the edge of the tensor fascia lata as well as subcutaneous tissue. The fascia overlying the tensor fascia lata was then closed with a running #1 V-Loc. Subcu was closed with interrupted 2-0 Vicryl and subcuticular running 4-0 Monocryl. Incision was cleaned  and dried. Steri-Strips and a bulky sterile dressing applied. Hemovac  drain was hooked to suction and then the patient was awakened and transported to  recovery in stable condition.        Please note that a surgical assistant was a medical necessity for this procedure to perform it in a safe and expeditious manner. Assistant was necessary to provide appropriate retraction of vital neurovascular structures and to prevent femoral fracture and allow for anatomic placement of the prosthesis.  Gaynelle Arabian, M.D.

## 2017-02-02 NOTE — Evaluation (Signed)
Physical Therapy Evaluation Patient Details Name: [name] MRN: [id number] DOB: 07-11-1961 Today's Date: 02/02/2017   History of Present Illness  Pt s/p L THR and with hx of R THR (posterior)  Clinical Impression  Pt s/p L THR and presents with decreased L LE strength/ROM and post op pain limiting functional mobility.  Pt should progress to dc home with family assist.    Follow Up Recommendations Home health PT    Equipment Recommendations  None recommended by PT    Recommendations for Other Services       Precautions / Restrictions Precautions Precautions: Fall Restrictions Weight Bearing Restrictions: No Other Position/Activity Restrictions: WBAT      Mobility  Bed Mobility Overal bed mobility: Needs Assistance Bed Mobility: Supine to Sit     Supine to sit: Mod assist;+2 for physical assistance     General bed mobility comments: cues for sequence and use of R LE to self assist.  Physical assist to manage L  LE and to bring trunk to upright  Transfers Overall transfer level: Needs assistance Equipment used: Rolling walker (2 wheeled) Transfers: Sit to/from Stand Sit to Stand: Min assist;Mod assist;+2 physical assistance;From elevated surface         General transfer comment: cues for LE management and use of UEs to self assist  Ambulation/Gait Ambulation/Gait assistance: Min assist;+2 safety/equipment Ambulation Distance (Feet): 38 Feet Assistive device: Rolling walker (2 wheeled) Gait Pattern/deviations: Step-to pattern;Decreased step length - right;Decreased step length - left;Shuffle;Trunk flexed Gait velocity: decr Gait velocity interpretation: Below normal speed for age/gender General Gait Details: cues for sequence, posture and position from [employer]            Wheelchair Mobility    Modified Rankin (Stroke Patients Only)       Balance Overall balance assessment: Needs assistance Sitting-balance support: No upper extremity  supported;Feet supported Sitting balance-Leahy Scale: Good     Standing balance support: Bilateral upper extremity supported Standing balance-Leahy Scale: Fair                               Pertinent Vitals/Pain Pain Assessment: 0-10 Pain Score: 6  Pain Location: L hip Pain Descriptors / Indicators: Aching;Sore Pain Intervention(s): Limited activity within patient's tolerance;Monitored during session;Premedicated before session;Ice applied    Home Living Family/patient expects to be discharged to:: Private residence Living Arrangements: Spouse/significant other Available Help at Discharge: Family Type of Home: House Home Access: Stairs to enter Entrance Stairs-Rails: None Entrance Stairs-Number of Steps: 2 Home Layout: One level Home Equipment: Environmental consultant - 2 wheels;Cane - single point      Prior Function Level of Independence: Independent with assistive device(s)         Comments: used cane and RW as needed     Hand Dominance        Extremity/Trunk Assessment   Upper Extremity Assessment Upper Extremity Assessment: Overall WFL for tasks assessed    Lower Extremity Assessment Lower Extremity Assessment: LLE deficits/detail       Communication   Communication: No difficulties  Cognition Arousal/Alertness: Awake/alert Behavior During Therapy: WFL for tasks assessed/performed Overall Cognitive Status: Within Functional Limits for tasks assessed                      General Comments      Exercises     Assessment/Plan    PT Assessment Patient needs continued PT services  PT Problem  List Decreased strength;Decreased range of motion;Decreased activity tolerance;Decreased mobility;Decreased knowledge of use of DME;Obesity;Pain       PT Treatment Interventions DME instruction;Gait training;Stair training;Functional mobility training;Therapeutic activities;Therapeutic exercise;Patient/family education    PT Goals (Current goals can be  found in the Care Plan section)  Acute Rehab PT Goals Patient Stated Goal: Regain IND PT Goal Formulation: With patient Time For Goal Achievement: 02/05/17 Potential to Achieve Goals: Good    Frequency 7X/week   Barriers to discharge        Co-evaluation               End of Session Equipment Utilized During Treatment: Gait belt Activity Tolerance: Patient tolerated treatment well Patient left: in chair;with call bell/phone within reach;with chair alarm set;with family/visitor present Nurse Communication: Mobility status PT Visit Diagnosis: Difficulty in walking, not elsewhere classified (R26.2)         Time: 9509-3267 PT Time Calculation (min) (ACUTE ONLY): 32 min   Charges:   PT Evaluation $PT Eval Low Complexity: 1 Procedure PT Treatments $Gait Training: 8-22 mins   PT G Codes:         Adaria Hole 02/02/2017, 5:34 PM

## 2017-02-02 NOTE — H&P (View-Only) (Signed)
Manuel Torres DOB: 1961/02/28 Married / Language: English / Race: Black or African American Male Date of Admission:  02/02/2017 CC:  Left Hip pain History of Present Illness  The patient is a 56 year old male who comes in  for a preoperative History and Physical. The patient is scheduled for a left total hip arthroplasty (anterior) to be performed by Dr. Dione Plover. Aluisio, MD at [facility] on 02-02-2017. The patient is a 56 year old male who presented for follow up of their hip. The patient is being followed for their left hip pain and osteoarthritis. Symptoms reported include: pain, pain with weightbearing, difficulty ambulating and difficulty arising from chair. The patient feels that they are doing poorly and report their pain level to be 9 / 10. The following medication has been used for pain control: Percocet. The patient had a previous intra articular hip injection. He states that this injection did not help any. He has had progressively worsening hip pain over the past six months to a year. He has had known arthritis in the hip. I replaced his right hip several years ago and at that point we really noted he had arthritis. He is hurting at all times. It is limiting what he can and cannot too. The intraarticular injection provided minimal benefit. He is now at a point where he would like to proceed with surgery at this time. They have been treated conservatively in the past for the above stated problem and despite conservative measures, they continue to have progressive pain and severe functional limitations and dysfunction. They have failed non-operative management including home exercise, medications, and injections. It is felt that they would benefit from undergoing total joint replacement. Risks and benefits of the procedure have been discussed with the patient and they elect to proceed with surgery. There are no active contraindications to surgery such as ongoing infection or rapidly  progressive neurological disease.  Problem List/Past Medical  Lumbar spinal stenosis (M48.07)  Primary osteoarthritis of left hip (M16.12)  Degenerative lumbar disc (M51.36)  Trochanteric bursitis of left hip (M70.62)  Hypercholesterolemia  Chronic Pain  Gout  High blood pressure  Fatty Liver Disease   Allergies  Robaxin *MUSCULOSKELETAL THERAPY AGENTS*  intractable hiccups  Family History Diabetes Mellitus  Mother. mother Heart Disease  father and grandmother fathers side Heart disease in male family member before age 33  Cancer  grandmother mothers side Cerebrovascular Accident  father Hypertension  mother, father and grandmother fathers side  Social History  Number of flights of stairs before winded  1 Tobacco / smoke exposure  yes Exercise  Exercises rarely; does running / walking Pain Contract  no Children  1 Alcohol use  current drinker; drinks beer and hard liquor; only occasionally per week Previously in rehab  no Drug/Alcohol Rehab (Currently)  no Living situation  live with spouse Tobacco use  Never smoker. never smoker Post-Surgical Plans  Plan is to go home. Marital status  married Current work status  oow since December 2536 Illicit drug use  no Most recent primary occupation  Maintenance  Medication History  Oxycodone-Acetaminophen (5-325MG  Tablet, Oral) Active. Atorvastatin Calcium (20MG  Tablet, Oral) Active. Allopurinol (300MG  Tablet, Oral) Active. Klor-Con M20 [facility] Tablet ER, Oral) Active. Hydrochlorothiazide (25MG  Tablet, Oral) Active. Lisinopril (5MG  Tablet, Oral) Active. Aspirin (81MG  Tablet, Oral) Active. Gabapentin (300MG  Capsule, Oral) Active.  Past Surgical History  Inguinal Hernia Repair  open: right Total Hip Replacement - Right  Date: 2013.   Review of Systems  General Not Present- Chills, Fatigue, Fever, Memory Loss, Night Sweats, Weight Gain and Weight Loss. Skin Not Present- Eczema, Hives,  Itching, Lesions and Rash. HEENT Not Present- Dentures, Double Vision, Headache, Hearing Loss, Tinnitus and Visual Loss. Respiratory Not Present- Allergies, Chronic Cough, Coughing up blood, Shortness of breath at rest and Shortness of breath with exertion. Cardiovascular Not Present- Chest Pain, Difficulty Breathing Lying Down, Murmur, Palpitations, Racing/skipping heartbeats and Swelling. Gastrointestinal Present- Constipation. Not Present- Abdominal Pain, Bloody Stool, Diarrhea, Difficulty Swallowing, Heartburn, Jaundice, Loss of appetitie, Nausea and Vomiting. Male Genitourinary Not Present- Blood in Urine, Discharge, Flank Pain, Incontinence, Painful Urination, Urgency, Urinary frequency, Urinary Retention, Urinating at Night and Weak urinary stream. Musculoskeletal Present- Back Pain, Morning Stiffness and Muscle Weakness. Not Present- Joint Pain, Joint Swelling, Muscle Pain and Spasms. Neurological Not Present- Blackout spells, Difficulty with balance, Dizziness, Paralysis, Tremor and Weakness. Psychiatric Present- Insomnia.  Vitals Weight: 278 lb Height: 71in Body Surface Area: 2.43 m Body Mass Index: 38.77 kg/m  Pulse: 64 (Regular)  BP: 134/86 (Sitting, Left Arm, Standard)    Physical Exam  General Mental Status -Alert, cooperative and good historian. General Appearance-pleasant, Not in acute distress. Orientation-Oriented X3. Build & Nutrition-Well nourished and Well developed.  Head and Neck Head-normocephalic, atraumatic . Neck Global Assessment - supple, no bruit auscultated on the right, no bruit auscultated on the left.  Eye Vision-Wears corrective lenses. Pupil - Bilateral-PERR Motion - Bilateral-EOMI.  Chest and Lung Exam Auscultation Breath sounds - clear at anterior chest wall and clear at posterior chest wall. Adventitious sounds - No Adventitious sounds.  Cardiovascular Auscultation Rhythm - Regular rate and rhythm. Heart Sounds  - S1 WNL and S2 WNL. Murmurs & Other Heart Sounds - Auscultation of the heart reveals - No Murmurs.  Abdomen Inspection Contour - Generalized moderate distention. Palpation/Percussion Tenderness - Abdomen is non-tender to palpation. Rigidity (guarding) - Abdomen is soft. Auscultation Auscultation of the abdomen reveals - Bowel sounds normal.  Male Genitourinary Note: Not done, not pertinent to present illness   Musculoskeletal Note: On exam, he is in no distress. His left hip can be flexed about 90, no internal rotation or external rotation, only about 10 to 20 degrees of abduction. Right hip flexed 110, rotated in 20, out 30, abduct 30 without discomfort.  His radiographs, AP pelvis, lateral of left hip are reviewed. He has got severe end-stage arthritis of the left hip, bone-on-bone with large osteophyte formation.   Assessment & Plan Primary osteoarthritis of left hip (M16.12)  Note:Surgical Plans: Left Total Hip Replacement - Anterior Approach  Disposition: Home, HHPT  PCP: Dr. Wolfgang Phoenix  IV TXA  Patient was instructed on what medications to stop prior to surgery.  Signed electronically by Joelene Millin, III PA-C

## 2017-02-02 NOTE — Interval H&P Note (Signed)
History and Physical Interval Note:  02/02/2017 8:14 AM  Manuel Torres  has presented today for surgery, with the diagnosis of Osteoarthritis Left Hip  The various methods of treatment have been discussed with the patient and family. After consideration of risks, benefits and other options for treatment, the patient has consented to  Procedure(s): LEFT TOTAL HIP ARTHROPLASTY ANTERIOR APPROACH (Left) as a surgical intervention .  The patient's history has been reviewed, patient examined, no change in status, stable for surgery.  I have reviewed the patient's chart and labs.  Questions were answered to the patient's satisfaction.     Gearlean Alf

## 2017-02-02 NOTE — Anesthesia Postprocedure Evaluation (Signed)
Anesthesia Post Note  Patient: Manuel Torres  Procedure(s) Performed: Procedure(s) (LRB): LEFT TOTAL HIP ARTHROPLASTY ANTERIOR APPROACH (Left)  Patient location during evaluation: PACU Anesthesia Type: General Level of consciousness: awake and alert Pain management: pain level controlled Vital Signs Assessment: post-procedure vital signs reviewed and stable Respiratory status: spontaneous breathing, nonlabored ventilation, respiratory function stable and patient connected to nasal cannula oxygen Cardiovascular status: blood pressure returned to baseline and stable Postop Assessment: no signs of nausea or vomiting Anesthetic complications: no       Last Vitals:  Vitals:   02/02/17 1300 02/02/17 1330  BP: (!) 141/69 (!) 169/81  Pulse: 65 73  Resp: 19 16  Temp: 37.1 C 36.9 C    Last Pain:  Vitals:   02/02/17 1300  TempSrc:   PainSc: 3                  Reet Scharrer DAVID

## 2017-02-02 NOTE — Anesthesia Procedure Notes (Signed)
Procedure Name: Intubation Date/Time: 02/02/2017 8:53 AM Performed by: Montel Clock Pre-anesthesia Checklist: Patient identified, Emergency Drugs available, Suction available, Patient being monitored and Timeout performed Patient Re-evaluated:Patient Re-evaluated prior to inductionOxygen Delivery Method: Circle system utilized Preoxygenation: Pre-oxygenation with 100% oxygen Intubation Type: IV induction Ventilation: Mask ventilation without difficulty, Oral airway inserted - appropriate to patient size and Two handed mask ventilation required Laryngoscope Size: Mac and 3 Grade View: Grade II Tube type: Oral Tube size: 7.5 mm Number of attempts: 1 Airway Equipment and Method: Stylet Placement Confirmation: ETT inserted through vocal cords under direct vision,  positive ETCO2 and breath sounds checked- equal and bilateral Secured at: 23 cm Tube secured with: Tape Dental Injury: Teeth and Oropharynx as per pre-operative assessment  Comments: Arytenoids and very bottom of cords visible with downward laryngeal pressure. ETT easily passed.

## 2017-02-03 ENCOUNTER — Encounter (HOSPITAL_COMMUNITY): Payer: Self-pay | Admitting: Orthopedic Surgery

## 2017-02-03 LAB — BASIC METABOLIC PANEL
ANION GAP: 7 (ref 5–15)
BUN: 11 mg/dL (ref 6–20)
CO2: 26 mmol/L (ref 22–32)
CREATININE: 0.81 mg/dL (ref 0.61–1.24)
Calcium: 8.5 mg/dL — ABNORMAL LOW (ref 8.9–10.3)
Chloride: 102 mmol/L (ref 101–111)
GFR calc Af Amer: >60 mL/min (ref >60)
GFR calc non Af Amer: >60 mL/min (ref >60)
GLUCOSE: 178 mg/dL — AB (ref 65–99)
Potassium: 4.1 mmol/L (ref 3.5–5.1)
Sodium: 135 mmol/L (ref 135–145)

## 2017-02-03 LAB — CBC
HEMATOCRIT: 30.4 % — AB (ref 39.0–52.0)
Hemoglobin: 11 g/dL — ABNORMAL LOW (ref 13.0–17.0)
MCH: 33.1 pg (ref 26.0–34.0)
MCHC: 36.2 g/dL — AB (ref 30.0–36.0)
MCV: 91.6 fL (ref 78.0–100.0)
Platelets: 222 10*3/uL (ref 150–400)
RBC: 3.32 MIL/uL — ABNORMAL LOW (ref 4.22–5.81)
RDW: 13 % (ref 11.5–15.5)
WBC: 10 10*3/uL (ref 4.0–10.5)

## 2017-02-03 LAB — GLUCOSE, CAPILLARY
GLUCOSE-CAPILLARY: 229 mg/dL — AB (ref 65–99)
Glucose-Capillary: 142 mg/dL — ABNORMAL HIGH (ref 65–99)

## 2017-02-03 MED ORDER — RIVAROXABAN 10 MG PO TABS: 10.0000 mg | ORAL_TABLET | Freq: Every day | ORAL | 0 refills | Status: DC

## 2017-02-03 MED ORDER — TRAMADOL HCL 50 MG PO TABS: 50.0000 mg | ORAL_TABLET | Freq: Four times a day (QID) | ORAL | 0 refills | Status: DC | PRN

## 2017-02-03 MED ORDER — INSULIN ASPART 100 UNIT/ML ~~LOC~~ SOLN
0.0000 [IU] | Freq: Three times a day (TID) | SUBCUTANEOUS | Status: DC
  Administered 2017-02-03: 13:00:00 5 [IU] via SUBCUTANEOUS

## 2017-02-03 MED ORDER — GABAPENTIN 300 MG PO CAPS: 300.0000 mg | ORAL_CAPSULE | Freq: Three times a day (TID) | ORAL | 0 refills | Status: DC | PRN

## 2017-02-03 MED ORDER — OXYCODONE HCL 5 MG PO TABS: 5.0000 mg | ORAL_TABLET | ORAL | 0 refills | Status: DC | PRN

## 2017-02-03 NOTE — Discharge Instructions (Addendum)
° °Dr. Frank Aluisio °Total Joint Specialist °[facility] °[address] °[place], [place] [address] °[phone] ° °ANTERIOR APPROACH TOTAL HIP REPLACEMENT POSTOPERATIVE DIRECTIONS ° ° °Hip Rehabilitation, Guidelines Following Surgery  °The results of a hip operation are greatly improved after range of motion and muscle strengthening exercises. Follow all safety measures which are given to protect your hip. If any of these exercises cause increased pain or swelling in your joint, decrease the amount until you are comfortable again. Then slowly increase the exercises. Call your caregiver if you have problems or questions.  ° °HOME CARE INSTRUCTIONS  °Remove items at home which could result in a fall. This includes throw rugs or furniture in walking pathways.  °· ICE to the affected hip every three hours for 30 minutes at a time and then as needed for pain and swelling.  Continue to use ice on the hip for pain and swelling from surgery. You may notice swelling that will progress down to the foot and ankle.  This is normal after surgery.  Elevate the leg when you are not up walking on it.   °· Continue to use the breathing machine which will help keep your temperature down.  It is common for your temperature to cycle up and down following surgery, especially at night when you are not up moving around and exerting yourself.  The breathing machine keeps your lungs expanded and your temperature down. ° ° °DIET °You may resume your previous home diet once your are discharged from the hospital. ° °DRESSING / WOUND CARE / SHOWERING °You may shower 3 days after surgery, but keep the wounds dry during showering.  You may use an occlusive plastic wrap (Press'n Seal for example), NO SOAKING/SUBMERGING IN THE BATHTUB.  If the bandage gets wet, change with a clean dry gauze.  If the incision gets wet, pat the wound dry with a clean towel. °You may start showering once you are discharged home but do not  submerge the incision under water. Just pat the incision dry and apply a dry gauze dressing on daily. °Change the surgical dressing daily and reapply a dry dressing each time. ° °ACTIVITY °Walk with your walker as instructed. °Use walker as long as suggested by your caregivers. °Avoid periods of inactivity such as sitting longer than an hour when not asleep. This helps prevent blood clots.  °You may resume a sexual relationship in one month or when given the OK by your doctor.  °You may return to work once you are cleared by your doctor.  °Do not drive a car for 6 weeks or until released by you surgeon.  °Do not drive while taking narcotics. ° °WEIGHT BEARING °Weight bearing as tolerated with assist device (walker, cane, etc) as directed, use it as long as suggested by your surgeon or therapist, typically at least 4-6 weeks. ° °POSTOPERATIVE CONSTIPATION PROTOCOL °Constipation - defined medically as fewer than three stools per week and severe constipation as less than one stool per week. ° °One of the most common issues patients have following surgery is constipation.  Even if you have a regular bowel pattern at home, your normal regimen is likely to be disrupted due to multiple reasons following surgery.  Combination of anesthesia, postoperative narcotics, change in appetite and fluid intake all can affect your bowels.  In order to avoid complications following surgery, here are some recommendations in order to help you during your recovery period. ° °Colace (docusate) - Pick up an over-the-counter   form of Colace or another stool softener and take twice a day as long as you are requiring postoperative pain medications.  Take with a full glass of water daily.  If you experience loose stools or diarrhea, hold the colace until you stool forms back up.  If your symptoms do not get better within 1 week or if they get worse, check with your doctor. ° °Dulcolax (bisacodyl) - Pick up over-the-counter and take as directed  by the product packaging as needed to assist with the movement of your bowels.  Take with a full glass of water.  Use this product as needed if not relieved by Colace only.  ° °MiraLax (polyethylene glycol) - Pick up over-the-counter to have on hand.  MiraLax is a solution that will increase the amount of water in your bowels to assist with bowel movements.  Take as directed and can mix with a glass of water, juice, soda, coffee, or tea.  Take if you go more than two days without a movement. °Do not use MiraLax more than once per day. Call your doctor if you are still constipated or irregular after using this medication for 7 days in a row. ° °If you continue to have problems with postoperative constipation, please contact the office for further assistance and recommendations.  If you experience "the worst abdominal pain ever" or develop nausea or vomiting, please contact the office immediatly for further recommendations for treatment. ° °ITCHING ° If you experience itching with your medications, try taking only a single pain pill, or even half a pain pill at a time.  You can also use Benadryl over the counter for itching or also to help with sleep.  ° °TED HOSE STOCKINGS °Wear the elastic stockings on both legs for three weeks following surgery during the day but you may remove then at night for sleeping. ° °MEDICATIONS °See your medication summary on the “After Visit Summary” that the nursing staff will review with you prior to discharge.  You may have some home medications which will be placed on hold until you complete the course of blood thinner medication.  It is important for you to complete the blood thinner medication as prescribed by your surgeon.  Continue your approved medications as instructed at time of discharge. ° °PRECAUTIONS °If you experience chest pain or shortness of breath - call 911 immediately for transfer to the hospital emergency department.  °If you develop a fever greater that 101 F,  purulent drainage from wound, increased redness or drainage from wound, foul odor from the wound/dressing, or calf pain - CONTACT YOUR SURGEON.   °                                                °FOLLOW-UP APPOINTMENTS °Make sure you keep all of your appointments after your operation with your surgeon and caregivers. You should call the office at the above phone number and make an appointment for approximately two weeks after the date of your surgery or on the date instructed by your surgeon outlined in the "After Visit Summary". ° °RANGE OF MOTION AND STRENGTHENING EXERCISES  °These exercises are designed to help you keep full movement of your hip joint. Follow your caregiver's or physical therapist's instructions. Perform all exercises about fifteen times, three times per day or as directed. Exercise both hips, even if you   have had only one joint replacement. These exercises can be done on a training (exercise) mat, on the floor, on a table or on a bed. Use whatever works the best and is most comfortable for you. Use music or television while you are exercising so that the exercises are a pleasant break in your day. This will make your life better with the exercises acting as a break in routine you can look forward to.  Lying on your back, slowly slide your foot toward your buttocks, raising your knee up off the floor. Then slowly slide your foot back down until your leg is straight again.  Lying on your back spread your legs as far apart as you can without causing discomfort.  Lying on your side, raise your upper leg and foot straight up from the floor as far as is comfortable. Slowly lower the leg and repeat.  Lying on your back, tighten up the muscle in the front of your thigh (quadriceps muscles). You can do this by keeping your leg straight and trying to raise your heel off the floor. This helps strengthen the largest muscle supporting your knee.  Lying on your back, tighten up the muscles of your  buttocks both with the legs straight and with the knee bent at a comfortable angle while keeping your heel on the floor.   IF YOU ARE TRANSFERRED TO A SKILLED REHAB FACILITY If the patient is transferred to a skilled rehab facility following release from the hospital, a list of the current medications will be sent to the facility for the patient to continue.  When discharged from the skilled rehab facility, please have the facility set up the patient's [place] prior to being released. Also, the skilled facility will be responsible for providing the patient with their medications at time of release from the facility to include their pain medication, the muscle relaxants, and their blood thinner medication. If the patient is still at the rehab facility at time of the two week follow up appointment, the skilled rehab facility will also need to assist the patient in arranging follow up appointment in our office and any transportation needs.  MAKE SURE YOU:  Understand these instructions.  Get help right away if you are not doing well or get worse.    Pick up stool softner and laxative for home use following surgery while on pain medications. Do not submerge incision under water. Please use good hand washing techniques while changing dressing each day. May shower starting three days after surgery. Please use a clean towel to pat the incision dry following showers. Continue to use ice for pain and swelling after surgery. Do not use any lotions or creams on the incision until instructed by your surgeon.  Take Xarelto for two and a half more weeks following discharge from the hospital, then discontinue Xarelto. Once the patient has completed the Xarelto, they may resume the 81 mg Aspirin.    Information on my medicine - XARELTO (Rivaroxaban)  This medication education was reviewed with me or my healthcare representative as part of my discharge preparation.  The pharmacist that  spoke with me during my hospital stay was:  Tommie Raymond, Student-PharmD  Why was Xarelto prescribed for you? Xarelto was prescribed for you to reduce the risk of blood clots forming after orthopedic surgery. The medical term for these abnormal blood clots is venous thromboembolism (VTE).  What do you need to know about xarelto ? Take your Xarelto ONCE  DAILY at the same time every day. You may take it either with or without food.  If you have difficulty swallowing the tablet whole, you may crush it and mix in applesauce just prior to taking your dose.  Take Xarelto exactly as prescribed by your doctor and DO NOT stop taking Xarelto without talking to the doctor who prescribed the medication.  Stopping without other VTE prevention medication to take the place of Xarelto may increase your risk of developing a clot.  After discharge, you should have regular check-up appointments with your healthcare provider that is prescribing your Xarelto.    What do you do if you miss a dose? If you miss a dose, take it as soon as you remember on the same day then continue your regularly scheduled once daily regimen the next day. Do not take two doses of Xarelto on the same day.   Important Safety Information A possible side effect of Xarelto is bleeding. You should call your healthcare provider right away if you experience any of the following: ? Bleeding from an injury or your nose that does not stop. ? Unusual colored urine (red or dark brown) or unusual colored stools (red or black). ? Unusual bruising for unknown reasons. ? A serious fall or if you hit your head (even if there is no bleeding).  Some medicines may interact with Xarelto and might increase your risk of bleeding while on Xarelto. To help avoid this, consult your healthcare provider or pharmacist prior to using any new prescription or non-prescription medications, including herbals, vitamins, non-steroidal anti-inflammatory drugs  (NSAIDs) and supplements.  This website has more information on Xarelto: [email or web address]

## 2017-02-03 NOTE — Progress Notes (Signed)
Physical Therapy Treatment Patient Details Name: Manuel Torres MRN: [id number] DOB: 1961/08/27 Today's Date: 02/03/2017    History of Present Illness Pt s/p L THR and with hx of R THR (posterior)    PT Comments    Good progress with mobility and pt motivated to dc home this pm.  Reviewed stairs and car transfers with pt and spouse.   Follow Up Recommendations  Home health PT     Equipment Recommendations  None recommended by PT    Recommendations for Other Services       Precautions / Restrictions Precautions Precautions: Fall Restrictions Weight Bearing Restrictions: No Other Position/Activity Restrictions: WBAT    Mobility  Bed Mobility Overal bed mobility: Needs Assistance Bed Mobility: Supine to Sit;Sit to Supine     Supine to sit: Min assist Sit to supine: Min guard   General bed mobility comments: cues for sequence and use of R LE to self assist.  Physical assist to manage L  LE and to bring trunk to upright  Transfers Overall transfer level: Needs assistance Equipment used: Rolling walker (2 wheeled) Transfers: Sit to/from Stand Sit to Stand: Min guard;Supervision         General transfer comment: cues for LE management and use of UEs to self assist  Ambulation/Gait Ambulation/Gait assistance: Min guard;Supervision Ambulation Distance (Feet): 40 Feet Assistive device: Rolling walker (2 wheeled) Gait Pattern/deviations: Step-to pattern;Decreased step length - right;Decreased step length - left;Shuffle;Trunk flexed Gait velocity: decr Gait velocity interpretation: Below normal speed for age/gender General Gait Details: cues for sequence, posture and position from RW   Stairs Stairs: Yes   Stair Management: No rails;Step to pattern;Backwards;With walker Number of Stairs: 4 General stair comments: 2 stairs twice with cues for sequence and foot/RW placement.  Written instructions provided and reviewed stairs with spouse   Wheelchair  Mobility    Modified Rankin (Stroke Patients Only)       Balance                                    Cognition Arousal/Alertness: Awake/alert Behavior During Therapy: WFL for tasks assessed/performed Overall Cognitive Status: Within Functional Limits for tasks assessed                      Exercises      General Comments        Pertinent Vitals/Pain Pain Assessment: 0-10 Pain Score: 5  Pain Location: L hip Pain Descriptors / Indicators: Aching;Sore Pain Intervention(s): Limited activity within patient's tolerance;Monitored during session;Premedicated before session;Ice applied    Home Living                      Prior Function            PT Goals (current goals can now be found in the care plan section) Acute Rehab PT Goals Patient Stated Goal: Regain IND PT Goal Formulation: With patient Time For Goal Achievement: 02/05/17 Potential to Achieve Goals: Good Progress towards PT goals: Progressing toward goals    Frequency    7X/week      PT Plan Current plan remains appropriate    Co-evaluation             End of Session Equipment Utilized During Treatment: Gait belt Activity Tolerance: Patient tolerated treatment well Patient left: in chair;with call bell/phone within reach;with chair alarm set Nurse Communication: Mobility status  PT Visit Diagnosis: Difficulty in walking, not elsewhere classified (R26.2)     Time: 6286-3817 PT Time Calculation (min) (ACUTE ONLY): 45 min  Charges:  $Gait Training: 23-37 mins $Therapeutic Activity: 8-22 mins                    G Codes:       Terelle Dobler 02-23-2017, 4:34 PM

## 2017-02-03 NOTE — Progress Notes (Signed)
   Subjective: 1 Day Post-Op Procedure(s) (LRB): LEFT TOTAL HIP ARTHROPLASTY ANTERIOR APPROACH (Left) Patient reports pain as mild.   Patient seen in rounds by Dr. Wynelle Link.  Looks great this morning. Patient is well, and has had no acute complaints or problems We will start therapy today.  If they do well with therapy and meets all goals, then will allow home later this afternoon following therapy. Plan is to go home with HHPT after hospital stay.  Objective: Vital signs in last 24 hours: Temp:  [97.6 F (36.4 C)-99.1 F (37.3 C)] 99.1 F (37.3 C) (03/22 0535) Pulse Rate:  [65-102] 83 (03/22 0535) Resp:  [13-24] 19 (03/22 0535) BP: (131-192)/(53-117) 147/75 (03/22 0535) SpO2:  [98 %-100 %] 99 % (03/22 0535)  Intake/Output from previous day:  Intake/Output Summary (Last 24 hours) at 02/03/17 0900 Last data filed at 02/03/17 0600  Gross per 24 hour  Intake             3770 ml  Output             3425 ml  Net              345 ml    Intake/Output this shift: No intake/output data recorded.  Labs:  Recent Labs  02/03/17 0423  HGB 11.0*    Recent Labs  02/03/17 0423  WBC 10.0  RBC 3.32*  HCT 30.4*  PLT 222    Recent Labs  02/02/17 0705 02/03/17 0423  NA 135 135  K 3.7 4.1  CL 100* 102  CO2 27 26  BUN 12 11  CREATININE 0.89 0.81  GLUCOSE 148* 178*  CALCIUM 9.2 8.5*   No results for input(s): LABPT, INR in the last 72 hours.  EXAM General - Patient is Alert, Appropriate and Oriented Extremity - Neurovascular intact Sensation intact distally Intact pulses distally Dorsiflexion/Plantar flexion intact Dressing - dressing C/D/I Motor Function - intact, moving foot and toes well on exam.  Hemovac pulled without difficulty.  Past Medical History:  Diagnosis Date  . Arthritis    Hip  . Gout    NO RECENT FLARE UPS  . Gout   . Hypercholesteremia   . Hypertension     Assessment/Plan: 1 Day Post-Op Procedure(s) (LRB): LEFT TOTAL HIP ARTHROPLASTY  ANTERIOR APPROACH (Left) Active Problems:   OA (osteoarthritis) of hip  Estimated body mass index is 38.91 kg/m as calculated from the following:   Height as of this encounter: 5\' 11"  (1.803 m).   Weight as of this encounter: 126.6 kg (279 lb). Advance diet Up with therapy Discharge home with home health if meets goals with therapy today  DVT Prophylaxis - Xarelto Weight Bearing As Tolerated left Leg Hemovac Pulled Begin Therapy  If meets goals and able to go home: Discharge home with home health if meets goals Diet - Cardiac diet Follow up - in 2 weeks Activity - WBAT Disposition - Home Condition Upon Discharge - pending therapy D/C Meds - See DC Summary DVT Prophylaxis - Xarelto  Arlee Muslim, PA-C Orthopaedic Surgery 02/03/2017, 9:00 AM

## 2017-02-03 NOTE — Progress Notes (Signed)
Pt to d/c home with [facility]. No needed DME Prescriptions given. AVS reviewed and "My Chart" discussed with pt. Pt capable of verbalizing medications, dressing changes, signs and symptoms of infection, and follow-up appointments. Remains hemodynamically stable. No signs and symptoms of distress. Educated pt to return to ER in the case of SOB, dizziness, or chest pain.

## 2017-02-03 NOTE — Care Management Note (Signed)
Case Management Note  Patient Details  Name: Manuel Torres MRN: [id number] Date of Birth: 1960-11-25  Subjective/Objective:                  LEFT TOTAL HIP ARTHROPLASTY ANTERIOR APPROACH (Left) Action/Plan: Discharge planning Expected Discharge Date:  02/03/17               Expected Discharge Plan:  [facility]  In-House Referral:     Discharge planning Services  CM Consult  Post Acute Care Choice:  Home Health Choice offered to:  Patient  DME Arranged:  N/A DME Agency:  NA  HH Arranged:  PT [place] Agency:  [facility] ([facility]  Status of Service:  Completed, signed off  If discussed at [employer], dates discussed:    Additional Comments: CM met with pt in room to offer choice of home health agency. Pt chooses [facility] to render HHPT.  Referral given to [facility] rep, Tim. Pt states he as all the DME needed at home. NO other CM needs were communicated. Dellie Catholic, RN 02/03/2017, 12:57 PM

## 2017-02-03 NOTE — Discharge Summary (Signed)
Physician Discharge Summary   Patient ID: Manuel Torres MRN: [id number] DOB/AGE: Jun 11, 1961 56 y.o.  Admit date: 02/02/2017 Discharge date: 02/03/2017  Primary Diagnosis:  Osteoarthritis of the Left hip.  Admission Diagnoses:  Past Medical History:  Diagnosis Date  . Arthritis    Hip  . Gout    NO RECENT FLARE UPS  . Gout   . Hypercholesteremia   . Hypertension    Discharge Diagnoses:   Active Problems:   OA (osteoarthritis) of hip  Estimated body mass index is 38.91 kg/m as calculated from the following:   Height as of this encounter: _0  (1.803 m).   Weight as of this encounter: 126.6 kg (279 lb).  Procedure(s) (LRB): LEFT TOTAL HIP ARTHROPLASTY ANTERIOR APPROACH (Left)   Consults: None  HPI:  Laboratory Data: Admission on 02/02/2017  Component Date Value Ref Range Status  . Sodium 02/02/2017 135  135 - 145 mmol/L Final  . Potassium 02/02/2017 3.7  3.5 - 5.1 mmol/L Final  . Chloride 02/02/2017 100* 101 - 111 mmol/L Final  . CO2 02/02/2017 27  22 - 32 mmol/L Final  . Glucose, Bld 02/02/2017 148* 65 - 99 mg/dL Final  . BUN 02/02/2017 12  6 - 20 mg/dL Final  . Creatinine, Ser 02/02/2017 0.89  0.61 - 1.24 mg/dL Final  . Calcium 02/02/2017 9.2  8.9 - 10.3 mg/dL Final  . Total Protein 02/02/2017 7.6  6.5 - 8.1 g/dL Final  . Albumin 02/02/2017 3.9  3.5 - 5.0 g/dL Final  . AST 02/02/2017 50* 15 - 41 U/L Final  . ALT 02/02/2017 41  17 - 63 U/L Final  . Alkaline Phosphatase 02/02/2017 63  38 - 126 U/L Final  . Total Bilirubin 02/02/2017 0.8  0.3 - 1.2 mg/dL Final  . GFR calc non Af Amer 02/02/2017 >60  >60 mL/min Final  . GFR calc Af Amer 02/02/2017 >60  >60 mL/min Final   Comment: (NOTE) The eGFR has been calculated using the CKD EPI equation. This calculation has not been validated in all clinical situations. eGFR's persistently <60 mL/min signify possible Chronic Kidney Disease.   . Anion gap 02/02/2017 8  5 - 15 Final  . Glucose-Capillary 02/02/2017 149*  65 - 99 mg/dL Final  . WBC 02/03/2017 10.0  4.0 - 10.5 K/uL Final  . RBC 02/03/2017 3.32* 4.22 - 5.81 MIL/uL Final  . Hemoglobin 02/03/2017 11.0* 13.0 - 17.0 g/dL Final  . HCT 02/03/2017 30.4* 39.0 - 52.0 % Final  . MCV 02/03/2017 91.6  78.0 - 100.0 fL Final  . MCH 02/03/2017 33.1  26.0 - 34.0 pg Final  . MCHC 02/03/2017 36.2* 30.0 - 36.0 g/dL Final  . RDW 02/03/2017 13.0  11.5 - 15.5 % Final  . Platelets 02/03/2017 222  150 - 400 K/uL Final  . Sodium 02/03/2017 135  135 - 145 mmol/L Final  . Potassium 02/03/2017 4.1  3.5 - 5.1 mmol/L Final  . Chloride 02/03/2017 102  101 - 111 mmol/L Final  . CO2 02/03/2017 26  22 - 32 mmol/L Final  . Glucose, Bld 02/03/2017 178* 65 - 99 mg/dL Final  . BUN 02/03/2017 11  6 - 20 mg/dL Final  . Creatinine, Ser 02/03/2017 0.81  0.61 - 1.24 mg/dL Final  . Calcium 02/03/2017 8.5* 8.9 - 10.3 mg/dL Final  . GFR calc non Af Amer 02/03/2017 >60  >60 mL/min Final  . GFR calc Af Amer 02/03/2017 >60  >60 mL/min Final   Comment: (NOTE) The eGFR  has been calculated using the CKD EPI equation. This calculation has not been validated in all clinical situations. eGFR's persistently <60 mL/min signify possible Chronic Kidney Disease.   . Anion gap 02/03/2017 7  5 - 15 Final  . Glucose-Capillary 02/02/2017 258* 65 - 99 mg/dL Final  . Glucose-Capillary 02/02/2017 245* 65 - 99 mg/dL Final  . Glucose-Capillary 02/03/2017 142* 65 - 99 mg/dL Final  Hospital Outpatient Visit on 01/27/2017  Component Date Value Ref Range Status  . aPTT 01/27/2017 30  24 - 36 seconds Final  . WBC 01/27/2017 8.4  4.0 - 10.5 K/uL Final  . RBC 01/27/2017 4.51  4.22 - 5.81 MIL/uL Final  . Hemoglobin 01/27/2017 14.5  13.0 - 17.0 g/dL Final  . HCT 01/27/2017 41.5  39.0 - 52.0 % Final  . MCV 01/27/2017 92.0  78.0 - 100.0 fL Final  . MCH 01/27/2017 32.2  26.0 - 34.0 pg Final  . MCHC 01/27/2017 34.9  30.0 - 36.0 g/dL Final  . RDW 01/27/2017 13.2  11.5 - 15.5 % Final  . Platelets 01/27/2017 271   150 - 400 K/uL Final  . Prothrombin Time 01/27/2017 12.8  11.4 - 15.2 seconds Final  . INR 01/27/2017 0.96   Final  . ABO/RH(D) 01/27/2017 AB POS   Final  . Antibody Screen 01/27/2017 NEG   Final  . Sample Expiration 01/27/2017 02/05/2017   Final  . Extend sample reason 01/27/2017 NO TRANSFUSIONS OR PREGNANCY IN THE PAST 3 MONTHS   Final  . MRSA, PCR 01/27/2017 NEGATIVE  NEGATIVE Final  . Staphylococcus aureus 01/27/2017 NEGATIVE  NEGATIVE Final   Comment:        The Xpert SA Assay (FDA approved for NASAL specimens in patients over 50 years of age), is one component of a comprehensive surveillance program.  Test performance has been validated by [facility] for patients greater than or equal to 60 year old. It is not intended to diagnose infection nor to guide or monitor treatment.   Office Visit on 01/25/2017  Component Date Value Ref Range Status  . WBC 01/25/2017 7.0  3.4 - 10.8 x10E3/uL Final  . RBC 01/25/2017 4.70  4.14 - 5.80 x10E6/uL Final  . Hemoglobin 01/25/2017 15.4  13.0 - 17.7 g/dL Final  . Hematocrit 01/25/2017 43.3  37.5 - 51.0 % Final  . MCV 01/25/2017 92  79 - 97 fL Final  . MCH 01/25/2017 32.8  26.6 - 33.0 pg Final  . MCHC 01/25/2017 35.6  31.5 - 35.7 g/dL Final  . RDW 01/25/2017 13.9  12.3 - 15.4 % Final  . Platelets 01/25/2017 268  150 - 379 x10E3/uL Final  . Neutrophils 01/25/2017 48  Not Estab. % Final  . Lymphs 01/25/2017 41  Not Estab. % Final  . Monocytes 01/25/2017 8  Not Estab. % Final  . Eos 01/25/2017 3  Not Estab. % Final  . Basos 01/25/2017 0  Not Estab. % Final  . Neutrophils Absolute 01/25/2017 3.4  1.4 - 7.0 x10E3/uL Final  . Lymphocytes Absolute 01/25/2017 2.9  0.7 - 3.1 x10E3/uL Final  . Monocytes Absolute 01/25/2017 0.6  0.1 - 0.9 x10E3/uL Final  . EOS (ABSOLUTE) 01/25/2017 0.2  0.0 - 0.4 x10E3/uL Final  . Basophils Absolute 01/25/2017 0.0  0.0 - 0.2 x10E3/uL Final  . Immature Granulocytes 01/25/2017 0  Not Estab. % Final  . Immature  Grans (Abs) 01/25/2017 0.0  0.0 - 0.1 x10E3/uL Final  . Cholesterol, Total 01/25/2017 233* 100 - 199 mg/dL Final  .  Triglycerides 01/25/2017 174* 0 - 149 mg/dL Final  . HDL 01/25/2017 41  >39 mg/dL Final  . VLDL Cholesterol Cal 01/25/2017 35  5 - 40 mg/dL Final  . LDL Calculated 01/25/2017 157* 0 - 99 mg/dL Final  . Chol/HDL Ratio 01/25/2017 5.7* 0.0 - 5.0 ratio units Final   Comment:                                   T. Chol/HDL Ratio                                             Men  Women                               1/2 Avg.Risk  3.4    3.3                                   Avg.Risk  5.0    4.4                                2X Avg.Risk  9.6    7.1                                3X Avg.Risk 23.4   11.0   . Total Protein 01/25/2017 7.3  6.0 - 8.5 g/dL Final  . Albumin 01/25/2017 4.4  3.5 - 5.5 g/dL Final  . Bilirubin Total 01/25/2017 0.4  0.0 - 1.2 mg/dL Final  . Bilirubin, Direct 01/25/2017 0.11  0.00 - 0.40 mg/dL Final  . Alkaline Phosphatase 01/25/2017 81  39 - 117 IU/L Final  . AST 01/25/2017 45* 0 - 40 IU/L Final  . ALT 01/25/2017 49* 0 - 44 IU/L Final  . Uric Acid 01/25/2017 5.2  3.7 - 8.6 mg/dL Final  . Hgb A1c MFr Bld 01/25/2017 6.6* 4.8 - 5.6 % Final   Comment:          Pre-diabetes: 5.7 - 6.4          Diabetes: >6.4          Glycemic control for adults with diabetes: <7.0   . Est. average glucose Bld gHb Est-m* 01/25/2017 143  mg/dL Final  . Glucose 01/25/2017 120* 65 - 99 mg/dL Final  . BUN 01/25/2017 9  6 - 24 mg/dL Final  . Creatinine, Ser 01/25/2017 0.89  0.76 - 1.27 mg/dL Final  . GFR calc non Af Amer 01/25/2017 96  >59 mL/min/1.73 Final  . GFR calc Af Amer 01/25/2017 110  >59 mL/min/1.73 Final  . BUN/Creatinine Ratio 01/25/2017 10  9 - 20 Final  . Sodium 01/25/2017 137  134 - 144 mmol/L Final  . Potassium 01/25/2017 4.8  3.5 - 5.2 mmol/L Final  . Chloride 01/25/2017 99  96 - 106 mmol/L Final  . CO2 01/25/2017 21  18 - 29 mmol/L Final  . Calcium 01/25/2017 9.7   8.7 - 10.2 mg/dL Final     X-Rays:Dg Pelvis Portable  Result Date: 02/02/2017 CLINICAL DATA:  Left hip replacement EXAM:  DG C-ARM 61-120 MIN-NO REPORT; PORTABLE PELVIS 1-2 VIEWS COMPARISON:  10/23/2012 FINDINGS: Interval left hip replacement in satisfactory position and alignment. No fracture Pre-existing right hip replacement appears unchanged from the prior study. IMPRESSION: Satisfactory left hip replacement Electronically Signed   By: Franchot Gallo M.D.   On: 02/02/2017 11:44   Dg C-arm 61-120 Min-no Report  Result Date: 02/02/2017 Fluoroscopy was utilized by the requesting physician.  No radiographic interpretation.    EKG: Orders placed or performed during the hospital encounter of 01/27/17  . EKG 12 lead  . EKG 12 lead     Hospital Course: Patient was admitted to [facility] and taken to the OR and underwent the above state procedure without complications.  Patient tolerated the procedure well and was later transferred to the recovery room and then to the orthopaedic floor for postoperative care.  They were given PO and IV analgesics for pain control following their surgery.  They were given 24 hours of postoperative antibiotics of  Anti-infectives    Start     Dose/Rate Route Frequency Ordered Stop   02/02/17 1430  ceFAZolin (ANCEF) IVPB 2g/100 mL premix     2 g 200 mL/hr over 30 Minutes Intravenous Every 6 hours 02/02/17 1338 02/02/17 2134   02/02/17 0600  ceFAZolin (ANCEF) 3 g in dextrose 5 % 50 mL IVPB     3 g 130 mL/hr over 30 Minutes Intravenous On call to O.R. 02/01/17 1148 02/02/17 0857     and started on DVT prophylaxis in the form of Xarelto.   PT and OT were ordered for total hip protocol.  The patient was allowed to be WBAT with therapy. Discharge planning was consulted to help with postop disposition and equipment needs.  Patient had a very good night on the evening of surgery.  They started to get up OOB with therapy on day one.  Hemovac drain was pulled  without difficulty.  Patient was seen in rounds on POD 1 and was ready to go home later that same day  Discharge home with home health if meets goals Diet - Cardiac diet Follow up - in 2 weeks Activity - WBAT Disposition - Home Condition Upon Discharge - improving D/C Meds - See DC Summary DVT Prophylaxis - Xarelto   Discharge Instructions    Call MD / Call 911    Complete by:  As directed    If you experience chest pain or shortness of breath, CALL 911 and be transported to the hospital emergency room.  If you develope a fever above 101 F, pus (white drainage) or increased drainage or redness at the wound, or calf pain, call your surgeon's office.   Change dressing    Complete by:  As directed    You may change your dressing dressing daily with sterile 4 x 4 inch gauze dressing and paper tape.  Do not submerge the incision under water.   Constipation Prevention    Complete by:  As directed    Drink plenty of fluids.  Prune juice may be helpful.  You may use a stool softener, such as Colace (over the counter) 100 mg twice a day.  Use MiraLax (over the counter) for constipation as needed.   Diet - low sodium heart healthy    Complete by:  As directed    Diet Carb Modified    Complete by:  As directed    Discharge instructions    Complete by:  As directed  Pick up stool softner and laxative for home use following surgery while on pain medications. Do not submerge incision under water. Please use good hand washing techniques while changing dressing each day. May shower starting three days after surgery. Please use a clean towel to pat the incision dry following showers. Continue to use ice for pain and swelling after surgery. Do not use any lotions or creams on the incision until instructed by your surgeon.  Wear both TED hose on both legs during the day every day for three weeks, but may have off at night at home.  Postoperative Constipation Protocol  Constipation - defined  medically as fewer than three stools per week and severe constipation as less than one stool per week.  One of the most common issues patients have following surgery is constipation.  Even if you have a regular bowel pattern at home, your normal regimen is likely to be disrupted due to multiple reasons following surgery.  Combination of anesthesia, postoperative narcotics, change in appetite and fluid intake all can affect your bowels.  In order to avoid complications following surgery, here are some recommendations in order to help you during your recovery period.  Colace (docusate) - Pick up an over-the-counter form of Colace or another stool softener and take twice a day as long as you are requiring postoperative pain medications.  Take with a full glass of water daily.  If you experience loose stools or diarrhea, hold the colace until you stool forms back up.  If your symptoms do not get better within 1 week or if they get worse, check with your doctor.  Dulcolax (bisacodyl) - Pick up over-the-counter and take as directed by the product packaging as needed to assist with the movement of your bowels.  Take with a full glass of water.  Use this product as needed if not relieved by Colace only.   MiraLax (polyethylene glycol) - Pick up over-the-counter to have on hand.  MiraLax is a solution that will increase the amount of water in your bowels to assist with bowel movements.  Take as directed and can mix with a glass of water, juice, soda, coffee, or tea.  Take if you go more than two days without a movement. Do not use MiraLax more than once per day. Call your doctor if you are still constipated or irregular after using this medication for 7 days in a row.  If you continue to have problems with postoperative constipation, please contact the office for further assistance and recommendations.  If you experience "the worst abdominal pain ever" or develop nausea or vomiting, please contact the office  immediatly for further recommendations for treatment.   Take Xarelto for two and a half more weeks, then discontinue Xarelto. Once the patient has completed the Xarelto, they may resume the 81 mg Aspirin.   Do not sit on low chairs, stoools or toilet seats, as it may be difficult to get up from low surfaces    Complete by:  As directed    Driving restrictions    Complete by:  As directed    No driving until released by the physician.   Increase activity slowly as tolerated    Complete by:  As directed    Lifting restrictions    Complete by:  As directed    No lifting until released by the physician.   Patient may shower    Complete by:  As directed    You may shower without a dressing  once there is no drainage.  Do not wash over the wound.  If drainage remains, do not shower until drainage stops.   TED hose    Complete by:  As directed    Use stockings (TED hose) for 3 weeks on both leg(s).  You may remove them at night for sleeping.   Weight bearing as tolerated    Complete by:  As directed    Laterality:  left   Extremity:  Lower     Allergies as of 02/03/2017      Reactions   Robaxin [methocarbamol]    hiccups    Med Rec must be completed prior to using this Milton V, MD. Schedule an appointment as soon as possible for a visit on 02/15/2017.   Specialty:  Orthopedic Surgery Contact information: [address] [place] [address] [phone]           Signed: Arlee Muslim, PA-C Orthopaedic Surgery 02/03/2017, 9:32 AM

## 2017-02-03 NOTE — Progress Notes (Signed)
Physical Therapy Treatment Patient Details Name: Manuel Torres MRN: [id number] DOB: 02-Aug-1961 Today's Date: 02/03/2017    History of Present Illness Pt s/p L THR and with hx of R THR (posterior)    PT Comments    Pt motivated and progressing with mobility but continues to struggle with bed mobility.   Follow Up Recommendations  Home health PT     Equipment Recommendations  None recommended by PT    Recommendations for Other Services       Precautions / Restrictions Precautions Precautions: Fall Restrictions Weight Bearing Restrictions: No Other Position/Activity Restrictions: WBAT    Mobility  Bed Mobility Overal bed mobility: Needs Assistance Bed Mobility: Supine to Sit     Supine to sit: Min assist;Mod assist     General bed mobility comments: cues for sequence and use of R LE to self assist.  Physical assist to manage L  LE and to bring trunk to upright  Transfers Overall transfer level: Needs assistance Equipment used: Rolling walker (2 wheeled) Transfers: Sit to/from Stand Sit to Stand: Min assist;From elevated surface         General transfer comment: cues for LE management and use of UEs to self assist  Ambulation/Gait Ambulation/Gait assistance: Min assist Ambulation Distance (Feet): 73 Feet Assistive device: Rolling walker (2 wheeled) Gait Pattern/deviations: Step-to pattern;Decreased step length - right;Decreased step length - left;Shuffle;Trunk flexed Gait velocity: decr Gait velocity interpretation: Below normal speed for age/gender General Gait Details: cues for sequence, posture and position from [employer]            Wheelchair Mobility    Modified Rankin (Stroke Patients Only)       Balance                                    Cognition Arousal/Alertness: Awake/alert Behavior During Therapy: WFL for tasks assessed/performed Overall Cognitive Status: Within Functional Limits for tasks assessed                       Exercises Total Joint Exercises Ankle Circles/Pumps: AROM;Both;20 reps;Supine Quad Sets: AROM;Both;10 reps;Supine Heel Slides: AAROM;Left;20 reps;Supine Hip ABduction/ADduction: AAROM;Left;15 reps;Supine    General Comments        Pertinent Vitals/Pain Pain Assessment: 0-10 Pain Score: 5  Pain Location: L hip Pain Descriptors / Indicators: Aching;Sore Pain Intervention(s): Limited activity within patient's tolerance;Monitored during session;Premedicated before session;Ice applied;Patient requesting pain meds-RN notified    Home Living                      Prior Function            PT Goals (current goals can now be found in the care plan section) Acute Rehab PT Goals Patient Stated Goal: Regain IND PT Goal Formulation: With patient Time For Goal Achievement: 02/05/17 Potential to Achieve Goals: Good Progress towards PT goals: Progressing toward goals    Frequency    7X/week      PT Plan Current plan remains appropriate    Co-evaluation             End of Session Equipment Utilized During Treatment: Gait belt Activity Tolerance: Patient tolerated treatment well Patient left: in chair;with call bell/phone within reach;with chair alarm set Nurse Communication: Mobility status PT Visit Diagnosis: Difficulty in walking, not elsewhere classified (R26.2)     Time: 5784-6962 PT Time  Calculation (min) (ACUTE ONLY): 34 min  Charges:  $Gait Training: 8-22 mins $Therapeutic Exercise: 8-22 mins                    G Codes:       Jaskirat Zertuche 02-07-2017, 12:23 PM

## 2017-02-05 DIAGNOSIS — Z96643 Presence of artificial hip joint, bilateral: Secondary | ICD-10-CM | POA: Diagnosis not present

## 2017-02-05 DIAGNOSIS — M5136 Other intervertebral disc degeneration, lumbar region: Secondary | ICD-10-CM | POA: Diagnosis not present

## 2017-02-05 DIAGNOSIS — Z7901 Long term (current) use of anticoagulants: Secondary | ICD-10-CM | POA: Diagnosis not present

## 2017-02-05 DIAGNOSIS — E8881 Metabolic syndrome: Secondary | ICD-10-CM | POA: Diagnosis not present

## 2017-02-05 DIAGNOSIS — Z6838 Body mass index (BMI) 38.0-38.9, adult: Secondary | ICD-10-CM | POA: Diagnosis not present

## 2017-02-05 DIAGNOSIS — Z79891 Long term (current) use of opiate analgesic: Secondary | ICD-10-CM | POA: Diagnosis not present

## 2017-02-05 DIAGNOSIS — R7303 Prediabetes: Secondary | ICD-10-CM | POA: Diagnosis not present

## 2017-02-05 DIAGNOSIS — M48061 Spinal stenosis, lumbar region without neurogenic claudication: Secondary | ICD-10-CM | POA: Diagnosis not present

## 2017-02-05 DIAGNOSIS — Z471 Aftercare following joint replacement surgery: Secondary | ICD-10-CM | POA: Diagnosis not present

## 2017-02-05 DIAGNOSIS — I10 Essential (primary) hypertension: Secondary | ICD-10-CM | POA: Diagnosis not present

## 2017-02-07 DIAGNOSIS — M5136 Other intervertebral disc degeneration, lumbar region: Secondary | ICD-10-CM | POA: Diagnosis not present

## 2017-02-07 DIAGNOSIS — I10 Essential (primary) hypertension: Secondary | ICD-10-CM | POA: Diagnosis not present

## 2017-02-07 DIAGNOSIS — Z6838 Body mass index (BMI) 38.0-38.9, adult: Secondary | ICD-10-CM | POA: Diagnosis not present

## 2017-02-07 DIAGNOSIS — M48061 Spinal stenosis, lumbar region without neurogenic claudication: Secondary | ICD-10-CM | POA: Diagnosis not present

## 2017-02-07 DIAGNOSIS — Z96643 Presence of artificial hip joint, bilateral: Secondary | ICD-10-CM | POA: Diagnosis not present

## 2017-02-07 DIAGNOSIS — E8881 Metabolic syndrome: Secondary | ICD-10-CM | POA: Diagnosis not present

## 2017-02-07 DIAGNOSIS — R7303 Prediabetes: Secondary | ICD-10-CM | POA: Diagnosis not present

## 2017-02-07 DIAGNOSIS — Z471 Aftercare following joint replacement surgery: Secondary | ICD-10-CM | POA: Diagnosis not present

## 2017-02-07 DIAGNOSIS — Z79891 Long term (current) use of opiate analgesic: Secondary | ICD-10-CM | POA: Diagnosis not present

## 2017-02-07 DIAGNOSIS — Z7901 Long term (current) use of anticoagulants: Secondary | ICD-10-CM | POA: Diagnosis not present

## 2017-02-09 DIAGNOSIS — R7303 Prediabetes: Secondary | ICD-10-CM | POA: Diagnosis not present

## 2017-02-09 DIAGNOSIS — Z6838 Body mass index (BMI) 38.0-38.9, adult: Secondary | ICD-10-CM | POA: Diagnosis not present

## 2017-02-09 DIAGNOSIS — Z471 Aftercare following joint replacement surgery: Secondary | ICD-10-CM | POA: Diagnosis not present

## 2017-02-09 DIAGNOSIS — M48061 Spinal stenosis, lumbar region without neurogenic claudication: Secondary | ICD-10-CM | POA: Diagnosis not present

## 2017-02-09 DIAGNOSIS — I10 Essential (primary) hypertension: Secondary | ICD-10-CM | POA: Diagnosis not present

## 2017-02-09 DIAGNOSIS — E8881 Metabolic syndrome: Secondary | ICD-10-CM | POA: Diagnosis not present

## 2017-02-09 DIAGNOSIS — Z96643 Presence of artificial hip joint, bilateral: Secondary | ICD-10-CM | POA: Diagnosis not present

## 2017-02-09 DIAGNOSIS — Z7901 Long term (current) use of anticoagulants: Secondary | ICD-10-CM | POA: Diagnosis not present

## 2017-02-09 DIAGNOSIS — M5136 Other intervertebral disc degeneration, lumbar region: Secondary | ICD-10-CM | POA: Diagnosis not present

## 2017-02-09 DIAGNOSIS — Z79891 Long term (current) use of opiate analgesic: Secondary | ICD-10-CM | POA: Diagnosis not present

## 2017-02-11 DIAGNOSIS — M48061 Spinal stenosis, lumbar region without neurogenic claudication: Secondary | ICD-10-CM | POA: Diagnosis not present

## 2017-02-11 DIAGNOSIS — Z471 Aftercare following joint replacement surgery: Secondary | ICD-10-CM | POA: Diagnosis not present

## 2017-02-11 DIAGNOSIS — Z79891 Long term (current) use of opiate analgesic: Secondary | ICD-10-CM | POA: Diagnosis not present

## 2017-02-11 DIAGNOSIS — I10 Essential (primary) hypertension: Secondary | ICD-10-CM | POA: Diagnosis not present

## 2017-02-11 DIAGNOSIS — M5136 Other intervertebral disc degeneration, lumbar region: Secondary | ICD-10-CM | POA: Diagnosis not present

## 2017-02-11 DIAGNOSIS — Z6838 Body mass index (BMI) 38.0-38.9, adult: Secondary | ICD-10-CM | POA: Diagnosis not present

## 2017-02-11 DIAGNOSIS — R7303 Prediabetes: Secondary | ICD-10-CM | POA: Diagnosis not present

## 2017-02-11 DIAGNOSIS — Z96643 Presence of artificial hip joint, bilateral: Secondary | ICD-10-CM | POA: Diagnosis not present

## 2017-02-11 DIAGNOSIS — Z7901 Long term (current) use of anticoagulants: Secondary | ICD-10-CM | POA: Diagnosis not present

## 2017-02-11 DIAGNOSIS — E8881 Metabolic syndrome: Secondary | ICD-10-CM | POA: Diagnosis not present

## 2017-02-14 ENCOUNTER — Other Ambulatory Visit: Payer: Self-pay | Admitting: Family Medicine

## 2017-02-14 DIAGNOSIS — Z96643 Presence of artificial hip joint, bilateral: Secondary | ICD-10-CM | POA: Diagnosis not present

## 2017-02-14 DIAGNOSIS — M5136 Other intervertebral disc degeneration, lumbar region: Secondary | ICD-10-CM | POA: Diagnosis not present

## 2017-02-14 DIAGNOSIS — Z471 Aftercare following joint replacement surgery: Secondary | ICD-10-CM | POA: Diagnosis not present

## 2017-02-14 DIAGNOSIS — I10 Essential (primary) hypertension: Secondary | ICD-10-CM | POA: Diagnosis not present

## 2017-02-14 DIAGNOSIS — E8881 Metabolic syndrome: Secondary | ICD-10-CM | POA: Diagnosis not present

## 2017-02-14 DIAGNOSIS — Z79891 Long term (current) use of opiate analgesic: Secondary | ICD-10-CM | POA: Diagnosis not present

## 2017-02-14 DIAGNOSIS — M48061 Spinal stenosis, lumbar region without neurogenic claudication: Secondary | ICD-10-CM | POA: Diagnosis not present

## 2017-02-14 DIAGNOSIS — Z6838 Body mass index (BMI) 38.0-38.9, adult: Secondary | ICD-10-CM | POA: Diagnosis not present

## 2017-02-14 DIAGNOSIS — Z7901 Long term (current) use of anticoagulants: Secondary | ICD-10-CM | POA: Diagnosis not present

## 2017-02-14 DIAGNOSIS — R7303 Prediabetes: Secondary | ICD-10-CM | POA: Diagnosis not present

## 2017-02-15 ENCOUNTER — Other Ambulatory Visit: Payer: Self-pay | Admitting: Family Medicine

## 2017-02-15 ENCOUNTER — Telehealth: Payer: Self-pay | Admitting: Family Medicine

## 2017-02-15 MED ORDER — TRAZODONE HCL 50 MG PO TABS: 25.0000 mg | ORAL_TABLET | Freq: Every evening | ORAL | 3 refills | Status: DC | PRN

## 2017-02-15 NOTE — Telephone Encounter (Signed)
Please inform the patient that I send in 90 day supply with refills

## 2017-02-15 NOTE — Telephone Encounter (Signed)
Patient was prescribed Trazodone to help him sleep.  He said it is working well for him and would like to go ahead and have the 90 day supply called in.  [facility]

## 2017-02-15 NOTE — Telephone Encounter (Signed)
Notified patient.

## 2017-02-17 DIAGNOSIS — E8881 Metabolic syndrome: Secondary | ICD-10-CM | POA: Diagnosis not present

## 2017-02-17 DIAGNOSIS — Z471 Aftercare following joint replacement surgery: Secondary | ICD-10-CM | POA: Diagnosis not present

## 2017-02-17 DIAGNOSIS — Z6838 Body mass index (BMI) 38.0-38.9, adult: Secondary | ICD-10-CM | POA: Diagnosis not present

## 2017-02-17 DIAGNOSIS — I10 Essential (primary) hypertension: Secondary | ICD-10-CM | POA: Diagnosis not present

## 2017-02-17 DIAGNOSIS — M48061 Spinal stenosis, lumbar region without neurogenic claudication: Secondary | ICD-10-CM | POA: Diagnosis not present

## 2017-02-17 DIAGNOSIS — Z7901 Long term (current) use of anticoagulants: Secondary | ICD-10-CM | POA: Diagnosis not present

## 2017-02-17 DIAGNOSIS — M5136 Other intervertebral disc degeneration, lumbar region: Secondary | ICD-10-CM | POA: Diagnosis not present

## 2017-02-17 DIAGNOSIS — R7303 Prediabetes: Secondary | ICD-10-CM | POA: Diagnosis not present

## 2017-02-17 DIAGNOSIS — Z79891 Long term (current) use of opiate analgesic: Secondary | ICD-10-CM | POA: Diagnosis not present

## 2017-02-17 DIAGNOSIS — Z96643 Presence of artificial hip joint, bilateral: Secondary | ICD-10-CM | POA: Diagnosis not present

## 2017-02-28 ENCOUNTER — Ambulatory Visit: Payer: 59 | Admitting: Family Medicine

## 2017-03-08 DIAGNOSIS — Z471 Aftercare following joint replacement surgery: Secondary | ICD-10-CM | POA: Diagnosis not present

## 2017-03-08 DIAGNOSIS — Z96642 Presence of left artificial hip joint: Secondary | ICD-10-CM | POA: Diagnosis not present

## 2017-03-09 DIAGNOSIS — M47816 Spondylosis without myelopathy or radiculopathy, lumbar region: Secondary | ICD-10-CM | POA: Diagnosis not present

## 2017-03-09 DIAGNOSIS — Z6838 Body mass index (BMI) 38.0-38.9, adult: Secondary | ICD-10-CM | POA: Diagnosis not present

## 2017-03-09 DIAGNOSIS — I10 Essential (primary) hypertension: Secondary | ICD-10-CM | POA: Diagnosis not present

## 2017-03-09 DIAGNOSIS — M961 Postlaminectomy syndrome, not elsewhere classified: Secondary | ICD-10-CM | POA: Diagnosis not present

## 2017-04-01 ENCOUNTER — Ambulatory Visit: Payer: 59 | Admitting: Family Medicine

## 2017-04-21 DIAGNOSIS — M961 Postlaminectomy syndrome, not elsewhere classified: Secondary | ICD-10-CM | POA: Diagnosis not present

## 2017-05-13 ENCOUNTER — Other Ambulatory Visit (HOSPITAL_BASED_OUTPATIENT_CLINIC_OR_DEPARTMENT_OTHER): Payer: Self-pay

## 2017-05-13 DIAGNOSIS — R0683 Snoring: Secondary | ICD-10-CM

## 2017-05-13 DIAGNOSIS — G4710 Hypersomnia, unspecified: Secondary | ICD-10-CM

## 2017-05-13 DIAGNOSIS — R5383 Other fatigue: Secondary | ICD-10-CM

## 2017-06-01 ENCOUNTER — Ambulatory Visit: Payer: BLUE CROSS/BLUE SHIELD | Attending: Family Medicine | Admitting: Neurology

## 2017-06-01 DIAGNOSIS — G4733 Obstructive sleep apnea (adult) (pediatric): Secondary | ICD-10-CM | POA: Insufficient documentation

## 2017-06-01 DIAGNOSIS — R0683 Snoring: Secondary | ICD-10-CM | POA: Diagnosis present

## 2017-06-01 DIAGNOSIS — G4710 Hypersomnia, unspecified: Secondary | ICD-10-CM

## 2017-06-01 DIAGNOSIS — R5383 Other fatigue: Secondary | ICD-10-CM | POA: Diagnosis present

## 2017-06-10 NOTE — Procedures (Signed)
Green A. Merlene Laughter, MD     [email or web address]             NOCTURNAL POLYSOMNOGRAPHY   LOCATION: [facility]  Patient Name: Manuel Torres, Manuel Torres Date: 06/01/2017 Gender: Male D.O.B: 12-03-1960 Age (years): 56 Referring Provider: Sallee Lange Height (inches): 71 Interpreting Physician: Phillips Odor MD, ABSM Weight (lbs): 277 RPSGT: Peak, Robert BMI: 39 MRN: [id number] Neck Size: 17.50 CLINICAL INFORMATION Sleep Study Type: Split Night CPAP  Indication for sleep study: Excessive Daytime Sleepiness, Fatigue, Snoring  Epworth Sleepiness Score: 3  SLEEP STUDY TECHNIQUE As per the AASM Manual for the Scoring of Sleep and Associated Events v2.3 (April 2016) with a hypopnea requiring 4% desaturations.  The channels recorded and monitored were frontal, central and occipital EEG, electrooculogram (EOG), submentalis EMG (chin), nasal and oral airflow, thoracic and abdominal wall motion, anterior tibialis EMG, snore microphone, electrocardiogram, and pulse oximetry. Continuous positive airway pressure (CPAP) was initiated when the patient met split night criteria and was titrated according to treat sleep-disordered breathing.  MEDICATIONS Medications self-administered by patient taken the night of the study : N/A  Current Outpatient Prescriptions:  .  allopurinol (ZYLOPRIM) 300 MG tablet, Take 1 tablet (300 mg total) by mouth daily., Disp: 90 tablet, Rfl: 1 .  atorvastatin (LIPITOR) 20 MG tablet, Take 1 tablet (20 mg total) by mouth daily., Disp: 90 tablet, Rfl: 1 .  gabapentin (NEURONTIN) 300 MG capsule, Take 1 capsule (300 mg total) by mouth 3 (three) times daily as needed (for pain. (with the percocet))., Disp: 90 capsule, Rfl: 0 .  hydrochlorothiazide (HYDRODIURIL) 25 MG tablet, Take 1 tablet (25 mg total) by mouth daily., Disp: 90 tablet, Rfl: 1 .  hydrochlorothiazide (HYDRODIURIL) 25 MG tablet, take 1 tablet by mouth once daily, Disp: 30 tablet, Rfl: 5 .   lisinopril (PRINIVIL,ZESTRIL) 10 MG tablet, Take 1 tablet (10 mg total) by mouth daily., Disp: 90 tablet, Rfl: 1 .  lisinopril (PRINIVIL,ZESTRIL) 5 MG tablet, Take 1 tablet (5 mg total) by mouth daily., Disp: 30 tablet, Rfl: 5 .  lisinopril (PRINIVIL,ZESTRIL) 5 MG tablet, take 1 tablet by mouth once daily, Disp: 30 tablet, Rfl: 5 .  oxyCODONE (OXY IR/ROXICODONE) 5 MG immediate release tablet, Take 1-3 tablets (5-15 mg total) by mouth every 4 (four) hours as needed for moderate pain or severe pain., Disp: 120 tablet, Rfl: 0 .  potassium chloride SA (K-DUR,KLOR-CON) 20 MEQ tablet, Take 1 tablet (20 mEq total) by mouth every morning., Disp: 90 tablet, Rfl: 1 .  rivaroxaban (XARELTO) 10 MG TABS tablet, Take 1 tablet (10 mg total) by mouth daily with breakfast. Take Xarelto for two and a half more weeks following discharge from the hospital, then discontinue Xarelto. Once the patient has completed the Xarelto, they may resume the 81 mg Aspirin., Disp: 20 tablet, Rfl: 0 .  tetrahydrozoline 0.05 % ophthalmic solution, Place 1-2 drops into both eyes 3 (three) times daily as needed (for red/irritated eyes.)., Disp: , Rfl:  .  traMADol (ULTRAM) 50 MG tablet, Take 1-2 tablets (50-100 mg total) by mouth every 6 (six) hours as needed for moderate pain., Disp: 56 tablet, Rfl: 0 .  traZODone (DESYREL) 50 MG tablet, Take 0.5-1 tablets (25-50 mg total) by mouth at bedtime as needed for sleep., Disp: 90 tablet, Rfl: 3   RESPIRATORY PARAMETERS Diagnostic  Total AHI (/hr): 22.3 RDI (/hr): 53.9 OA Index (/hr): 4.2 CA Index (/hr): 1.3 REM AHI (/hr): 84.7 NREM AHI (/hr): 18.4 Supine AHI (/hr): 13.9 Non-supine AHI (/  hr): 30.20 Min O2 Sat (%): 86.00 Mean O2 (%): 94.78 Time below 88% (min): 0.8   Titration  Optimal Pressure (cm): 12 AHI at Optimal Pressure (/hr): 0.8 Min O2 at Optimal Pressure (%): 89.0 Supine % at Optimal (%): 100 Sleep % at Optimal (%): 94   SLEEP ARCHITECTURE The recording time for the entire night  was 468.0 minutes.  During a baseline period of 230.2 minutes, the patient slept for 142.5 minutes in REM and nonREM, yielding a sleep efficiency of 61.9%. Sleep onset after lights out was 39.9 minutes with a REM latency of 104.5 minutes. The patient spent 19.65% of the night in stage N1 sleep, 74.39% in stage N2 sleep, 0.00% in stage N3 and 5.96% in REM.  During the titration period of 229.1 minutes, the patient slept for 181.0 minutes in REM and nonREM, yielding a sleep efficiency of 79.0%. Sleep onset after CPAP initiation was 39.1 minutes with a REM latency of 36.0 minutes. The patient spent 8.30% of the night in stage N1 sleep, 65.74% in stage N2 sleep, 0.00% in stage N3 and 25.96% in REM.  CARDIAC DATA The 2 lead EKG demonstrated sinus rhythm. The mean heart rate was 48.07 beats per minute. Other EKG findings include: None. LEG MOVEMENT DATA The total Periodic Limb Movements of Sleep (PLMS) were 0. The PLMS index was 0.00.  IMPRESSIONS - Moderate obstructive sleep apnea occurred during the diagnostic portion of the study(AHI = 22.3/hour). An optimal CPAP of 12 is selected for this patient ( 12 cm of water). - Absent slow-wave sleep is observed.   Delano Metz, MD Diplomate, American Board of Sleep Medicine.    ELECTRONICALLY SIGNED ON:  06/10/2017, 4:19 PM [place] PH: [phone]   FX: [phone] [place]

## 2017-06-15 ENCOUNTER — Telehealth: Payer: Self-pay | Admitting: Family Medicine

## 2017-06-15 NOTE — Telephone Encounter (Signed)
Order was signed thank you 

## 2017-06-15 NOTE — Telephone Encounter (Signed)
Please sign & date sleep study order in red folder in yellow box  Please forward to [facility] to be faxed back

## 2017-06-16 NOTE — Telephone Encounter (Signed)
Order faxed.

## 2017-07-17 IMAGING — DX DG LUMBAR SPINE COMPLETE 4+V
5 series · 5 of 5 positions shown · non-contrast
Comparison: MRI of the lumbar spine March 15, 2007.

CLINICAL DATA: Low back pain radiating into the left hip and into
both legs, no known injury

EXAM:
LUMBAR SPINE - COMPLETE 4+ VIEW; DG HIP (WITH OR WITHOUT PELVIS)
2-3V LEFT

[l-spine ap]
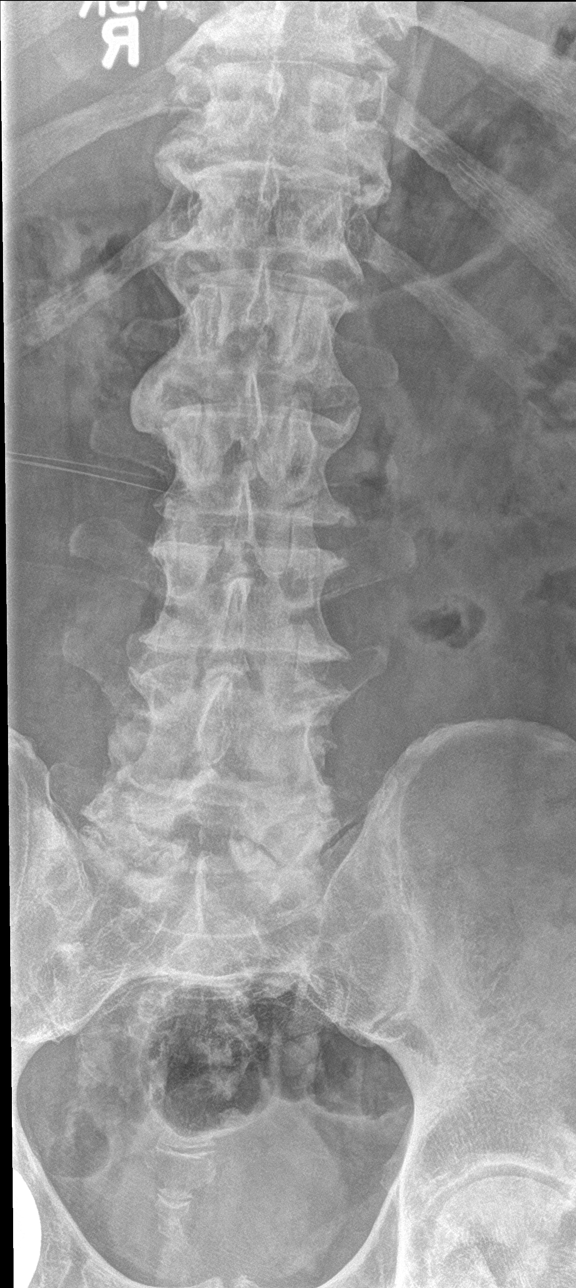

[l-spine obl (1 of 2)]
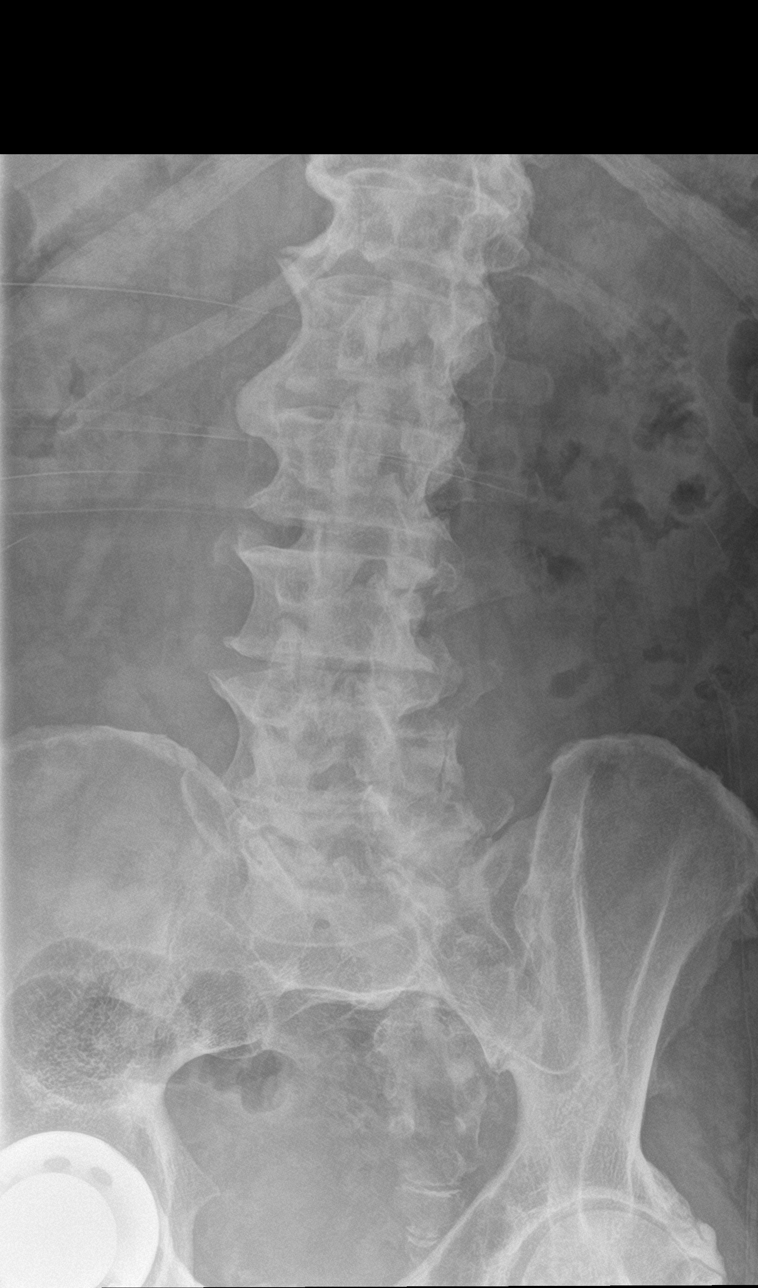

[l-spine obl (2 of 2)]
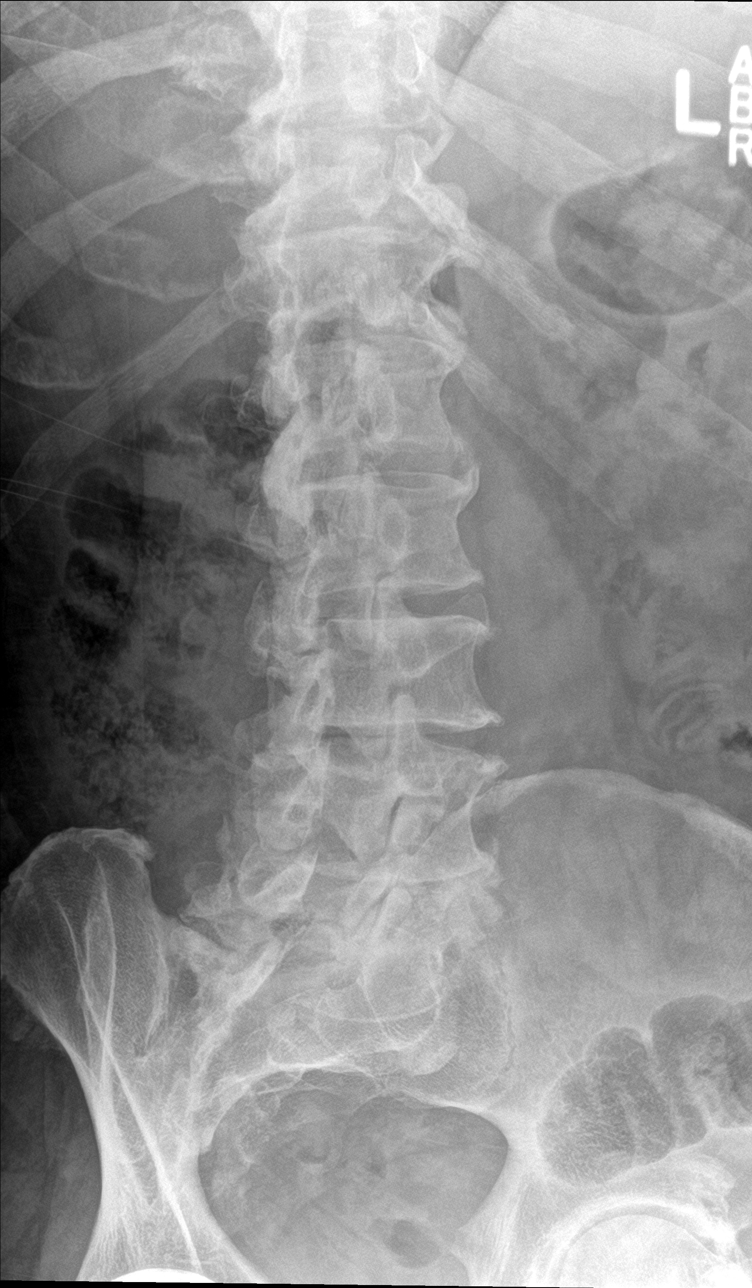

[l-spine lat]
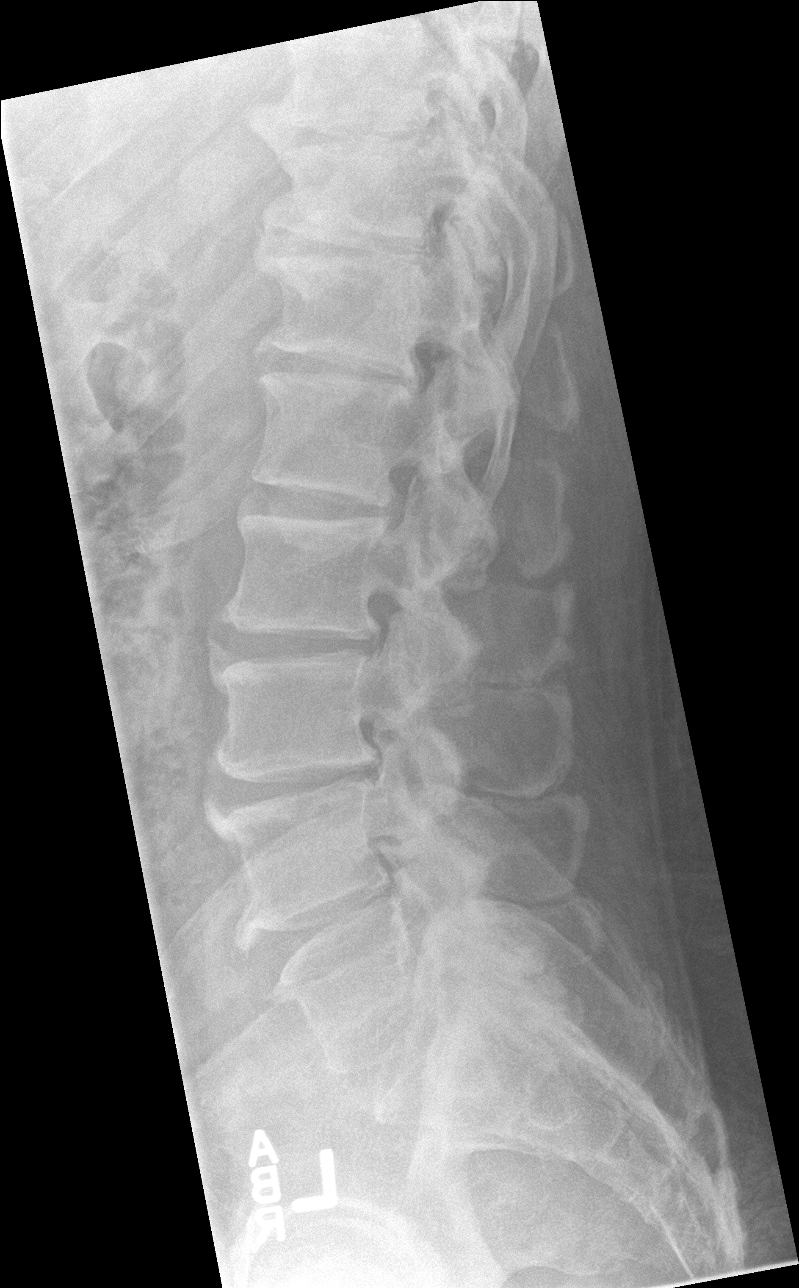

[l-spine spot]
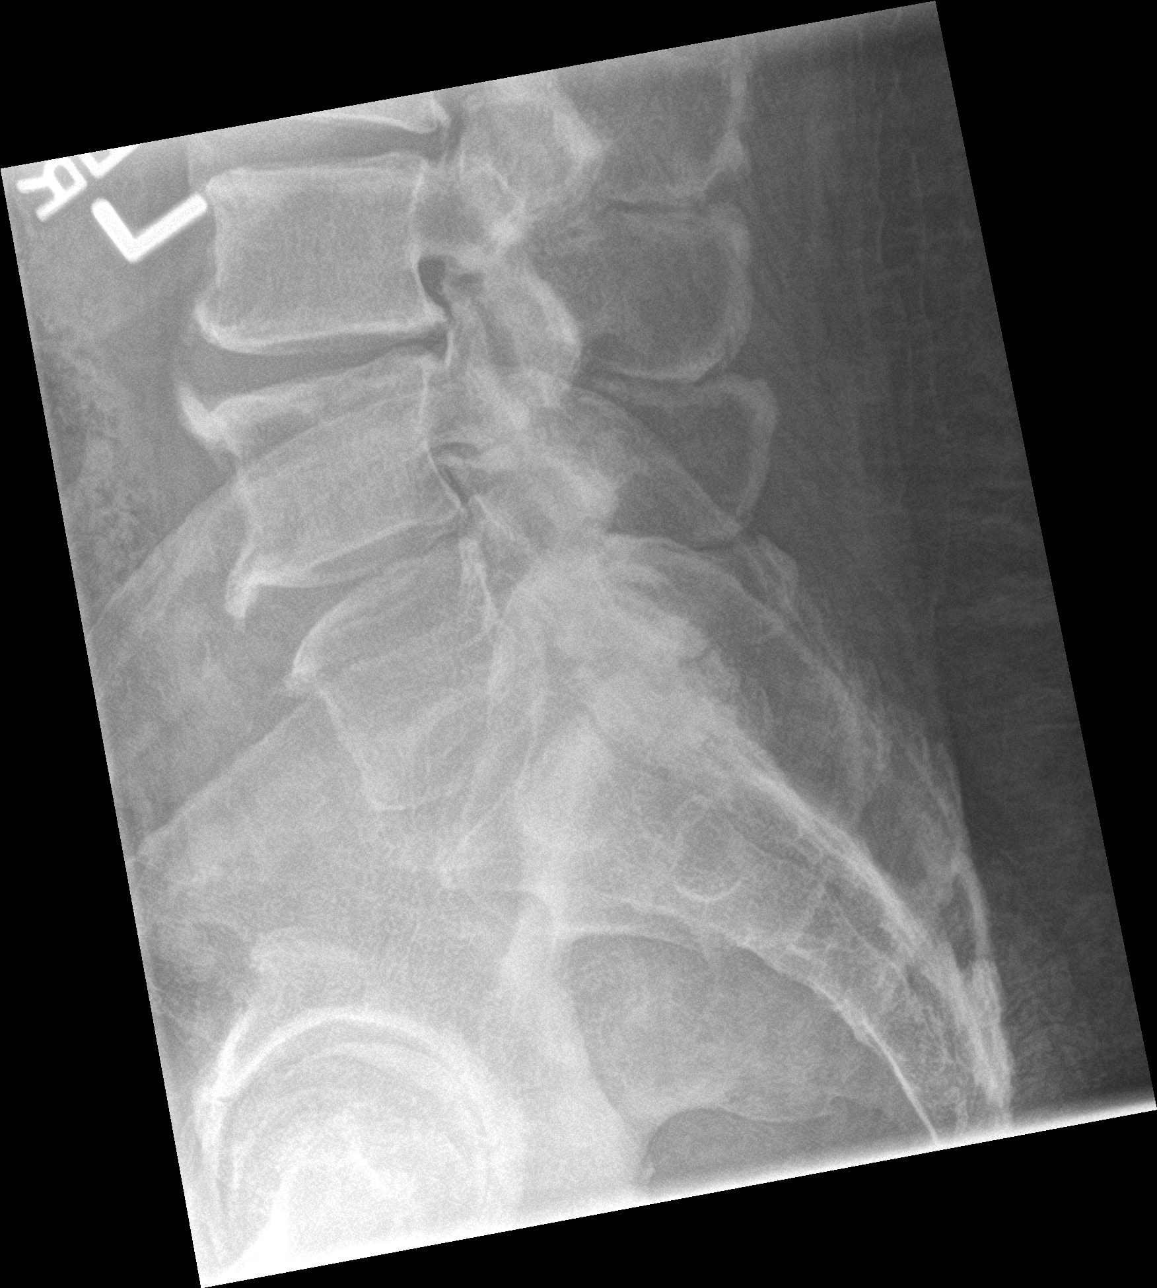

[5 of 5 positions shown; findings below may reference images not displayed]

FINDINGS: Lumbar Spine: The lumbar vertebral bodies are preserved in height.
There is disc space narrowing at L3-4 and T12-L1. There are large
anterior endplate osteophytes from L3 inferiorly and also in the
lower thoracic spine. There is no spondylolisthesis. There is facet
joint hypertrophy at L4-5 and at L5-S1. The pedicles and transverse
processes are intact. No definite pars defects are observed.

Left hip: The patient has undergone previous right hip joint
replacement. There is moderate joint space narrowing and articular
surface eburnation of the left hip joint. The left femoral neck and
intertrochanteric and subtrochanteric regions are normal. The bony
pelvis exhibits no acute abnormalities.
IMPRESSION: 1. There is no compression fracture. There is multilevel mild to
moderate degenerative disc disease in the lower lumbar and in the
lower thoracic spine. There is facet joint hypertrophy in the lower
lumbar spine. Given the patient's lower extremity radicular
symptoms, lumbar spine MRI may be useful.
2. Moderate osteoarthritic change of the left hip. Normal appearance
of the prosthetic right hip. No acute abnormality of the bony
pelvis.

## 2017-07-20 ENCOUNTER — Other Ambulatory Visit: Payer: Self-pay | Admitting: Family Medicine

## 2017-07-25 DIAGNOSIS — M48062 Spinal stenosis, lumbar region with neurogenic claudication: Secondary | ICD-10-CM | POA: Diagnosis not present

## 2017-07-25 DIAGNOSIS — M961 Postlaminectomy syndrome, not elsewhere classified: Secondary | ICD-10-CM | POA: Diagnosis not present

## 2017-07-26 ENCOUNTER — Telehealth: Payer: Self-pay | Admitting: *Deleted

## 2017-07-26 ENCOUNTER — Ambulatory Visit (INDEPENDENT_AMBULATORY_CARE_PROVIDER_SITE_OTHER): Payer: BLUE CROSS/BLUE SHIELD | Admitting: Family Medicine

## 2017-07-26 ENCOUNTER — Encounter: Payer: Self-pay | Admitting: Family Medicine

## 2017-07-26 VITALS — BP 132/86 | Ht 71.0 in | Wt 272.8 lb

## 2017-07-26 DIAGNOSIS — Z125 Encounter for screening for malignant neoplasm of prostate: Secondary | ICD-10-CM

## 2017-07-26 DIAGNOSIS — I10 Essential (primary) hypertension: Secondary | ICD-10-CM

## 2017-07-26 DIAGNOSIS — R7303 Prediabetes: Secondary | ICD-10-CM

## 2017-07-26 DIAGNOSIS — Z23 Encounter for immunization: Secondary | ICD-10-CM

## 2017-07-26 DIAGNOSIS — D509 Iron deficiency anemia, unspecified: Secondary | ICD-10-CM

## 2017-07-26 DIAGNOSIS — N529 Male erectile dysfunction, unspecified: Secondary | ICD-10-CM | POA: Diagnosis not present

## 2017-07-26 DIAGNOSIS — E7849 Other hyperlipidemia: Secondary | ICD-10-CM

## 2017-07-26 DIAGNOSIS — M109 Gout, unspecified: Secondary | ICD-10-CM

## 2017-07-26 DIAGNOSIS — E784 Other hyperlipidemia: Secondary | ICD-10-CM | POA: Diagnosis not present

## 2017-07-26 DIAGNOSIS — G4733 Obstructive sleep apnea (adult) (pediatric): Secondary | ICD-10-CM

## 2017-07-26 DIAGNOSIS — R748 Abnormal levels of other serum enzymes: Secondary | ICD-10-CM

## 2017-07-26 DIAGNOSIS — G4730 Sleep apnea, unspecified: Secondary | ICD-10-CM | POA: Insufficient documentation

## 2017-07-26 MED ORDER — ATORVASTATIN CALCIUM 20 MG PO TABS: 20.0000 mg | ORAL_TABLET | Freq: Every day | ORAL | 1 refills | Status: DC

## 2017-07-26 MED ORDER — POTASSIUM CHLORIDE CRYS ER 20 MEQ PO TBCR: 20.0000 meq | EXTENDED_RELEASE_TABLET | Freq: Every morning | ORAL | 1 refills | Status: DC

## 2017-07-26 MED ORDER — ALLOPURINOL 300 MG PO TABS: 300.0000 mg | ORAL_TABLET | Freq: Every day | ORAL | 1 refills | Status: DC

## 2017-07-26 MED ORDER — SILDENAFIL CITRATE 20 MG PO TABS: ORAL_TABLET | ORAL | 3 refills | Status: DC

## 2017-07-26 MED ORDER — HYDROCHLOROTHIAZIDE 25 MG PO TABS: 25.0000 mg | ORAL_TABLET | Freq: Every day | ORAL | 1 refills | Status: DC

## 2017-07-26 MED ORDER — LISINOPRIL 10 MG PO TABS: 10.0000 mg | ORAL_TABLET | Freq: Every day | ORAL | 1 refills | Status: DC

## 2017-07-26 NOTE — Telephone Encounter (Signed)
Pt seen today. Needs rx for cpap machine. Sleep study on 06/01/17. Settings on report per dr Nicki Reaper. Please do rx and fax for pt per dr scott. Pt states he does not know where he gets equipment from.  Thanks.

## 2017-07-26 NOTE — Progress Notes (Signed)
   Subjective:    Patient ID: Manuel Torres, male    DOB: 1961/09/11, 56 y.o.   MRN: [id number]  Hypertension  This is a chronic problem. The current episode started more than 1 year ago. Pertinent negatives include no chest pain or headaches. Risk factors for coronary artery disease include obesity and male gender. Treatments tried: hctz, lisinopril. There are no compliance problems.    Had back injectilns yesterdayPatient has ongoing back troubles. There are 20 injections main of having to do surgery  Hip [employer] he had hip surgery earlier this year is working out at [employer] on a regular basis he is getting better range of motion  Pain management he has a pain management doctor working with him oxycodone twice a day to help him with his pain  Sleep apnea patient suffers with sleep apnea been present for months had sleep study in July need CPAP machine causes a lot of daytime fatigue tiredness  Hyperlipidemia watch his diet tries to keep it controlled with medication  Gout intermittent flareups tries to control it with medication  Has not had any flareups of gout recently. Watch his diet  Patient with erectile dysfunction significant issues over the past month is interested in trying something help this out.  40 minutes spent with patient today greater than half was in discussion of multiple different issues including a detailed discussion regarding sleep apnea and erectile dysfunction as well as management of hyperlipidemia hypertension gout and discussion of insomnia with his trazodone use Review of Systems  Constitutional: Negative for activity change, appetite change and fatigue.  HENT: Negative for congestion.   Respiratory: Negative for cough.   Cardiovascular: Negative for chest pain.  Gastrointestinal: Negative for abdominal pain.  Endocrine: Negative for polydipsia and polyphagia.  Genitourinary: Negative for difficulty urinating and dysuria.  Musculoskeletal: Positive for  arthralgias, back pain and gait problem.  Neurological: Negative for dizziness, weakness and headaches.  Psychiatric/Behavioral: Negative for confusion.       Objective:   Physical Exam  Constitutional: He appears well-nourished. No distress.  HENT:  Head: Normocephalic and atraumatic.  Right Ear: External ear normal.  Left Ear: External ear normal.  Eyes: Right eye exhibits no discharge. Left eye exhibits no discharge. No scleral icterus.  Neck: No JVD present. No tracheal deviation present.  Cardiovascular: Normal rate, regular rhythm and normal heart sounds.   No murmur heard. Pulmonary/Chest: Effort normal and breath sounds normal. No stridor. No respiratory distress. He has no wheezes. He has no rales.  Musculoskeletal: He exhibits no edema.  Lymphadenopathy:    He has no cervical adenopathy.  Neurological: He is alert. Coordination normal.  Skin: Skin is warm and dry. No rash noted. No erythema. No pallor.  Psychiatric: He has a normal mood and affect. His behavior is normal.  Vitals reviewed.         Assessment & Plan:  HTN good control with medication continue current medicine watch diet  Morbid obesity cup back on eating increase activity try to lose weight  Prediabetes check A1c watch diet closely minimize starches  Hyperlipidemia continue current medication previous labs reviewed Labs ordered  Sleep apnea recommend CPAP machine will move forward with this  Erectile dysfunction sildenafil explained prescription given  Anemia from earlier this year when he had surgery check CBC to make sure it's doing better  Screening for prostate cancer PSA wellness exam on next follow-up  Gout-under good control check uric acid level  Flu vaccine today

## 2017-07-27 DIAGNOSIS — I10 Essential (primary) hypertension: Secondary | ICD-10-CM | POA: Diagnosis not present

## 2017-07-27 DIAGNOSIS — Z125 Encounter for screening for malignant neoplasm of prostate: Secondary | ICD-10-CM | POA: Diagnosis not present

## 2017-07-27 DIAGNOSIS — E784 Other hyperlipidemia: Secondary | ICD-10-CM | POA: Diagnosis not present

## 2017-07-27 DIAGNOSIS — R7303 Prediabetes: Secondary | ICD-10-CM | POA: Diagnosis not present

## 2017-07-27 DIAGNOSIS — D509 Iron deficiency anemia, unspecified: Secondary | ICD-10-CM | POA: Diagnosis not present

## 2017-07-28 LAB — HEPATIC FUNCTION PANEL
ALK PHOS: 81 IU/L (ref 39–117)
ALT: 65 IU/L — AB (ref 0–44)
AST: 76 IU/L — ABNORMAL HIGH (ref 0–40)
Albumin: 3.9 g/dL (ref 3.5–5.5)
BILIRUBIN TOTAL: 0.3 mg/dL (ref 0.0–1.2)
BILIRUBIN, DIRECT: 0.09 mg/dL (ref 0.00–0.40)
TOTAL PROTEIN: 6.8 g/dL (ref 6.0–8.5)

## 2017-07-28 LAB — LIPID PANEL
CHOL/HDL RATIO: 3.8 ratio (ref 0.0–5.0)
Cholesterol, Total: 154 mg/dL (ref 100–199)
HDL: 41 mg/dL (ref >39)
LDL Calculated: 97 mg/dL (ref 0–99)
TRIGLYCERIDES: 78 mg/dL (ref 0–149)
VLDL Cholesterol Cal: 16 mg/dL (ref 5–40)

## 2017-07-28 LAB — CBC WITH DIFFERENTIAL/PLATELET
BASOS: 0 %
Basophils Absolute: 0 10*3/uL (ref 0.0–0.2)
EOS (ABSOLUTE): 0.1 10*3/uL (ref 0.0–0.4)
Eos: 1 %
Hematocrit: 39.2 % (ref 37.5–51.0)
Hemoglobin: 13.5 g/dL (ref 13.0–17.7)
IMMATURE GRANS (ABS): 0 10*3/uL (ref 0.0–0.1)
IMMATURE GRANULOCYTES: 0 %
LYMPHS: 46 %
Lymphocytes Absolute: 3.3 10*3/uL — ABNORMAL HIGH (ref 0.7–3.1)
MCH: 31.9 pg (ref 26.6–33.0)
MCHC: 34.4 g/dL (ref 31.5–35.7)
MCV: 93 fL (ref 79–97)
MONOS ABS: 0.7 10*3/uL (ref 0.1–0.9)
Monocytes: 9 %
NEUTROS PCT: 44 %
Neutrophils Absolute: 3.2 10*3/uL (ref 1.4–7.0)
PLATELETS: 294 10*3/uL (ref 150–379)
RBC: 4.23 x10E6/uL (ref 4.14–5.80)
RDW: 14.8 % (ref 12.3–15.4)
WBC: 7.3 10*3/uL (ref 3.4–10.8)

## 2017-07-28 LAB — MICROALBUMIN / CREATININE URINE RATIO
CREATININE, UR: 199.9 mg/dL
MICROALBUM., U, RANDOM: 4.3 ug/mL
Microalb/Creat Ratio: 2.2 mg/g creat (ref 0.0–30.0)

## 2017-07-28 LAB — BASIC METABOLIC PANEL
BUN / CREAT RATIO: 14 (ref 9–20)
BUN: 12 mg/dL (ref 6–24)
CO2: 23 mmol/L (ref 20–29)
CREATININE: 0.83 mg/dL (ref 0.76–1.27)
Calcium: 9.1 mg/dL (ref 8.7–10.2)
Chloride: 103 mmol/L (ref 96–106)
GFR calc non Af Amer: 98 mL/min/{1.73_m2} (ref >59)
GFR, EST AFRICAN AMERICAN: 113 mL/min/{1.73_m2} (ref >59)
Glucose: 109 mg/dL — ABNORMAL HIGH (ref 65–99)
Potassium: 4.4 mmol/L (ref 3.5–5.2)
SODIUM: 141 mmol/L (ref 134–144)

## 2017-07-28 LAB — HEMOGLOBIN A1C
Est. average glucose Bld gHb Est-mCnc: 126 mg/dL
Hgb A1c MFr Bld: 6 % — ABNORMAL HIGH (ref 4.8–5.6)

## 2017-07-28 LAB — PSA: PROSTATE SPECIFIC AG, SERUM: 0.7 ng/mL (ref 0.0–4.0)

## 2017-07-28 LAB — URIC ACID: Uric Acid: 6.3 mg/dL (ref 3.7–8.6)

## 2017-07-29 ENCOUNTER — Encounter: Payer: Self-pay | Admitting: Family Medicine

## 2017-07-29 ENCOUNTER — Telehealth: Payer: Self-pay | Admitting: Family Medicine

## 2017-07-29 NOTE — Telephone Encounter (Signed)
Patient had an ultrasound in July 2017 recommended a follow-up ultrasound 5 years which be 2022-nurses to be on the safe side lips have the patient do a follow-up aortic ultrasound in July 2021-put this in the electronic tickler file please

## 2017-07-29 NOTE — Telephone Encounter (Signed)
Order sent to Dr. Nicki Reaper for signature

## 2017-07-29 NOTE — Addendum Note (Signed)
Addended by: Gustavus Bryant L on: 07/29/2017 10:00 AM   Modules accepted: Orders

## 2017-07-29 NOTE — Telephone Encounter (Signed)
Pt added to the electronic tickler file for repeat aortic ultrasound in July 2021

## 2017-08-01 NOTE — Telephone Encounter (Signed)
Faxed order for CPAP & supplies along with demographics, insurance information, OV notes & sleep study results to [facility]  CPAP setting 12cmH2O  LMOM to notify pt that order was faxed & they'll contact pt to set up

## 2017-08-02 DIAGNOSIS — G4733 Obstructive sleep apnea (adult) (pediatric): Secondary | ICD-10-CM | POA: Diagnosis not present

## 2017-08-04 ENCOUNTER — Ambulatory Visit (INDEPENDENT_AMBULATORY_CARE_PROVIDER_SITE_OTHER): Payer: 59 | Admitting: Internal Medicine

## 2017-08-10 ENCOUNTER — Ambulatory Visit (INDEPENDENT_AMBULATORY_CARE_PROVIDER_SITE_OTHER): Payer: BLUE CROSS/BLUE SHIELD | Admitting: Internal Medicine

## 2017-08-10 ENCOUNTER — Encounter (INDEPENDENT_AMBULATORY_CARE_PROVIDER_SITE_OTHER): Payer: Self-pay | Admitting: Internal Medicine

## 2017-08-10 VITALS — BP 170/72 | HR 68 | Temp 98.2°F | Ht 71.0 in | Wt 274.4 lb

## 2017-08-10 DIAGNOSIS — R748 Abnormal levels of other serum enzymes: Secondary | ICD-10-CM | POA: Diagnosis not present

## 2017-08-10 DIAGNOSIS — K760 Fatty (change of) liver, not elsewhere classified: Secondary | ICD-10-CM

## 2017-08-10 HISTORY — DX: Fatty (change of) liver, not elsewhere classified: K76.0

## 2017-08-10 NOTE — Patient Instructions (Signed)
OV in 6 months. 

## 2017-08-10 NOTE — Progress Notes (Signed)
Subjective:    Patient ID: Manuel Torres, male    DOB: Mar 02, 1961, 56 y.o.   MRN: [id number]  HPI Referredf  By Dr. Wolfgang Phoenix for elevated liver enzymes. Hx of same. Elevated at least since 2011.  Admits to drinking etoh 2 beers a weeks. Stated before he had last blood drawn, he had drank about 7-8 beers and a couple shots of etoh. He is trying to lose weight. He is going to the [facility].  Had left hip replacement  in March.  He thinks he may have lost a few pounds. He is not on a low fat diet on a regular basis.  05/30/2016 [place] with Elastrography: elvated LFTs Corresponding Metavir fibrosis score is some F3 and F4.  06/21/2016 SMA: negative Hepatitis C antibody negative.  Hepatitis B surface angitgen negative. Cerulopasmin: 33.9 ANA positive  sedrate 19 Ferritin 429 Iron 118, TIBC 273 UIBC 155 Iron sat 43.     Hepatic Function Latest Ref Rng & Units 07/27/2017 02/02/2017 01/25/2017  Total Protein 6.0 - 8.5 g/dL 6.8 7.6 7.3  Albumin 3.5 - 5.5 g/dL 3.9 3.9 4.4  AST 0 - 40 IU/L 76(H) 50(H) 45(H)  ALT 0 - 44 IU/L 65(H) 41 49(H)  Alk Phosphatase 39 - 117 IU/L 81 63 81  Total Bilirubin 0.0 - 1.2 mg/dL 0.3 0.8 0.4  Bilirubin, Direct 0.00 - 0.40 mg/dL 0.09 - 0.11       Review of Systems Past Medical History:  Diagnosis Date  . Arthritis    Hip  . Fatty liver 08/10/2017  . Gout    NO RECENT FLARE UPS  . Gout   . Hypercholesteremia   . Hypertension     Past Surgical History:  Procedure Laterality Date  . BACK SURGERY     April 22, 2016, laminectomy   . COLONOSCOPY  08/03/2012   Procedure: COLONOSCOPY;  Surgeon: Rogene Houston, MD;  Location: [facility];  Service: Endoscopy;  Laterality: N/A;  830  . JOINT REPLACEMENT     r hip Dr. Maureen Ralphs Dec 2013  . right inguinal hernia    . TOTAL HIP ARTHROPLASTY  10/23/2012   Procedure: TOTAL HIP ARTHROPLASTY;  Surgeon: Gearlean Alf, MD;  Location: [facility];  Service: Orthopedics;  Laterality: Right;  . TOTAL HIP ARTHROPLASTY Left  02/02/2017   Procedure: LEFT TOTAL HIP ARTHROPLASTY ANTERIOR APPROACH;  Surgeon: Gaynelle Arabian, MD;  Location: [facility];  Service: Orthopedics;  Laterality: Left;    Allergies  Allergen Reactions  . Robaxin [Methocarbamol]     hiccups    Current Outpatient Prescriptions on File Prior to Visit  Medication Sig Dispense Refill  . allopurinol (ZYLOPRIM) 300 MG tablet Take 1 tablet (300 mg total) by mouth daily. 90 tablet 1  . atorvastatin (LIPITOR) 20 MG tablet Take 1 tablet (20 mg total) by mouth daily. 90 tablet 1  . gabapentin (NEURONTIN) 300 MG capsule Take 1 capsule (300 mg total) by mouth 3 (three) times daily as needed (for pain. (with the percocet)). (Patient taking differently: Take 300 mg by mouth 2 (two) times daily. ) 90 capsule 0  . hydrochlorothiazide (HYDRODIURIL) 25 MG tablet Take 1 tablet (25 mg total) by mouth daily. 90 tablet 1  . lisinopril (PRINIVIL,ZESTRIL) 10 MG tablet Take 1 tablet (10 mg total) by mouth daily. 90 tablet 1  . oxyCODONE (OXY IR/ROXICODONE) 5 MG immediate release tablet Take 1-3 tablets (5-15 mg total) by mouth every 4 (four) hours as needed for moderate pain or  severe pain. 120 tablet 0  . potassium chloride SA (KLOR-CON M20) 20 MEQ tablet Take 1 tablet (20 mEq total) by mouth every morning. 90 tablet 1  . sildenafil (REVATIO) 20 MG tablet 3 to 5 before relations 50 tablet 3  . traZODone (DESYREL) 50 MG tablet Take 0.5-1 tablets (25-50 mg total) by mouth at bedtime as needed for sleep. 90 tablet 3   No current facility-administered medications on file prior to visit.         Objective:   Physical Exam Blood pressure (!) 170/72, pulse 68, temperature 98.2 F (36.8 C), height '5\' 11"'  (1.803 m), weight 274 lb 6.4 oz (124.5 kg). Alert and oriented. Skin warm and dry. Oral mucosa is moist.   . Sclera anicteric, conjunctivae is pink. Thyroid not enlarged. No cervical lymphadenopathy. Lungs clear. Heart regular rate and rhythm.  Abdomen is soft. Bowel sounds  are positive. No hepatomegaly. No abdominal masses felt. No tenderness.  No edema to lower extremities.  .        Assessment & Plan:  Elevated liver enzymes. Fatty liver. Hepatic today. OV in 6 months. Hepatic 3 months. Hepatic 6 months.

## 2017-08-11 ENCOUNTER — Other Ambulatory Visit (INDEPENDENT_AMBULATORY_CARE_PROVIDER_SITE_OTHER): Payer: Self-pay | Admitting: Internal Medicine

## 2017-08-11 ENCOUNTER — Other Ambulatory Visit (INDEPENDENT_AMBULATORY_CARE_PROVIDER_SITE_OTHER): Payer: Self-pay | Admitting: *Deleted

## 2017-08-11 DIAGNOSIS — R748 Abnormal levels of other serum enzymes: Secondary | ICD-10-CM

## 2017-08-11 DIAGNOSIS — K760 Fatty (change of) liver, not elsewhere classified: Secondary | ICD-10-CM | POA: Diagnosis not present

## 2017-08-12 LAB — AMBIG ABBREV HFP7 DEFAULT

## 2017-08-12 LAB — HEPATIC FUNCTION PANEL
ALBUMIN: 4 g/dL (ref 3.5–5.5)
ALK PHOS: 86 IU/L (ref 39–117)
ALT: 31 IU/L (ref 0–44)
AST: 38 IU/L (ref 0–40)
BILIRUBIN TOTAL: 0.4 mg/dL (ref 0.0–1.2)
BILIRUBIN, DIRECT: 0.11 mg/dL (ref 0.00–0.40)
TOTAL PROTEIN: 6.6 g/dL (ref 6.0–8.5)

## 2017-08-31 DIAGNOSIS — M47816 Spondylosis without myelopathy or radiculopathy, lumbar region: Secondary | ICD-10-CM | POA: Diagnosis not present

## 2017-08-31 DIAGNOSIS — Z6837 Body mass index (BMI) 37.0-37.9, adult: Secondary | ICD-10-CM | POA: Diagnosis not present

## 2017-08-31 DIAGNOSIS — I10 Essential (primary) hypertension: Secondary | ICD-10-CM | POA: Diagnosis not present

## 2017-08-31 DIAGNOSIS — M961 Postlaminectomy syndrome, not elsewhere classified: Secondary | ICD-10-CM | POA: Diagnosis not present

## 2017-09-01 DIAGNOSIS — G4733 Obstructive sleep apnea (adult) (pediatric): Secondary | ICD-10-CM | POA: Diagnosis not present

## 2017-10-02 DIAGNOSIS — G4733 Obstructive sleep apnea (adult) (pediatric): Secondary | ICD-10-CM | POA: Diagnosis not present

## 2017-10-10 ENCOUNTER — Encounter (INDEPENDENT_AMBULATORY_CARE_PROVIDER_SITE_OTHER): Payer: Self-pay | Admitting: *Deleted

## 2017-10-10 ENCOUNTER — Other Ambulatory Visit (INDEPENDENT_AMBULATORY_CARE_PROVIDER_SITE_OTHER): Payer: Self-pay | Admitting: *Deleted

## 2017-10-10 DIAGNOSIS — R748 Abnormal levels of other serum enzymes: Secondary | ICD-10-CM

## 2017-10-28 DIAGNOSIS — M5416 Radiculopathy, lumbar region: Secondary | ICD-10-CM | POA: Diagnosis not present

## 2017-10-28 DIAGNOSIS — M5136 Other intervertebral disc degeneration, lumbar region: Secondary | ICD-10-CM | POA: Diagnosis not present

## 2017-11-01 DIAGNOSIS — G4733 Obstructive sleep apnea (adult) (pediatric): Secondary | ICD-10-CM | POA: Diagnosis not present

## 2017-11-21 IMAGING — US US EXTREM LOW VENOUS BILAT
1 series · 13 of 24 positions shown · non-contrast
Comparison: None.

CLINICAL DATA: Bilateral lower extremity weakness, elevated
D-dimer, fell [REDACTED]



[Series 1: us extrem low venous bilat · 0.08mm/px · 13 of 85 slices shown]
[im 1/85]
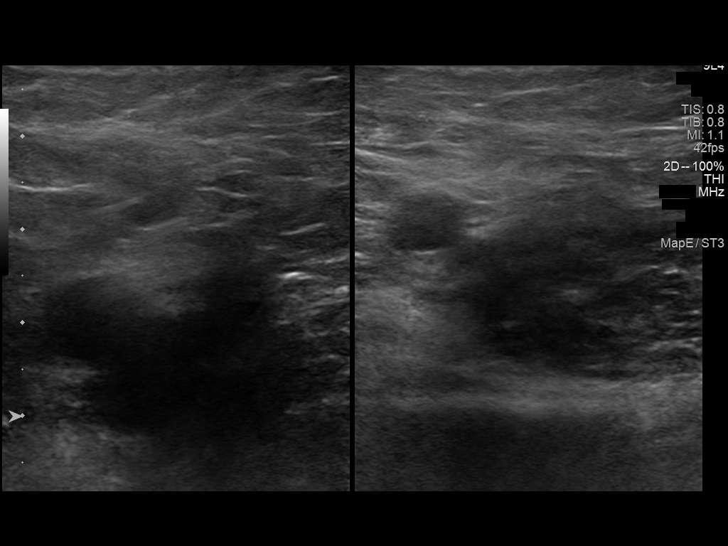
[im 8/85]
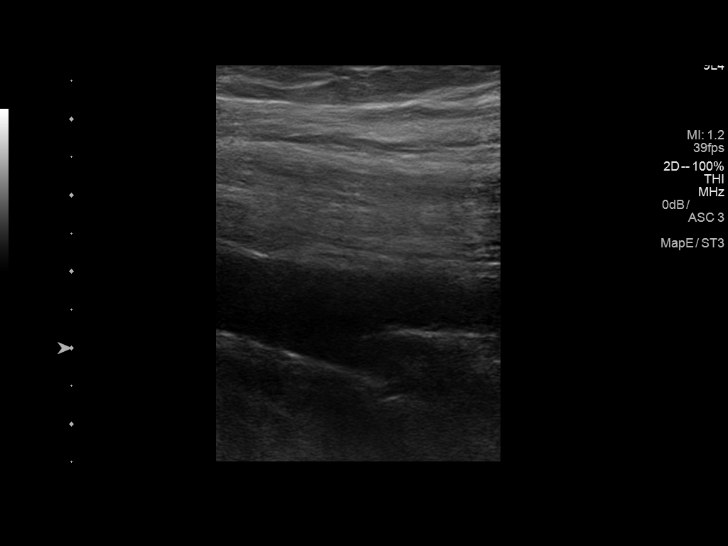
[im 15/85]
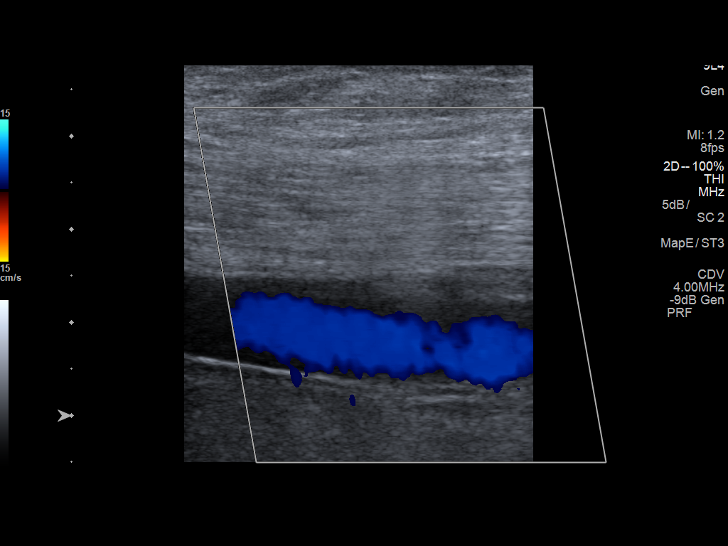
[im 22/85]
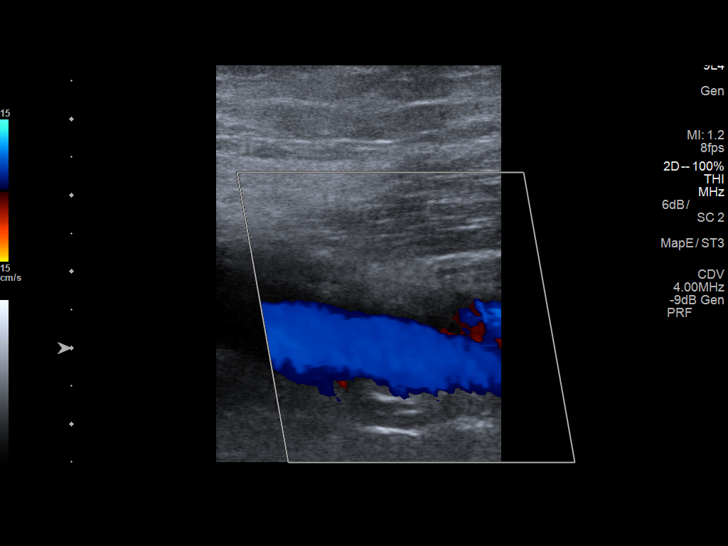
[im 30/85]
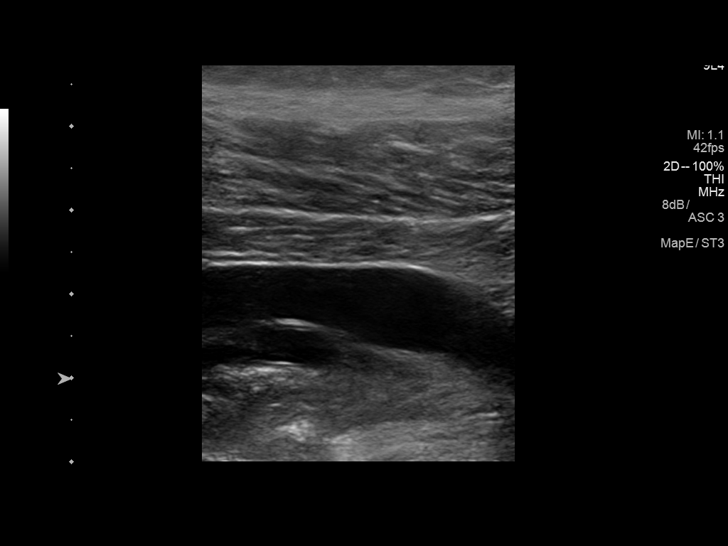
[im 37/85]
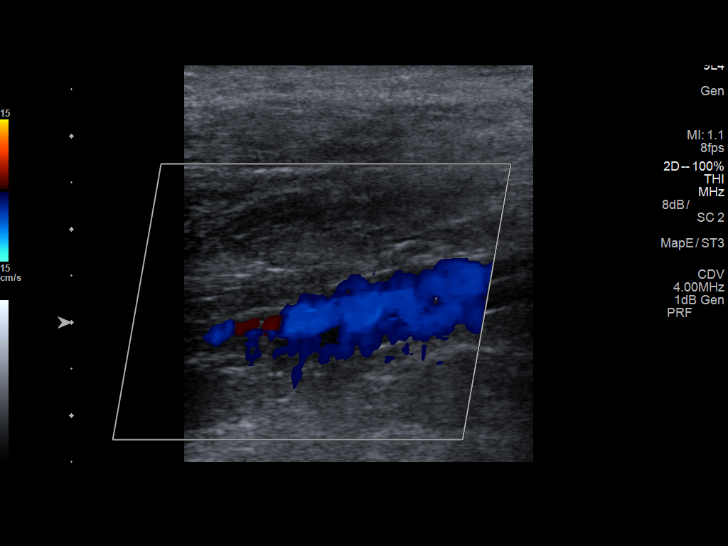
[im 44/85]
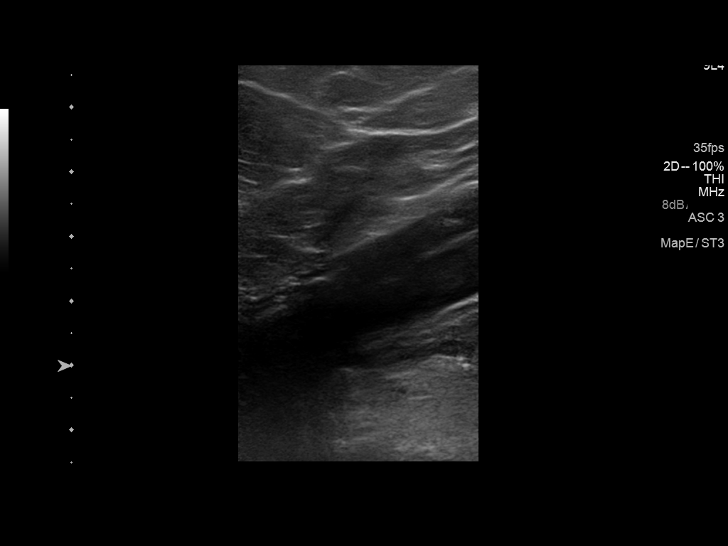
[im 48/85]
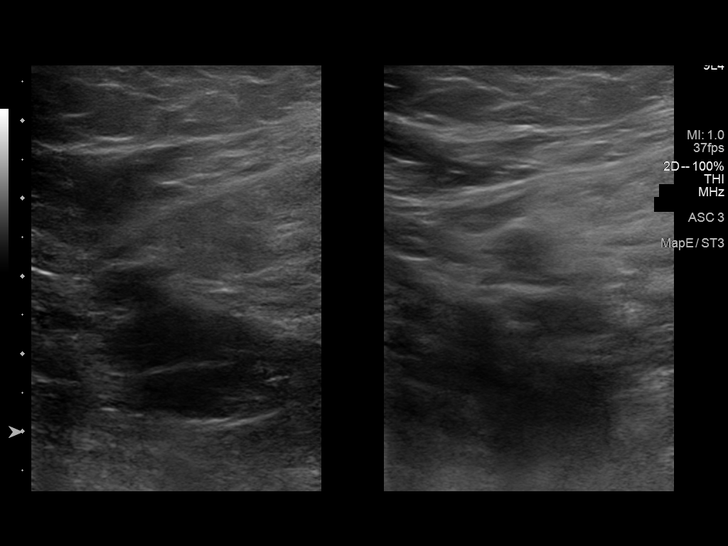
[im 55/85]
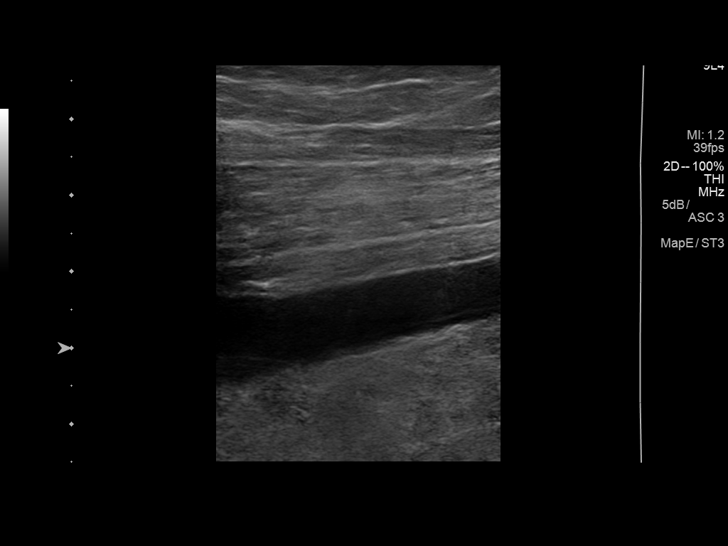
[im 63/85]
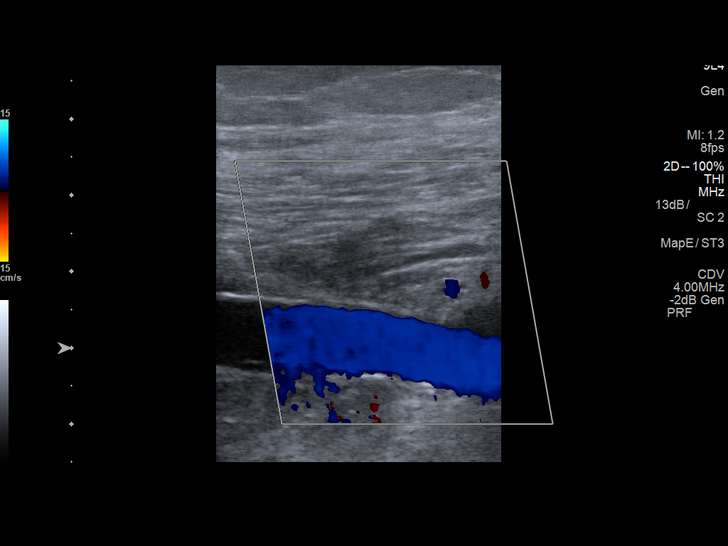
[im 70/85]
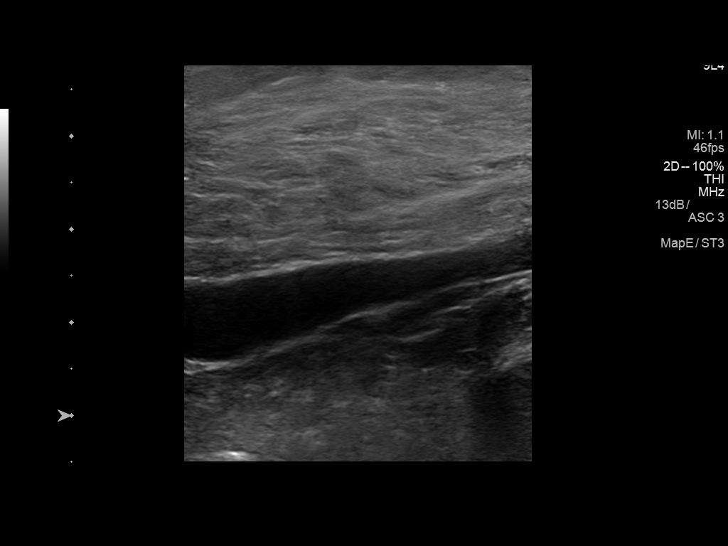
[im 77/85]
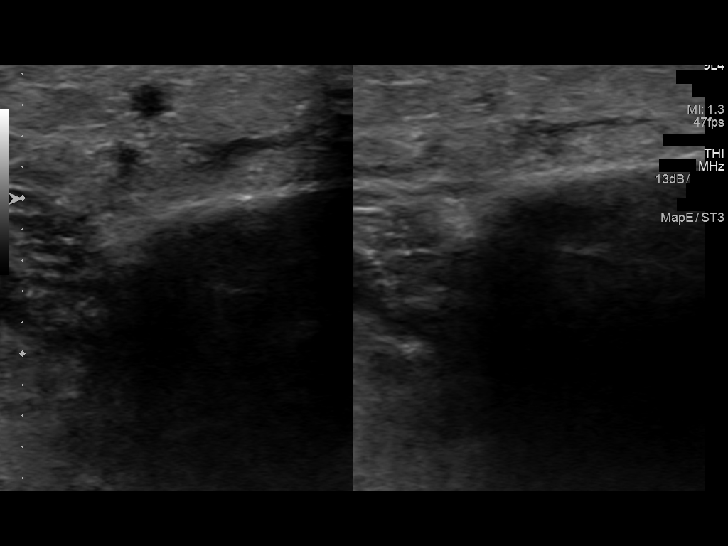
[im 85/85]
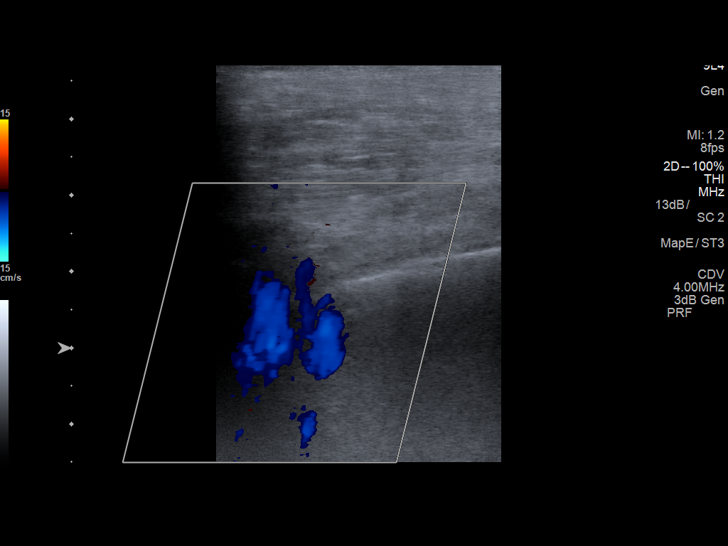

[13 of 24 positions shown; findings below may reference images not displayed]

FINDINGS: RIGHT LOWER EXTREMITY

Common Femoral Vein: No evidence of thrombus. Normal
compressibility, respiratory phasicity and response to augmentation.

Saphenofemoral Junction: No evidence of thrombus. Normal
compressibility and flow on color Doppler imaging.

Profunda Femoral Vein: No evidence of thrombus. Normal
compressibility and flow on color Doppler imaging.

Femoral Vein: No evidence of thrombus. Normal compressibility,
respiratory phasicity and response to augmentation.

Popliteal Vein: No evidence of thrombus. Normal compressibility,
respiratory phasicity and response to augmentation.

Calf Veins: No evidence of thrombus. Normal compressibility and flow
on color Doppler imaging.

Superficial Great Saphenous Vein: No evidence of thrombus. Normal
compressibility and flow on color Doppler imaging.

Venous Reflux:  None.

Other Findings:  None.

LEFT LOWER EXTREMITY

Common Femoral Vein: No evidence of thrombus. Normal
compressibility, respiratory phasicity and response to augmentation.

Saphenofemoral Junction: No evidence of thrombus. Normal
compressibility and flow on color Doppler imaging.

Profunda Femoral Vein: No evidence of thrombus. Normal
compressibility and flow on color Doppler imaging.

Femoral Vein: No evidence of thrombus. Normal compressibility,
respiratory phasicity and response to augmentation.

Popliteal Vein: No evidence of thrombus. Normal compressibility,
respiratory phasicity and response to augmentation.

Calf Veins: No evidence of thrombus. Normal compressibility and flow
on color Doppler imaging.

Superficial Great Saphenous Vein: No evidence of thrombus. Normal
compressibility and flow on color Doppler imaging.

Venous Reflux:  None.

Other Findings:  None.
IMPRESSION: No evidence of deep venous thrombosis bilateral lower extremity.

## 2017-11-21 IMAGING — US US ABDOMEN COMPLETE W/ ELASTOGRAPHY
1 series · 13 of 25 positions shown · non-contrast
Comparison: None.

CLINICAL DATA: Elevated LFTs



[Series 1: us abdomen complete w/ elastography · 0.16mm/px · 13 of 34 slices shown]
[im 1/34]
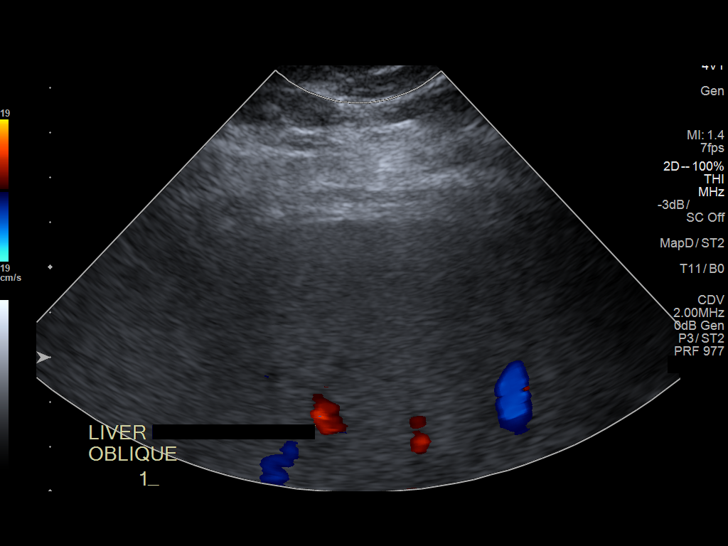
[im 3/34]
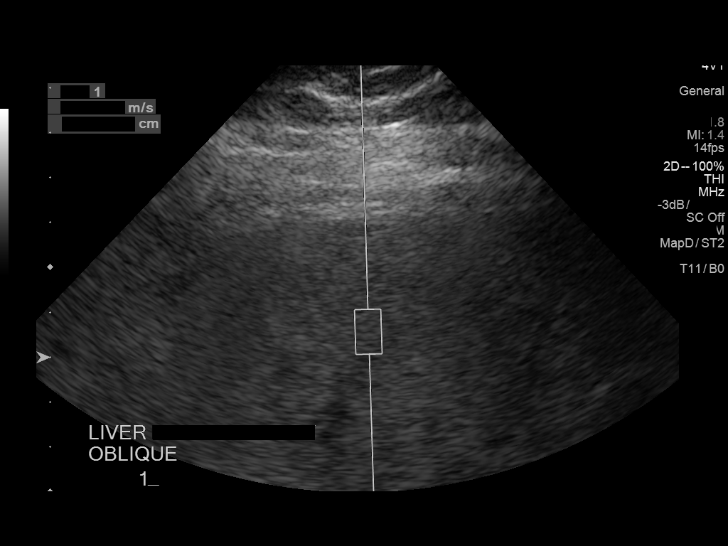
[im 6/34]
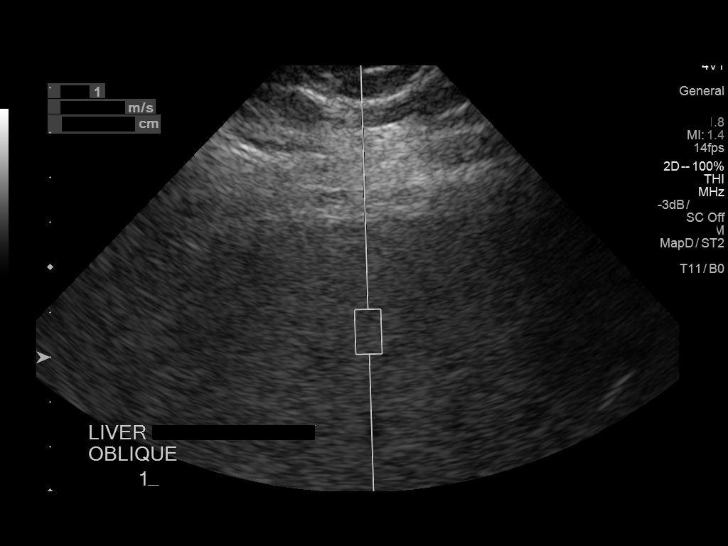
[im 9/34]
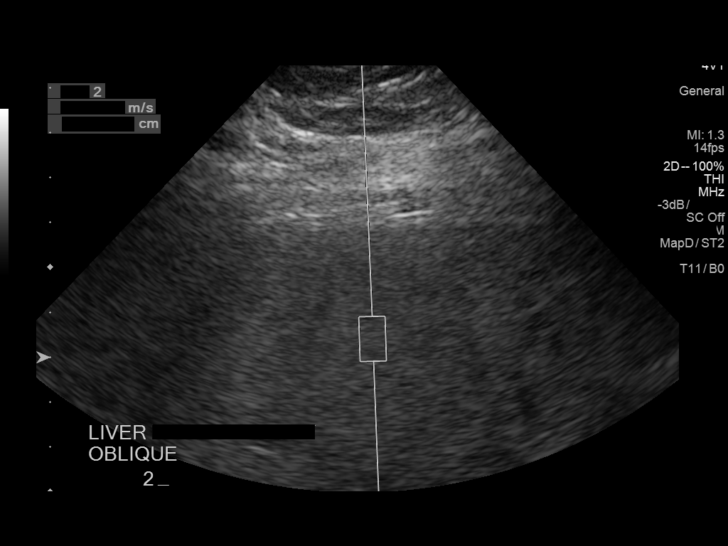
[im 12/34]
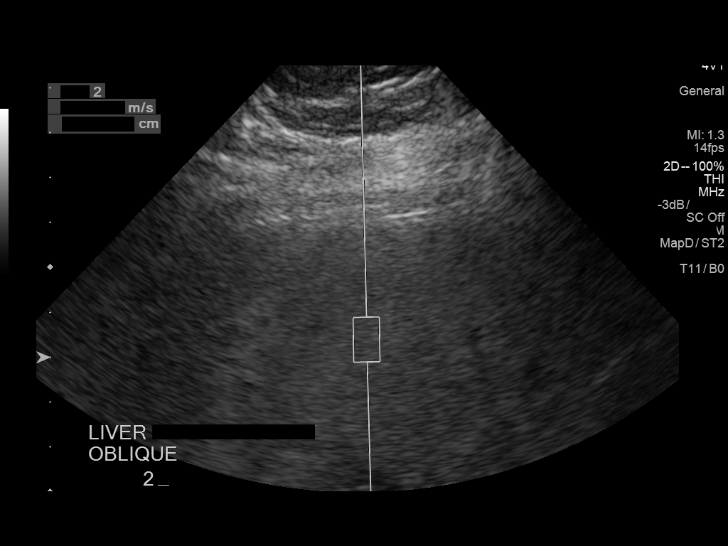
[im 14/34]
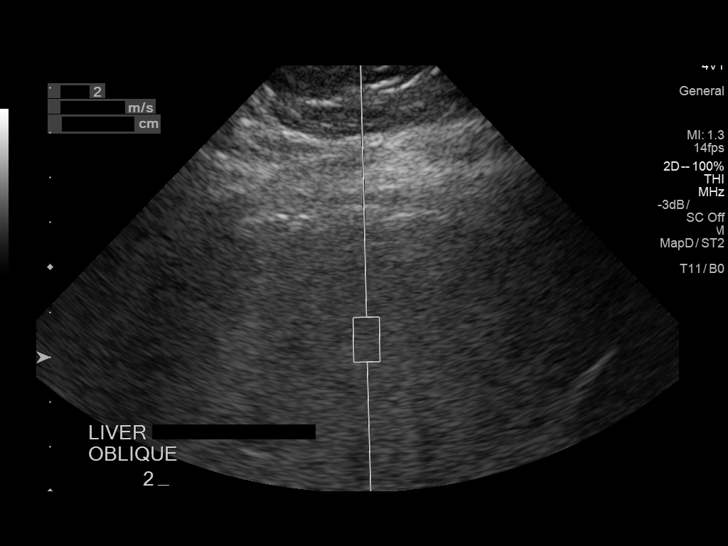
[im 17/34]
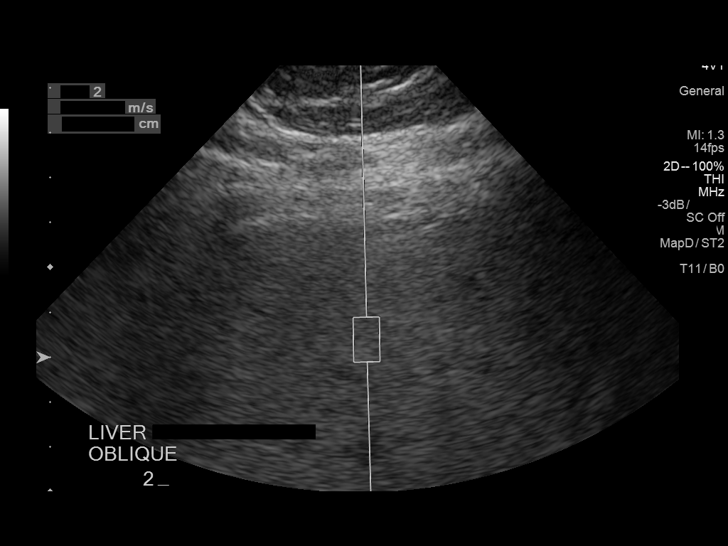
[im 20/34]
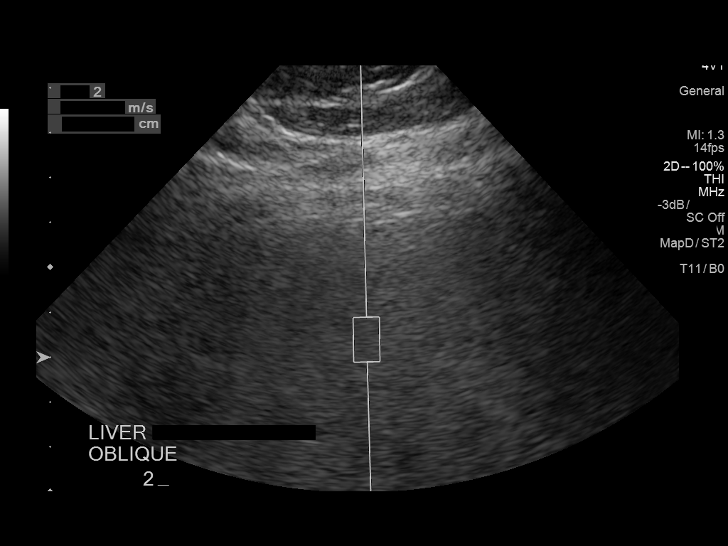
[im 23/34]
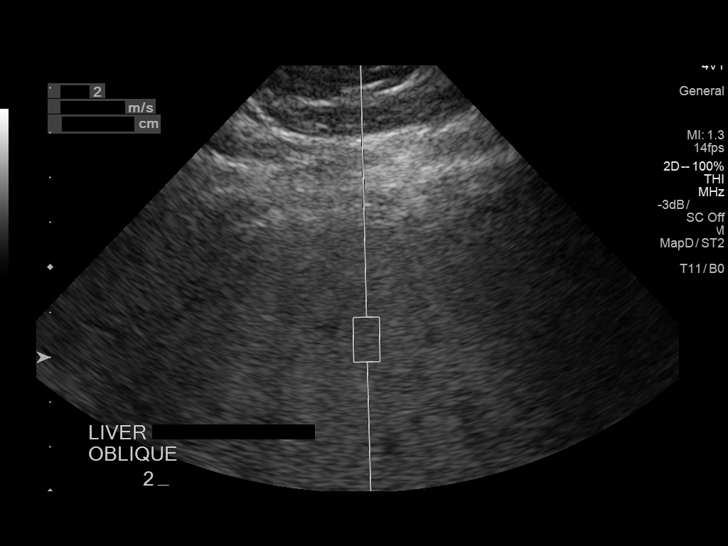
[im 25/34]
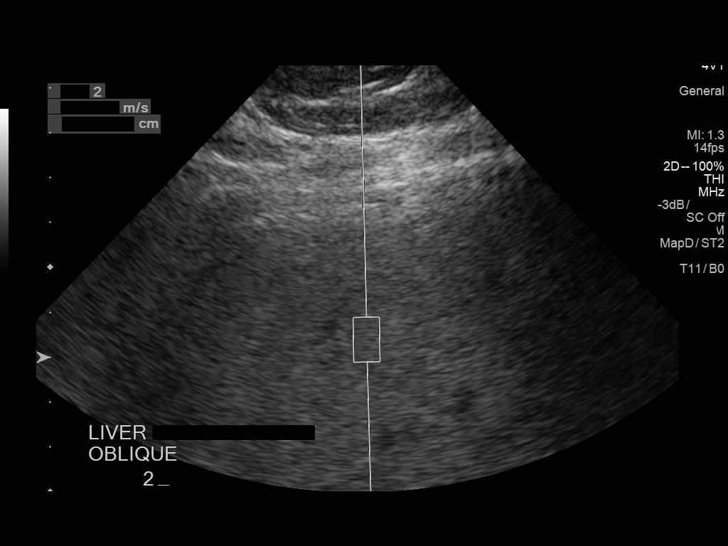
[im 28/34]
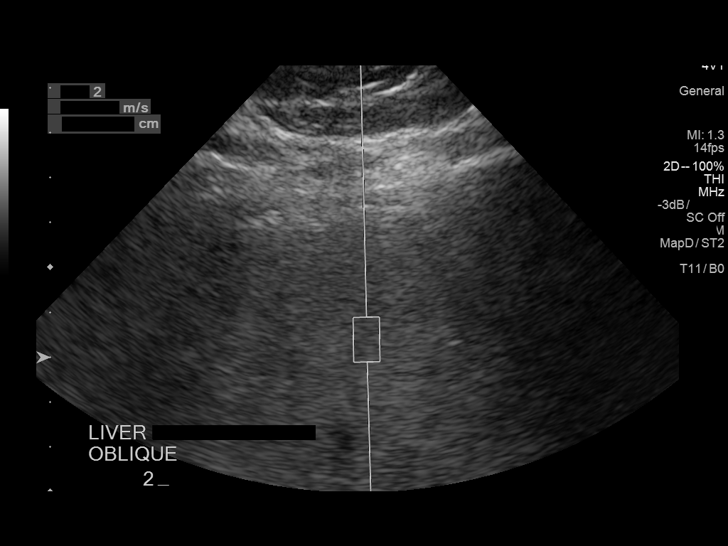
[im 31/34]
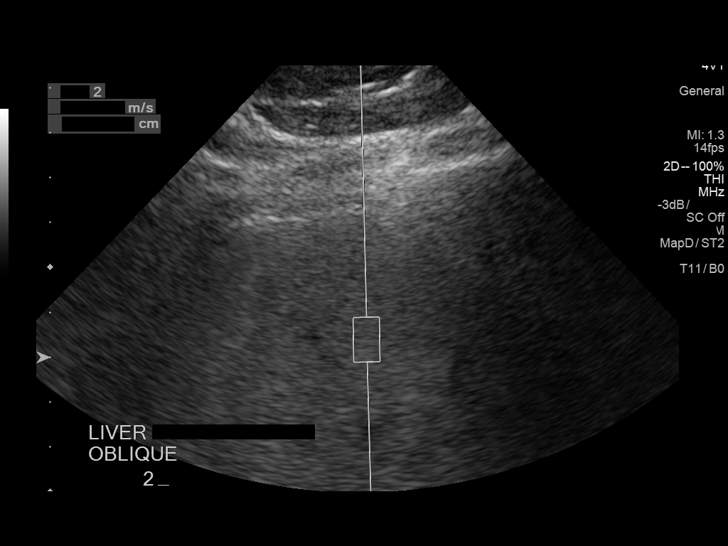
[im 34/34]
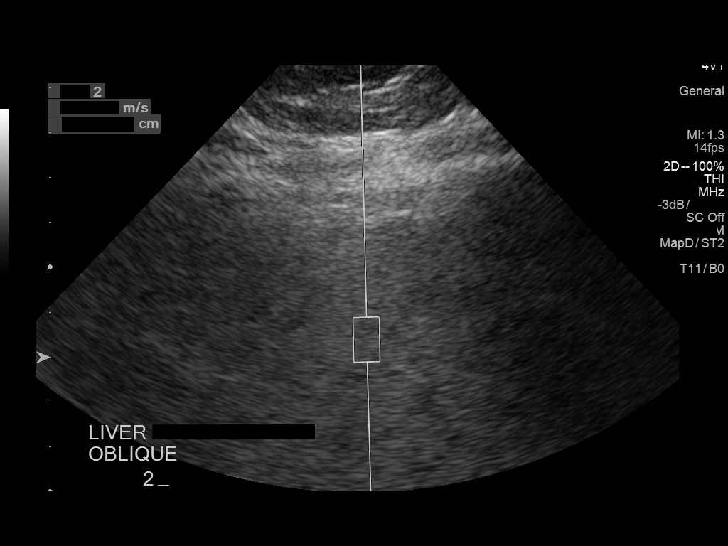

[13 of 25 positions shown; findings below may reference images not displayed]

FINDINGS: ULTRASOUND ABDOMEN

Gallbladder: No gallstones or wall thickening visualized. No
sonographic Murphy sign noted by sonographer.

Common bile duct: Diameter: 5.2 mm

Liver: Increased parenchymal echogenicity. No focal liver
abnormality.

IVC: No abnormality visualized.

Pancreas: Visualized portion unremarkable.

Spleen: Size and appearance within normal limits.

Right Kidney: Length: 12.6 cm. Echogenicity within normal limits. No
mass or hydronephrosis visualized.

Left Kidney: Length: 12.0 cm. Echogenicity within normal limits. No
mass or hydronephrosis visualized.

Abdominal aorta: Measures 2.6 cm in maximum AP dimension.

Other findings: None.

ULTRASOUND HEPATIC ELASTOGRAPHY

Device: Siemens Helix VTQ

Patient position:  Oblique

Transducer 4 V 1

Number of measurements:  10

Hepatic Segment:  8

Median velocity:   3.11  m/sec

IQR:

IQR/Median velocity ratio

Corresponding Metavir fibrosis score: Some Fahrenheit 3 and
Fahrenheit 4

Risk of fibrosis: High

Limitations of exam: Exam detail diminished secondary to patient's
body habitus

Pertinent findings noted on other imaging exams:  None

Please note that abnormal shear wave velocities may also be
identified in clinical settings other than with hepatic fibrosis,
such as: acute hepatitis, elevated right heart and central venous
pressures including use of beta blockers, Sussman disease
(Tripti), infiltrative processes such as
mastocytosis/amyloidosis/infiltrative tumor, extrahepatic
cholestasis, in the post-prandial state, and liver transplantation.
Correlation with patient history, laboratory data, and clinical
condition recommended.
IMPRESSION: 1. Ectatic abdominal aorta at risk for aneurysm development.
Recommend followup by ultrasound in 5 years. This recommendation
follows ACR consensus guidelines: White Paper of the ACR Incidental
Findings Committee II on Vascular Findings. [HOSPITAL] 7492;
[DATE].

Median hepatic shear wave velocity is calculated at 3.11 m/sec.

Corresponding Metavir fibrosis score is some F3 and F4.

Risk of fibrosis is high.

Follow-up:  Advised

## 2017-12-02 DIAGNOSIS — G4733 Obstructive sleep apnea (adult) (pediatric): Secondary | ICD-10-CM | POA: Diagnosis not present

## 2018-01-02 DIAGNOSIS — G4733 Obstructive sleep apnea (adult) (pediatric): Secondary | ICD-10-CM | POA: Diagnosis not present

## 2018-01-20 DIAGNOSIS — Z471 Aftercare following joint replacement surgery: Secondary | ICD-10-CM | POA: Diagnosis not present

## 2018-01-20 DIAGNOSIS — M160 Bilateral primary osteoarthritis of hip: Secondary | ICD-10-CM | POA: Diagnosis not present

## 2018-01-20 DIAGNOSIS — Z96643 Presence of artificial hip joint, bilateral: Secondary | ICD-10-CM | POA: Diagnosis not present

## 2018-01-23 ENCOUNTER — Ambulatory Visit (INDEPENDENT_AMBULATORY_CARE_PROVIDER_SITE_OTHER): Payer: BLUE CROSS/BLUE SHIELD | Admitting: Family Medicine

## 2018-01-23 ENCOUNTER — Encounter: Payer: Self-pay | Admitting: Family Medicine

## 2018-01-23 VITALS — BP 128/84 | Ht 71.0 in | Wt 272.2 lb

## 2018-01-23 DIAGNOSIS — E7849 Other hyperlipidemia: Secondary | ICD-10-CM | POA: Diagnosis not present

## 2018-01-23 DIAGNOSIS — M1A0790 Idiopathic chronic gout, unspecified ankle and foot, without tophus (tophi): Secondary | ICD-10-CM

## 2018-01-23 DIAGNOSIS — I10 Essential (primary) hypertension: Secondary | ICD-10-CM

## 2018-01-23 DIAGNOSIS — K760 Fatty (change of) liver, not elsewhere classified: Secondary | ICD-10-CM

## 2018-01-23 DIAGNOSIS — R7303 Prediabetes: Secondary | ICD-10-CM | POA: Diagnosis not present

## 2018-01-23 DIAGNOSIS — Z114 Encounter for screening for human immunodeficiency virus [HIV]: Secondary | ICD-10-CM | POA: Diagnosis not present

## 2018-01-23 MED ORDER — LISINOPRIL 10 MG PO TABS: 10.0000 mg | ORAL_TABLET | Freq: Every day | ORAL | 1 refills | Status: DC

## 2018-01-23 MED ORDER — ATORVASTATIN CALCIUM 20 MG PO TABS: 20.0000 mg | ORAL_TABLET | Freq: Every day | ORAL | 1 refills | Status: DC

## 2018-01-23 MED ORDER — HYDROCHLOROTHIAZIDE 25 MG PO TABS: 25.0000 mg | ORAL_TABLET | Freq: Every day | ORAL | 1 refills | Status: DC

## 2018-01-23 MED ORDER — ALLOPURINOL 300 MG PO TABS: 300.0000 mg | ORAL_TABLET | Freq: Every day | ORAL | 1 refills | Status: DC

## 2018-01-23 MED ORDER — SILDENAFIL CITRATE 20 MG PO TABS: ORAL_TABLET | ORAL | 3 refills | Status: DC

## 2018-01-23 MED ORDER — POTASSIUM CHLORIDE CRYS ER 20 MEQ PO TBCR: 20.0000 meq | EXTENDED_RELEASE_TABLET | Freq: Every morning | ORAL | 1 refills | Status: DC

## 2018-01-23 MED ORDER — TRAZODONE HCL 50 MG PO TABS: 25.0000 mg | ORAL_TABLET | Freq: Every evening | ORAL | 3 refills | Status: DC | PRN

## 2018-01-23 NOTE — Progress Notes (Signed)
Subjective:    Patient ID: Manuel Torres, male    DOB: 09-24-1961, 57 y.o.   MRN: [id number]  Hypertension  This is a chronic problem. Pertinent negatives include no chest pain, headaches or shortness of breath. Risk factors for coronary artery disease include male gender, obesity and dyslipidemia. Treatments tried: lisinopril.   The patient was seen today as part of a comprehensive diabetic check up.The patient relates medication compliance. No significant side effects to the medications. Denies any low glucose spells. Relates compliance with diet to a reasonable level. Patient does do labwork intermittently and understands the dangers of diabetes.  The patient has essentially prediabetes very important for him to lose weight exercise keep A1c under good control  Patient here for follow-up regarding cholesterol.  Patient does try to maintain a reasonable diet.  Patient does take the medication on a regular basis.  Denies missing a dose.  The patient denies any obvious side effects.  Prior blood work results reviewed with the patient.  The patient is aware of his cholesterol goals and the need to keep it under good control to lessen the risk of disease. Healthy diet taking his medication all discussed in detail  The patient's BMI is calculated.  It is in the vital signs and acknowledged.  It is above the recommended BMI for the patient's height and weight.  The patient has been counseled regarding healthy diet, restricted portions, avoiding excessive carbohydrates/sugary foods, and increase physical activity as health permits.  It is in the patient's best interest to lower the risk of secondary illness including heart disease strokes and cancer by losing weight.  The patient acknowledges this information. Morbid obesity importance of losing weight to lessen the risk of strokes heart attack  Gout under good control medication and diet no flareups recently  Fatty liver showed progressive nature  saw specialist in the fall needs follow-up on his lipid profile and liver profile importance of healthy eating regular physical activity and weight loss seem to be understood by the patient the risk of cirrhosis present    Review of Systems  Constitutional: Negative for activity change, appetite change and fatigue.  HENT: Negative for congestion and rhinorrhea.   Respiratory: Negative for cough, chest tightness and shortness of breath.   Cardiovascular: Negative for chest pain and leg swelling.  Gastrointestinal: Negative for abdominal pain, diarrhea and nausea.  Endocrine: Negative for polydipsia and polyphagia.  Genitourinary: Negative for dysuria and hematuria.  Neurological: Negative for weakness and headaches.  Psychiatric/Behavioral: Negative for confusion and dysphoric mood.       Objective:   Physical Exam  Constitutional: He appears well-nourished.  Cardiovascular: Normal rate, regular rhythm and normal heart sounds.  No murmur heard. Pulmonary/Chest: Effort normal and breath sounds normal.  Musculoskeletal: He exhibits no edema.  Lymphadenopathy:    He has no cervical adenopathy.  Neurological: He is alert.  Psychiatric: His behavior is normal.  Vitals reviewed.         Assessment & Plan:  1. Essential hypertension, benign Blood pressure decent control continue medication watch diet try to lose weight - Hepatic function panel - Basic metabolic panel  2. Morbid obesity ([place]) The patient's BMI is calculated.  It is in the vital signs and acknowledged.  It is above the recommended BMI for the patient's height and weight.  The patient has been counseled regarding healthy diet, restricted portions, avoiding excessive carbohydrates/sugary foods, and increase physical activity as health permits.  It is in the patient's  best interest to lower the risk of secondary illness including heart disease strokes and cancer by losing weight.  The patient acknowledges this  information. Information given regarding DASH diet  3. Prediabetes Cut back on starches watch diet cut back on portions exercise try to lose weight - Hemoglobin A1c  4. Other hyperlipidemia Continue current medication watch diet check lab work await results - Lipid panel  5. Idiopathic chronic gout of foot without tophus, unspecified laterality No flareups recently continue medication watch diet check lab work - Uric acid  6. Screening for HIV (human immunodeficiency virus) Screening - HIV antibody  7. Fatty liver Recheck liver profile.  Very important to monitor patient closely.  Patient has been seen by gastroenterology.  Patient was warned that this could increase her risk of cirrhosis  25 minutes was spent with the patient.  This statement verifies that 25 minutes was indeed spent with the patient. Greater than half the time was spent in discussion, counseling and answering questions  regarding the issues that the patient came in for today as reflected in the diagnosis (s) please refer to documentation for further details.

## 2018-01-24 LAB — HEPATIC FUNCTION PANEL
ALBUMIN: 4 g/dL (ref 3.5–5.5)
ALK PHOS: 82 IU/L (ref 39–117)
ALT: 31 IU/L (ref 0–44)
AST: 39 IU/L (ref 0–40)
BILIRUBIN TOTAL: 0.4 mg/dL (ref 0.0–1.2)
Bilirubin, Direct: 0.15 mg/dL (ref 0.00–0.40)
TOTAL PROTEIN: 6.9 g/dL (ref 6.0–8.5)

## 2018-01-24 LAB — HEMOGLOBIN A1C
Est. average glucose Bld gHb Est-mCnc: 131 mg/dL
HEMOGLOBIN A1C: 6.2 % — AB (ref 4.8–5.6)

## 2018-01-24 LAB — LIPID PANEL
Chol/HDL Ratio: 5.1 ratio — ABNORMAL HIGH (ref 0.0–5.0)
Cholesterol, Total: 184 mg/dL (ref 100–199)
HDL: 36 mg/dL — ABNORMAL LOW (ref >39)
LDL Calculated: 122 mg/dL — ABNORMAL HIGH (ref 0–99)
Triglycerides: 129 mg/dL (ref 0–149)
VLDL CHOLESTEROL CAL: 26 mg/dL (ref 5–40)

## 2018-01-24 LAB — BASIC METABOLIC PANEL
BUN/Creatinine Ratio: 11 (ref 9–20)
BUN: 10 mg/dL (ref 6–24)
CO2: 22 mmol/L (ref 20–29)
CREATININE: 0.88 mg/dL (ref 0.76–1.27)
Calcium: 9.6 mg/dL (ref 8.7–10.2)
Chloride: 103 mmol/L (ref 96–106)
GFR calc Af Amer: 110 mL/min/{1.73_m2} (ref >59)
GFR, EST NON AFRICAN AMERICAN: 95 mL/min/{1.73_m2} (ref >59)
Glucose: 118 mg/dL — ABNORMAL HIGH (ref 65–99)
Potassium: 4.7 mmol/L (ref 3.5–5.2)
SODIUM: 141 mmol/L (ref 134–144)

## 2018-01-24 LAB — URIC ACID: Uric Acid: 8 mg/dL (ref 3.7–8.6)

## 2018-01-24 LAB — HIV ANTIBODY (ROUTINE TESTING W REFLEX): HIV Screen 4th Generation wRfx: NONREACTIVE

## 2018-01-25 ENCOUNTER — Other Ambulatory Visit: Payer: Self-pay | Admitting: Family Medicine

## 2018-01-25 DIAGNOSIS — R7303 Prediabetes: Secondary | ICD-10-CM

## 2018-01-25 DIAGNOSIS — E7849 Other hyperlipidemia: Secondary | ICD-10-CM

## 2018-01-25 DIAGNOSIS — I10 Essential (primary) hypertension: Secondary | ICD-10-CM

## 2018-01-25 MED ORDER — ATORVASTATIN CALCIUM 40 MG PO TABS: 40.0000 mg | ORAL_TABLET | Freq: Every day | ORAL | 5 refills | Status: DC

## 2018-01-25 MED ORDER — METFORMIN HCL 500 MG PO TABS: ORAL_TABLET | ORAL | 5 refills | Status: DC

## 2018-01-30 ENCOUNTER — Other Ambulatory Visit: Payer: Self-pay | Admitting: *Deleted

## 2018-01-30 DIAGNOSIS — G4733 Obstructive sleep apnea (adult) (pediatric): Secondary | ICD-10-CM | POA: Diagnosis not present

## 2018-01-30 DIAGNOSIS — E119 Type 2 diabetes mellitus without complications: Secondary | ICD-10-CM

## 2018-01-30 DIAGNOSIS — E785 Hyperlipidemia, unspecified: Secondary | ICD-10-CM

## 2018-01-30 DIAGNOSIS — Z79899 Other long term (current) drug therapy: Secondary | ICD-10-CM

## 2018-01-30 MED ORDER — ATORVASTATIN CALCIUM 40 MG PO TABS: 40.0000 mg | ORAL_TABLET | Freq: Every day | ORAL | 1 refills | Status: DC

## 2018-01-30 MED ORDER — METFORMIN HCL 500 MG PO TABS: ORAL_TABLET | ORAL | 1 refills | Status: DC

## 2018-02-03 DIAGNOSIS — I10 Essential (primary) hypertension: Secondary | ICD-10-CM | POA: Diagnosis not present

## 2018-02-03 DIAGNOSIS — M47816 Spondylosis without myelopathy or radiculopathy, lumbar region: Secondary | ICD-10-CM | POA: Diagnosis not present

## 2018-02-03 DIAGNOSIS — M5416 Radiculopathy, lumbar region: Secondary | ICD-10-CM | POA: Diagnosis not present

## 2018-02-03 DIAGNOSIS — M961 Postlaminectomy syndrome, not elsewhere classified: Secondary | ICD-10-CM | POA: Diagnosis not present

## 2018-02-07 ENCOUNTER — Encounter (INDEPENDENT_AMBULATORY_CARE_PROVIDER_SITE_OTHER): Payer: Self-pay | Admitting: Internal Medicine

## 2018-02-07 ENCOUNTER — Ambulatory Visit (INDEPENDENT_AMBULATORY_CARE_PROVIDER_SITE_OTHER): Payer: BLUE CROSS/BLUE SHIELD | Admitting: Internal Medicine

## 2018-02-07 VITALS — BP 160/100 | HR 60 | Temp 98.2°F | Ht 70.0 in | Wt 263.2 lb

## 2018-02-07 DIAGNOSIS — R748 Abnormal levels of other serum enzymes: Secondary | ICD-10-CM | POA: Diagnosis not present

## 2018-02-07 DIAGNOSIS — K760 Fatty (change of) liver, not elsewhere classified: Secondary | ICD-10-CM | POA: Diagnosis not present

## 2018-02-07 NOTE — Patient Instructions (Signed)
OV in 1  Year. 

## 2018-02-07 NOTE — Progress Notes (Signed)
Subjective:    Patient ID: Manuel Torres, male    DOB: 12-06-1960, 57 y.o.   MRN: [id number]  HPIHere today for f/u. Last seen 08/10/2017 for elevated liver enzymes. Hx of same at least since 2011. Liver enzymes in September of 2018 and march of this year were normal. [place] Elastography on 05/20/2016 revealed Metavir fibrosis score of F3 and F4.He says he is drinking a beer daily. Nothing excessively. Appetite is good. He has been dieting. He has lost 11 pounds since his last visit in September. BMs are normal. He rides a stationary bike.    Hepatic Function Latest Ref Rng & Units 01/23/2018 08/11/2017 07/27/2017  Total Protein 6.0 - 8.5 g/dL 6.9 6.6 6.8  Albumin 3.5 - 5.5 g/dL 4.0 4.0 3.9  AST 0 - 40 IU/L 39 38 76(H)  ALT 0 - 44 IU/L 31 31 65(H)  Alk Phosphatase 39 - 117 IU/L 82 86 81  Total Bilirubin 0.0 - 1.2 mg/dL 0.4 0.4 0.3  Bilirubin, Direct 0.00 - 0.40 mg/dL 0.15 0.11 0.09     Review of Systems Past Medical History:  Diagnosis Date  . Arthritis    Hip  . Fatty liver 08/10/2017  . Gout    NO RECENT FLARE UPS  . Gout   . Hypercholesteremia   . Hypertension     Past Surgical History:  Procedure Laterality Date  . BACK SURGERY     April 22, 2016, laminectomy   . COLONOSCOPY  08/03/2012   Procedure: COLONOSCOPY;  Surgeon: Rogene Houston, MD;  Location: [facility];  Service: Endoscopy;  Laterality: N/A;  830  . JOINT REPLACEMENT     r hip Dr. Maureen Ralphs Dec 2013  . right inguinal hernia    . TOTAL HIP ARTHROPLASTY  10/23/2012   Procedure: TOTAL HIP ARTHROPLASTY;  Surgeon: Gearlean Alf, MD;  Location: [facility];  Service: Orthopedics;  Laterality: Right;  . TOTAL HIP ARTHROPLASTY Left 02/02/2017   Procedure: LEFT TOTAL HIP ARTHROPLASTY ANTERIOR APPROACH;  Surgeon: Gaynelle Arabian, MD;  Location: [facility];  Service: Orthopedics;  Laterality: Left;    Allergies  Allergen Reactions  . Robaxin [Methocarbamol]     hiccups    Current Outpatient Medications on File Prior to Visit    Medication Sig Dispense Refill  . allopurinol (ZYLOPRIM) 300 MG tablet Take 1 tablet (300 mg total) by mouth daily. 90 tablet 1  . aspirin EC 81 MG tablet Take 81 mg by mouth daily.    Marland Kitchen atorvastatin (LIPITOR) 40 MG tablet Take 1 tablet (40 mg total) by mouth daily. 90 tablet 1  . gabapentin (NEURONTIN) 300 MG capsule Take 1 capsule (300 mg total) by mouth 3 (three) times daily as needed (for pain. (with the percocet)). (Patient taking differently: Take 300 mg by mouth 2 (two) times daily. ) 90 capsule 0  . hydrochlorothiazide (HYDRODIURIL) 25 MG tablet Take 1 tablet (25 mg total) by mouth daily. 90 tablet 1  . lisinopril (PRINIVIL,ZESTRIL) 10 MG tablet Take 1 tablet (10 mg total) by mouth daily. 90 tablet 1  . metFORMIN (GLUCOPHAGE) 500 MG tablet Take 1/2 tab twice daily 90 tablet 1  . potassium chloride SA (KLOR-CON M20) 20 MEQ tablet Take 1 tablet (20 mEq total) by mouth every morning. 90 tablet 1  . sildenafil (REVATIO) 20 MG tablet 3 to 5 before relations 50 tablet 3  . traZODone (DESYREL) 50 MG tablet Take 0.5-1 tablets (25-50 mg total) by mouth at bedtime as needed for  sleep. 90 tablet 3  . oxyCODONE (OXY IR/ROXICODONE) 5 MG immediate release tablet Take 1-3 tablets (5-15 mg total) by mouth every 4 (four) hours as needed for moderate pain or severe pain. 120 tablet 0   No current facility-administered medications on file prior to visit.         Objective:   Physical Exam  Blood pressure (!) 160/100, pulse 60, temperature 98.2 F (36.8 C), height '5\' 10"'  (1.778 m), weight 263 lb 3.2 oz (119.4 kg).  Alert and oriented. Skin warm and dry. Oral mucosa is moist.   . Sclera anicteric, conjunctivae is pink. Thyroid not enlarged. No cervical lymphadenopathy. Lungs clear. Heart regular rate and rhythm.  Abdomen is soft. Bowel sounds are positive. No hepatomegaly. No abdominal masses felt. No tenderness.  No edema to lower extremities.        Assessment & Plan:  Fatty liver ,elevated  enzymes. Will get [place] RUQ in 3 months. Hepatic function in 3 months. OV in 1 year.

## 2018-03-02 DIAGNOSIS — G4733 Obstructive sleep apnea (adult) (pediatric): Secondary | ICD-10-CM | POA: Diagnosis not present

## 2018-04-25 ENCOUNTER — Encounter (INDEPENDENT_AMBULATORY_CARE_PROVIDER_SITE_OTHER): Payer: Self-pay | Admitting: *Deleted

## 2018-04-26 DIAGNOSIS — M47816 Spondylosis without myelopathy or radiculopathy, lumbar region: Secondary | ICD-10-CM | POA: Diagnosis not present

## 2018-04-26 DIAGNOSIS — M5416 Radiculopathy, lumbar region: Secondary | ICD-10-CM | POA: Diagnosis not present

## 2018-04-26 DIAGNOSIS — M961 Postlaminectomy syndrome, not elsewhere classified: Secondary | ICD-10-CM | POA: Diagnosis not present

## 2018-05-25 DIAGNOSIS — M961 Postlaminectomy syndrome, not elsewhere classified: Secondary | ICD-10-CM | POA: Diagnosis not present

## 2018-05-25 DIAGNOSIS — M5416 Radiculopathy, lumbar region: Secondary | ICD-10-CM | POA: Diagnosis not present

## 2018-07-21 ENCOUNTER — Other Ambulatory Visit: Payer: Self-pay | Admitting: Family Medicine

## 2018-07-26 ENCOUNTER — Ambulatory Visit (INDEPENDENT_AMBULATORY_CARE_PROVIDER_SITE_OTHER): Payer: BLUE CROSS/BLUE SHIELD | Admitting: Family Medicine

## 2018-07-26 ENCOUNTER — Encounter: Payer: Self-pay | Admitting: Family Medicine

## 2018-07-26 VITALS — BP 128/84 | Ht 70.0 in | Wt 254.0 lb

## 2018-07-26 DIAGNOSIS — E7849 Other hyperlipidemia: Secondary | ICD-10-CM | POA: Diagnosis not present

## 2018-07-26 DIAGNOSIS — Z125 Encounter for screening for malignant neoplasm of prostate: Secondary | ICD-10-CM | POA: Diagnosis not present

## 2018-07-26 DIAGNOSIS — Z0000 Encounter for general adult medical examination without abnormal findings: Secondary | ICD-10-CM

## 2018-07-26 DIAGNOSIS — I10 Essential (primary) hypertension: Secondary | ICD-10-CM

## 2018-07-26 DIAGNOSIS — R7303 Prediabetes: Secondary | ICD-10-CM

## 2018-07-26 DIAGNOSIS — M1A0790 Idiopathic chronic gout, unspecified ankle and foot, without tophus (tophi): Secondary | ICD-10-CM | POA: Diagnosis not present

## 2018-07-26 DIAGNOSIS — F320 Major depressive disorder, single episode, mild: Secondary | ICD-10-CM | POA: Insufficient documentation

## 2018-07-26 MED ORDER — HYDROCHLOROTHIAZIDE 25 MG PO TABS: 25.0000 mg | ORAL_TABLET | Freq: Every day | ORAL | 1 refills | Status: DC

## 2018-07-26 MED ORDER — LISINOPRIL 10 MG PO TABS: 10.0000 mg | ORAL_TABLET | Freq: Every day | ORAL | 1 refills | Status: DC

## 2018-07-26 MED ORDER — ATORVASTATIN CALCIUM 40 MG PO TABS: 40.0000 mg | ORAL_TABLET | Freq: Every day | ORAL | 1 refills | Status: DC

## 2018-07-26 MED ORDER — SERTRALINE HCL 50 MG PO TABS: 50.0000 mg | ORAL_TABLET | Freq: Every day | ORAL | 3 refills | Status: DC

## 2018-07-26 NOTE — Progress Notes (Signed)
Subjective:    Patient ID: Manuel Torres, male    DOB: [birth year], 57 y.o.   MRN: [id number]  HPI The patient comes in today for a wellness visit.  Patient is also here for follow-up on blood pressure Takes his medicine on a regular basis Denies any headaches chest pain shortness of breath he does exercise on a regular basis he has actually lost quite a bit of weight Patient here for follow-up regarding cholesterol.  The patient does have hyperlipidemia.  Patient does try to maintain a reasonable diet.  Patient does take the medication on a regular basis.  Denies missing a dose.  The patient denies any obvious side effects.  Prior blood work results reviewed with the patient.  The patient is aware of his cholesterol goals and the need to keep it under good control to lessen the risk of disease.  Patient for blood pressure check up.  The patient does have hypertension.  The patient is on medication.  Patient relates compliance with meds. Todays BP reviewed with the patient. Patient denies issues with medication. Patient relates reasonable diet. Patient tries to minimize salt. Patient aware of BP goals.  Patient also has some moderate fatigue tiredness as well as feeling depressed at times he lost his son a couple years ago is been very difficult for him and his wife he is done the best he can he finds himself feeling sad and blue but denies being suicidal.  We talked at length about options patient is open to trying a antidepressant to see if that will help A review of their health history was completed.  A review of medications was also completed.  Any needed refills; trazodone  Eating habits: health conscious  Falls/  MVA accidents in past few months: none  Regular exercise: [employer] 5 -6 days a week  Specialist pt sees on regular basis: dr Luan Pulling pain management in [place]  Preventative health issues were discussed.   Additional concerns: none    Review of Systems       Objective:   Physical Exam        Assessment & Plan:  Adult wellness-complete.wellness physical was conducted today. Importance of diet and exercise were discussed in detail.  In addition to this a discussion regarding safety was also covered. We also reviewed over immunizations and gave recommendations regarding current immunization needed for age.  In addition to this additional areas were also touched on including: Preventative health exams needed:  Colonoscopy next 11/2021  Patient was advised yearly wellness exam   Major depression mild should gradually get better with treatment.  Follow-up again in several months time.  Follow-up sooner if any problems.  Warnings discussed.  Patient not suicidal.  Zoloft recommended.  Side effects discussed.  HTN- Patient was seen today as part of a visit regarding hypertension. The importance of healthy diet and regular physical activity was discussed. The importance of compliance with medications discussed.  Ideal goal is to keep blood pressure low elevated levels certainly below 409/81 when possible.  The patient was counseled that keeping blood pressure under control lessen his risk of complications.  The importance of regular follow-ups was discussed with the patient.  Low-salt diet such as DASH recommended.  Regular physical activity was recommended as well.  Patient was advised to keep regular follow-ups.  The patient was seen today as part of an evaluation regarding hyperlipidemia.  Recent lab work has been reviewed with the patient as well as the goals for  good cholesterol care.  In addition to this medications have been discussed the importance of compliance with diet and medications discussed as well.  Finally the patient is aware that poor control of cholesterol, noncompliance can dramatically increase the risk of complications. The patient will keep regular office visits and the patient does agreed to periodic lab work.  Has  history of gout check lab work await results  Prostate normal check PSA  Morbid obesity patient has lost over 20 pounds exercising watching diet

## 2018-08-04 ENCOUNTER — Other Ambulatory Visit: Payer: Self-pay | Admitting: Family Medicine

## 2018-08-24 ENCOUNTER — Other Ambulatory Visit: Payer: Self-pay | Admitting: Family Medicine

## 2018-10-22 IMAGING — DX DG PORTABLE PELVIS
1 series · 1 of 1 positions shown · non-contrast
Comparison: 10/23/2012

CLINICAL DATA: Left hip replacement

EXAM:
DG C-ARM 61-120 MIN-NO REPORT; PORTABLE PELVIS 1-2 VIEWS

[pelvis ap]
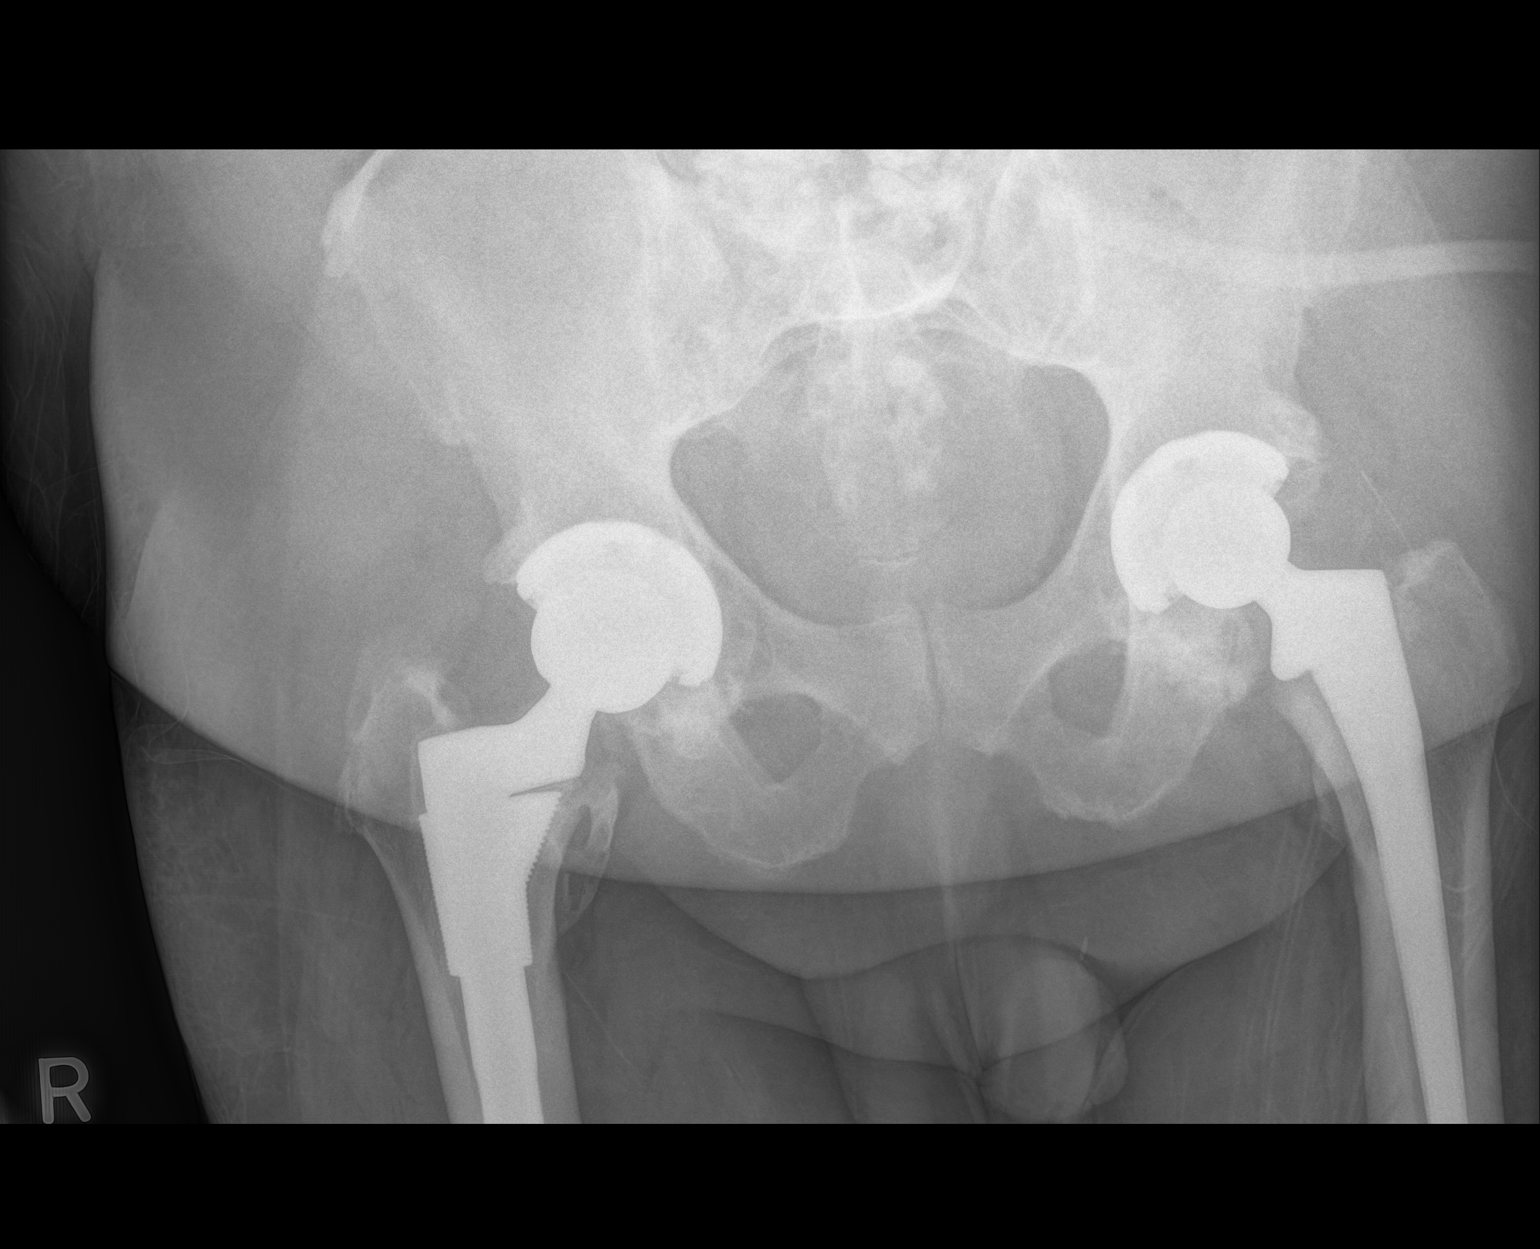

[1 of 1 positions shown; findings below may reference images not displayed]

FINDINGS: Interval left hip replacement in satisfactory position and
alignment. No fracture

Pre-existing right hip replacement appears unchanged from the prior
study.
IMPRESSION: Satisfactory left hip replacement

## 2018-10-25 ENCOUNTER — Ambulatory Visit: Payer: BLUE CROSS/BLUE SHIELD | Admitting: Family Medicine

## 2018-11-13 ENCOUNTER — Ambulatory Visit (INDEPENDENT_AMBULATORY_CARE_PROVIDER_SITE_OTHER): Payer: BLUE CROSS/BLUE SHIELD | Admitting: Family Medicine

## 2018-11-13 ENCOUNTER — Other Ambulatory Visit: Payer: Self-pay | Admitting: *Deleted

## 2018-11-13 ENCOUNTER — Encounter: Payer: Self-pay | Admitting: Family Medicine

## 2018-11-13 VITALS — BP 134/88 | Ht 70.0 in | Wt 263.4 lb

## 2018-11-13 DIAGNOSIS — E7849 Other hyperlipidemia: Secondary | ICD-10-CM | POA: Diagnosis not present

## 2018-11-13 DIAGNOSIS — R7303 Prediabetes: Secondary | ICD-10-CM | POA: Diagnosis not present

## 2018-11-13 DIAGNOSIS — Z125 Encounter for screening for malignant neoplasm of prostate: Secondary | ICD-10-CM

## 2018-11-13 DIAGNOSIS — E119 Type 2 diabetes mellitus without complications: Secondary | ICD-10-CM | POA: Diagnosis not present

## 2018-11-13 DIAGNOSIS — E785 Hyperlipidemia, unspecified: Secondary | ICD-10-CM

## 2018-11-13 DIAGNOSIS — I10 Essential (primary) hypertension: Secondary | ICD-10-CM | POA: Diagnosis not present

## 2018-11-13 DIAGNOSIS — J069 Acute upper respiratory infection, unspecified: Secondary | ICD-10-CM

## 2018-11-13 DIAGNOSIS — Z79899 Other long term (current) drug therapy: Secondary | ICD-10-CM

## 2018-11-13 LAB — POCT GLYCOSYLATED HEMOGLOBIN (HGB A1C): Hemoglobin A1C: 5.7 % — AB (ref 4.0–5.6)

## 2018-11-13 MED ORDER — HYDROCHLOROTHIAZIDE 25 MG PO TABS: 25.0000 mg | ORAL_TABLET | Freq: Every day | ORAL | 1 refills | Status: DC

## 2018-11-13 MED ORDER — ALLOPURINOL 300 MG PO TABS: 300.0000 mg | ORAL_TABLET | Freq: Every day | ORAL | 1 refills | Status: DC

## 2018-11-13 MED ORDER — ATORVASTATIN CALCIUM 40 MG PO TABS: 40.0000 mg | ORAL_TABLET | Freq: Every day | ORAL | 1 refills | Status: DC

## 2018-11-13 MED ORDER — LISINOPRIL 10 MG PO TABS: 10.0000 mg | ORAL_TABLET | Freq: Every day | ORAL | 1 refills | Status: DC

## 2018-11-13 NOTE — Progress Notes (Signed)
Subjective:    Patient ID: Manuel Torres, male    DOB: [birth year], 57 y.o.   MRN: [id number]  Diabetes  He presents for his follow-up diabetic visit. He has type 2 diabetes mellitus. Pertinent negatives for hypoglycemia include no confusion, dizziness or headaches. Pertinent negatives for diabetes include no chest pain and no fatigue. Current diabetic treatments: metformin. He is compliant with treatment all of the time. Home blood sugar record trend: has not checked past few days but states it runs good. He does not see a podiatrist.Eye exam is not current (its been over one year).    Patient for blood pressure check up.  The patient does have hypertension.  The patient is on medication.  Patient relates compliance with meds. Todays BP reviewed with the patient. Patient denies issues with medication. Patient relates reasonable diet. Patient tries to minimize salt. Patient aware of BP goals.  The patient's BMI is calculated.  The patient does have obesity.  The patient does try to some degree staying active and watching diet.  It is in the vital signs and acknowledged.  It is above the recommended BMI for the patient's height and weight.  The patient has been counseled regarding healthy diet, restricted portions, avoiding excessive carbohydrates/sugary foods, and increase physical activity as health permits.  It is in the patient's best interest to lower the risk of secondary illness including heart disease strokes and cancer by losing weight.  The patient acknowledges this information.  Patient here for follow-up regarding cholesterol.  The patient does have hyperlipidemia.  Patient does try to maintain a reasonable diet.  Patient does take the medication on a regular basis.  Denies missing a dose.  The patient denies any obvious side effects.  Prior blood work results reviewed with the patient.  The patient is aware of his cholesterol goals and the need to keep it under good control to lessen the  risk of disease.   Sinus congestion. Started 3 days ago.  Had congestion off and on past few days denies high fever chills sweats Results for orders placed or performed in visit on 11/13/18  POCT glycosylated hemoglobin (Hb A1C)  Result Value Ref Range   Hemoglobin A1C 5.7 (A) 4.0 - 5.6 %   HbA1c POC (<> result, manual entry)     HbA1c, POC (prediabetic range)     HbA1c, POC (controlled diabetic range)        Review of Systems  Constitutional: Negative for diaphoresis and fatigue.  HENT: Negative for congestion and rhinorrhea.   Respiratory: Negative for cough and shortness of breath.   Cardiovascular: Negative for chest pain and leg swelling.  Gastrointestinal: Negative for abdominal pain and diarrhea.  Skin: Negative for color change and rash.  Neurological: Negative for dizziness and headaches.  Psychiatric/Behavioral: Negative for behavioral problems and confusion.       Objective:   Physical Exam Vitals signs reviewed.  Constitutional:      General: He is not in acute distress. HENT:     Head: Normocephalic and atraumatic.  Eyes:     General:        Right eye: No discharge.        Left eye: No discharge.  Neck:     Trachea: No tracheal deviation.  Cardiovascular:     Rate and Rhythm: Normal rate and regular rhythm.     Heart sounds: Normal heart sounds. No murmur.  Pulmonary:     Effort: Pulmonary effort is normal. No respiratory  distress.     Breath sounds: Normal breath sounds.  Lymphadenopathy:     Cervical: No cervical adenopathy.  Skin:    General: Skin is warm and dry.  Neurological:     Mental Status: He is alert.     Coordination: Coordination normal.  Psychiatric:        Behavior: Behavior normal.           Assessment & Plan:  Upper respiratory illness viral illness should gradually get better on its own follow-up if progressive troubles or worse hold off on any antibiotics  Blood pressure good control current measures continue current  medication  Hyperlipidemia previous labs look good needs to do new labs continue medication watch diet  Diabetes does well with taking the Metformin.  Watch his diet.  Stays physically active.  A1c overall looks like good control  Patient has chronic low back pain and discomfort for which unfortunately treatment has not been successful other than pain control he does try to do stretching exercises follow through with the specialist for that  25 minutes was spent with the patient.  This statement verifies that 25 minutes was indeed spent with the patient.  More than 50% of this visit-total duration of the visit-was spent in counseling and coordination of care. The issues that the patient came in for today as reflected in the diagnosis (s) please refer to documentation for further details.  Patient is trying hard to watch diet and exercise on a regular basis try to bring his weight down  Patient does take allopurinol regular basis to prevent gout attacks

## 2018-12-01 DIAGNOSIS — Z6837 Body mass index (BMI) 37.0-37.9, adult: Secondary | ICD-10-CM | POA: Diagnosis not present

## 2018-12-01 DIAGNOSIS — I10 Essential (primary) hypertension: Secondary | ICD-10-CM | POA: Diagnosis not present

## 2018-12-01 DIAGNOSIS — M5416 Radiculopathy, lumbar region: Secondary | ICD-10-CM | POA: Diagnosis not present

## 2018-12-01 DIAGNOSIS — M961 Postlaminectomy syndrome, not elsewhere classified: Secondary | ICD-10-CM | POA: Diagnosis not present

## 2019-01-01 DIAGNOSIS — M5416 Radiculopathy, lumbar region: Secondary | ICD-10-CM | POA: Diagnosis not present

## 2019-01-30 DIAGNOSIS — M961 Postlaminectomy syndrome, not elsewhere classified: Secondary | ICD-10-CM | POA: Diagnosis not present

## 2019-01-30 DIAGNOSIS — M47816 Spondylosis without myelopathy or radiculopathy, lumbar region: Secondary | ICD-10-CM | POA: Diagnosis not present

## 2019-01-30 DIAGNOSIS — Z6837 Body mass index (BMI) 37.0-37.9, adult: Secondary | ICD-10-CM | POA: Diagnosis not present

## 2019-01-30 DIAGNOSIS — I10 Essential (primary) hypertension: Secondary | ICD-10-CM | POA: Diagnosis not present

## 2019-03-05 ENCOUNTER — Other Ambulatory Visit: Payer: Self-pay | Admitting: Family Medicine

## 2019-04-15 ENCOUNTER — Other Ambulatory Visit: Payer: Self-pay | Admitting: Family Medicine

## 2019-05-15 ENCOUNTER — Other Ambulatory Visit: Payer: Self-pay

## 2019-05-15 ENCOUNTER — Ambulatory Visit (INDEPENDENT_AMBULATORY_CARE_PROVIDER_SITE_OTHER): Payer: BC Managed Care – PPO | Admitting: Family Medicine

## 2019-05-15 DIAGNOSIS — E785 Hyperlipidemia, unspecified: Secondary | ICD-10-CM | POA: Diagnosis not present

## 2019-05-15 DIAGNOSIS — M1A0790 Idiopathic chronic gout, unspecified ankle and foot, without tophus (tophi): Secondary | ICD-10-CM | POA: Diagnosis not present

## 2019-05-15 DIAGNOSIS — R7303 Prediabetes: Secondary | ICD-10-CM

## 2019-05-15 DIAGNOSIS — Z125 Encounter for screening for malignant neoplasm of prostate: Secondary | ICD-10-CM

## 2019-05-15 DIAGNOSIS — I10 Essential (primary) hypertension: Secondary | ICD-10-CM

## 2019-05-15 MED ORDER — HYDROCHLOROTHIAZIDE 25 MG PO TABS: 25.0000 mg | ORAL_TABLET | Freq: Every day | ORAL | 1 refills | Status: DC

## 2019-05-15 MED ORDER — ATORVASTATIN CALCIUM 40 MG PO TABS: 40.0000 mg | ORAL_TABLET | Freq: Every day | ORAL | 1 refills | Status: DC

## 2019-05-15 MED ORDER — POTASSIUM CHLORIDE CRYS ER 20 MEQ PO TBCR: 20.0000 meq | EXTENDED_RELEASE_TABLET | Freq: Every morning | ORAL | 1 refills | Status: DC

## 2019-05-15 MED ORDER — LISINOPRIL 10 MG PO TABS: 10.0000 mg | ORAL_TABLET | Freq: Every day | ORAL | 1 refills | Status: DC

## 2019-05-15 MED ORDER — METFORMIN HCL 500 MG PO TABS: ORAL_TABLET | ORAL | 1 refills | Status: DC

## 2019-05-15 MED ORDER — ALLOPURINOL 300 MG PO TABS: 300.0000 mg | ORAL_TABLET | Freq: Every day | ORAL | 1 refills | Status: DC

## 2019-05-15 NOTE — Progress Notes (Signed)
Subjective:    Patient ID: Manuel Torres, male    DOB: Mar 21, 1961, 58 y.o.   MRN: [id number]  Diabetes He presents for his follow-up diabetic visit. He has type 2 diabetes mellitus. Pertinent negatives for hypoglycemia include no confusion, dizziness or headaches. Pertinent negatives for diabetes include no chest pain and no fatigue. Risk factors for coronary artery disease include diabetes mellitus, dyslipidemia and hypertension. Current diabetic treatment includes oral agent (monotherapy). He is compliant with treatment all of the time. His weight is stable. He is following a diabetic diet.    Patient for blood pressure check up.  The patient does have hypertension.  The patient is on medication.  Patient relates compliance with meds. Todays BP reviewed with the patient. Patient denies issues with medication. Patient relates reasonable diet. Patient tries to minimize salt. Patient aware of BP goals.  Patient here for follow-up regarding cholesterol.  The patient does have hyperlipidemia.  Patient does try to maintain a reasonable diet.  Patient does take the medication on a regular basis.  Denies missing a dose.  The patient denies any obvious side effects.  Prior blood work results reviewed with the patient.  The patient is aware of his cholesterol goals and the need to keep it under good control to lessen the risk of disease.  The patient's BMI is calculated.  The patient does have obesity.  The patient does try to some degree staying active and watching diet.  It is in the vital signs and acknowledged.  It is above the recommended BMI for the patient's height and weight.  The patient has been counseled regarding healthy diet, restricted portions, avoiding excessive carbohydrates/sugary foods, and increase physical activity as health permits.  It is in the patient's best interest to lower the risk of secondary illness including heart disease strokes and cancer by losing weight.  The patient  acknowledges this information.  Patient does have underlying gout issue taking medication not had any flareups lately  25 minutes was spent with the patient.  This statement verifies that 25 minutes was indeed spent with the patient.  More than 50% of this visit-total duration of the visit-was spent in counseling and coordination of care. The issues that the patient came in for today as reflected in the diagnosis (s) please refer to documentation for further details.  Patient try doing best can watching diet staying physically active trying to keep his weight down used to go to the [facility] but with COVID the gym is not open Virtual Visit via Video Note  I connected with Manuel Torres on 05/15/19 at  2:00 PM EDT by a video enabled telemedicine application and verified that I am speaking with the correct person using two identifiers.  Location: Patient: home Provider: office   I discussed the limitations of evaluation and management by telemedicine and the availability of in person appointments. The patient expressed understanding and agreed to proceed.  History of Present Illness:    Observations/Objective:   Assessment and Plan:   Follow Up Instructions:    I discussed the assessment and treatment plan with the patient. The patient was provided an opportunity to ask questions and all were answered. The patient agreed with the plan and demonstrated an understanding of the instructions.   The patient was advised to call back or seek an in-person evaluation if the symptoms worsen or if the condition fails to improve as anticipated.  I provided 25 minutes of non-face-to-face time during this encounter.  Review of Systems  Constitutional: Negative for diaphoresis and fatigue.  HENT: Negative for congestion and rhinorrhea.   Respiratory: Negative for cough and shortness of breath.   Cardiovascular: Negative for chest pain and leg swelling.  Gastrointestinal: Negative for  abdominal pain and diarrhea.  Skin: Negative for color change and rash.  Neurological: Negative for dizziness and headaches.  Psychiatric/Behavioral: Negative for behavioral problems and confusion.       Objective:   Physical Exam  Today's visit was via telephone Physical exam was not possible for this visit       Assessment & Plan:  1. Essential hypertension, benign Taking his medication watching diet trying to stay active states readings under good control  2. Hyperlipidemia, unspecified hyperlipidemia type Takes his medicine regular basis watch his diet he is due for lab work previous labs reviewed  3. 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 Prediabetes Trying to do the best he can at losing weight watching diet staying active patient is disabled states sugars under good control as best he notes check labs  5. Morbid obesity ([place]) Patient watching diet watching intake trying to keep things under good control

## 2019-05-21 ENCOUNTER — Other Ambulatory Visit: Payer: Self-pay | Admitting: Family Medicine

## 2020-01-18 DIAGNOSIS — Z471 Aftercare following joint replacement surgery: Secondary | ICD-10-CM | POA: Diagnosis not present

## 2020-01-18 DIAGNOSIS — Z96641 Presence of right artificial hip joint: Secondary | ICD-10-CM | POA: Diagnosis not present

## 2020-01-18 DIAGNOSIS — Z96643 Presence of artificial hip joint, bilateral: Secondary | ICD-10-CM | POA: Diagnosis not present

## 2020-01-18 DIAGNOSIS — Z96642 Presence of left artificial hip joint: Secondary | ICD-10-CM | POA: Diagnosis not present

## 2020-01-26 ENCOUNTER — Ambulatory Visit: Payer: BC Managed Care – PPO | Attending: Internal Medicine

## 2020-01-26 DIAGNOSIS — Z23 Encounter for immunization: Secondary | ICD-10-CM

## 2020-01-26 NOTE — Progress Notes (Signed)
   Covid-19 Vaccination Clinic  Name:  PRODIGY DUBOW    MRN: [id number] DOB: [birth year]  01/26/2020  Mr. Slavey was observed post Covid-19 immunization for 30 minutes based on pre-vaccination screening without incident. He was provided with Vaccine Information Sheet and instruction to access the V-Safe system.   Mr. Metter was instructed to call 911 with any severe reactions post vaccine: Marland Kitchen Difficulty breathing  . Swelling of face and throat  . A fast heartbeat  . A bad rash all over body  . Dizziness and weakness   Immunizations Administered    Name Date Dose VIS Date Route   Moderna COVID-19 Vaccine 01/26/2020 10:29 AM 0.5 mL 10/16/2019 Intramuscular   Manufacturer: Moderna   Lot: [id number]   [id number]

## 2020-02-27 ENCOUNTER — Ambulatory Visit: Payer: BC Managed Care – PPO | Attending: Internal Medicine

## 2020-02-27 DIAGNOSIS — Z23 Encounter for immunization: Secondary | ICD-10-CM

## 2020-02-27 NOTE — Progress Notes (Signed)
   Covid-19 Vaccination Clinic  Name:  Manuel Torres    MRN: [id number] DOB: [birth year]  02/27/2020  [name] was observed post Covid-19 immunization for 30 minutes based on pre-vaccination screening without incident. He was provided with Vaccine Information Sheet and instruction to access the V-Safe system.   [name] was instructed to call 911 with any severe reactions post vaccine: Marland Kitchen Difficulty breathing  . Swelling of face and throat  . A fast heartbeat  . A bad rash all over body  . Dizziness and weakness   Immunizations Administered    Name Date Dose VIS Date Route   Moderna COVID-19 Vaccine 02/27/2020  9:31 AM 0.5 mL 10/16/2019 Intramuscular   Manufacturer: Moderna   Lot: [id number]   [id number]

## 2020-03-03 ENCOUNTER — Other Ambulatory Visit: Payer: Self-pay | Admitting: Family Medicine

## 2020-03-03 NOTE — Telephone Encounter (Signed)
Scheduled 7/6

## 2020-03-03 NOTE — Telephone Encounter (Signed)
90-day mainly needs to do a follow-up office visit

## 2020-03-06 ENCOUNTER — Other Ambulatory Visit: Payer: Self-pay | Admitting: Family Medicine

## 2020-03-21 ENCOUNTER — Other Ambulatory Visit: Payer: Self-pay | Admitting: Family Medicine

## 2020-04-18 ENCOUNTER — Other Ambulatory Visit: Payer: Self-pay | Admitting: Family Medicine

## 2020-04-18 NOTE — Telephone Encounter (Signed)
Pt already scheduled for 7/6

## 2020-04-22 ENCOUNTER — Other Ambulatory Visit: Payer: Self-pay | Admitting: Family Medicine

## 2020-05-13 DIAGNOSIS — H524 Presbyopia: Secondary | ICD-10-CM | POA: Diagnosis not present

## 2020-05-13 DIAGNOSIS — Z0100 Encounter for examination of eyes and vision without abnormal findings: Secondary | ICD-10-CM | POA: Diagnosis not present

## 2020-05-20 ENCOUNTER — Encounter: Payer: Self-pay | Admitting: Family Medicine

## 2020-05-20 ENCOUNTER — Other Ambulatory Visit: Payer: Self-pay

## 2020-05-20 ENCOUNTER — Ambulatory Visit (INDEPENDENT_AMBULATORY_CARE_PROVIDER_SITE_OTHER): Payer: BC Managed Care – PPO | Admitting: Family Medicine

## 2020-05-20 VITALS — BP 130/80 | Temp 97.8°F | Wt 256.0 lb

## 2020-05-20 DIAGNOSIS — R7303 Prediabetes: Secondary | ICD-10-CM

## 2020-05-20 DIAGNOSIS — F320 Major depressive disorder, single episode, mild: Secondary | ICD-10-CM

## 2020-05-20 DIAGNOSIS — Z125 Encounter for screening for malignant neoplasm of prostate: Secondary | ICD-10-CM | POA: Diagnosis not present

## 2020-05-20 DIAGNOSIS — M6530 Trigger finger, unspecified finger: Secondary | ICD-10-CM

## 2020-05-20 DIAGNOSIS — M1A0790 Idiopathic chronic gout, unspecified ankle and foot, without tophus (tophi): Secondary | ICD-10-CM | POA: Diagnosis not present

## 2020-05-20 DIAGNOSIS — I10 Essential (primary) hypertension: Secondary | ICD-10-CM

## 2020-05-20 DIAGNOSIS — E785 Hyperlipidemia, unspecified: Secondary | ICD-10-CM | POA: Diagnosis not present

## 2020-05-20 MED ORDER — METFORMIN HCL 500 MG PO TABS: ORAL_TABLET | ORAL | 1 refills | Status: DC

## 2020-05-20 MED ORDER — LISINOPRIL 10 MG PO TABS: 10.0000 mg | ORAL_TABLET | Freq: Every day | ORAL | 1 refills | Status: DC

## 2020-05-20 MED ORDER — ALLOPURINOL 300 MG PO TABS: 300.0000 mg | ORAL_TABLET | Freq: Every day | ORAL | 1 refills | Status: DC

## 2020-05-20 MED ORDER — POTASSIUM CHLORIDE CRYS ER 20 MEQ PO TBCR: 20.0000 meq | EXTENDED_RELEASE_TABLET | Freq: Every morning | ORAL | 1 refills | Status: DC

## 2020-05-20 MED ORDER — SERTRALINE HCL 100 MG PO TABS: 100.0000 mg | ORAL_TABLET | Freq: Every day | ORAL | 1 refills | Status: DC

## 2020-05-20 MED ORDER — SERTRALINE HCL 50 MG PO TABS: 50.0000 mg | ORAL_TABLET | Freq: Every day | ORAL | 1 refills | Status: DC

## 2020-05-20 MED ORDER — GABAPENTIN 300 MG PO CAPS: 300.0000 mg | ORAL_CAPSULE | Freq: Two times a day (BID) | ORAL | 1 refills | Status: DC

## 2020-05-20 MED ORDER — HYDROCHLOROTHIAZIDE 25 MG PO TABS: 25.0000 mg | ORAL_TABLET | Freq: Every day | ORAL | 1 refills | Status: DC

## 2020-05-20 MED ORDER — ATORVASTATIN CALCIUM 40 MG PO TABS: 40.0000 mg | ORAL_TABLET | Freq: Every day | ORAL | 1 refills | Status: DC

## 2020-05-20 NOTE — Progress Notes (Signed)
Subjective:    Patient ID: Manuel Torres, male    DOB: May 29, 1961, 59 y.o.   MRN: [id number]  Diabetes He presents for his follow-up diabetic visit. He has type 2 diabetes mellitus. Pertinent negatives for hypoglycemia include no confusion, dizziness or headaches. Pertinent negatives for diabetes include no chest pain and no fatigue. There are no diabetic complications. Diabetic current diet: diet is improved. Walking regularly. He does not see a podiatrist.Eye exam is current.  Hypertension Pertinent negatives include no chest pain, headaches or shortness of breath.  Hyperlipidemia Pertinent negatives include no chest pain or shortness of breath.  Essential hypertension, benign - Plan: Comprehensive metabolic panel  Hyperlipidemia, unspecified hyperlipidemia type - Plan: Lipid panel  Prediabetes - Plan: Microalbumin / creatinine urine ratio, Hemoglobin A1c  Idiopathic chronic gout of foot without tophus, unspecified laterality - Plan: Uric acid  Depression, major, single episode, mild ([place]) - Plan: Ambulatory referral to Psychology  Screening for prostate cancer - Plan: PSA  Trigger finger of right hand, unspecified finger - Plan: Ambulatory referral to Hand Surgery   Patient complains of finger locking up on left hand for a while now.   Patient is the patient not suicidal.  Patient dealing with the murder related to his son's death 5 years ago dealing with depression  Patient does try to exercise some trying to watch diet.  Trying to stay physically active. Patient does have underlying morbid obesity Review of Systems  Constitutional: Negative for diaphoresis and fatigue.  HENT: Negative for congestion and rhinorrhea.   Respiratory: Negative for cough and shortness of breath.   Cardiovascular: Negative for chest pain and leg swelling.  Gastrointestinal: Negative for abdominal pain and diarrhea.  Skin: Negative for color change and rash.  Neurological: Negative for  dizziness and headaches.  Psychiatric/Behavioral: Negative for behavioral problems and confusion.       Objective:   Physical Exam Vitals reviewed.  Constitutional:      General: He is not in acute distress. HENT:     Head: Normocephalic and atraumatic.  Eyes:     General:        Right eye: No discharge.        Left eye: No discharge.  Neck:     Trachea: No tracheal deviation.  Cardiovascular:     Rate and Rhythm: Normal rate and regular rhythm.     Heart sounds: Normal heart sounds. No murmur heard.   Pulmonary:     Effort: Pulmonary effort is normal. No respiratory distress.     Breath sounds: Normal breath sounds.  Lymphadenopathy:     Cervical: No cervical adenopathy.  Skin:    General: Skin is warm and dry.  Neurological:     Mental Status: He is alert.     Coordination: Coordination normal.  Psychiatric:        Behavior: Behavior normal.           Assessment & Plan:  1. Essential hypertension, benign Blood pressure - Comprehe blood pressure under good controlnsive metabolic panel  2. Hyperlipidemia, unspecified hyperlipidemia type Tries to watch diet takes medication - Lipid panel  3. Prediabetes Prediabetes tries to keep A1c under good control.  We will check lab work - Microalbumin / creatinine urine ratio - Hemoglobin A1c  4. 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 Depression, major, single episode, mild ([facility]) Increase Zoloft to a higher dose recommend counseling for depression patient  follow-up sooner problems otherwise follow-up in a few months - Ambulatory referral to Psychology  6. Screening for prostate cancer Patient to do a wellness within the coming months - PSA  7. Trigger finger of right hand, unspecified finger Referral to specialist - Ambulatory referral to Hand Surgery  8. Morbid obesity ([place]) Watch diet stay active try to lose weight

## 2020-05-27 ENCOUNTER — Encounter: Payer: Self-pay | Admitting: Family Medicine

## 2020-05-27 DIAGNOSIS — I10 Essential (primary) hypertension: Secondary | ICD-10-CM | POA: Diagnosis not present

## 2020-05-27 DIAGNOSIS — Z125 Encounter for screening for malignant neoplasm of prostate: Secondary | ICD-10-CM | POA: Diagnosis not present

## 2020-05-27 DIAGNOSIS — E785 Hyperlipidemia, unspecified: Secondary | ICD-10-CM | POA: Diagnosis not present

## 2020-05-27 DIAGNOSIS — R7303 Prediabetes: Secondary | ICD-10-CM | POA: Diagnosis not present

## 2020-05-27 DIAGNOSIS — M1A0790 Idiopathic chronic gout, unspecified ankle and foot, without tophus (tophi): Secondary | ICD-10-CM | POA: Diagnosis not present

## 2020-05-28 LAB — COMPREHENSIVE METABOLIC PANEL
ALT: 43 IU/L (ref 0–44)
AST: 59 IU/L — ABNORMAL HIGH (ref 0–40)
Albumin/Globulin Ratio: 1.5 (ref 1.2–2.2)
Albumin: 4.1 g/dL (ref 3.8–4.9)
Alkaline Phosphatase: 94 IU/L (ref 48–121)
BUN/Creatinine Ratio: 13 (ref 10–24)
BUN: 11 mg/dL (ref 8–27)
Bilirubin Total: 0.6 mg/dL (ref 0.0–1.2)
CO2: 21 mmol/L (ref 20–29)
Calcium: 9.3 mg/dL (ref 8.6–10.2)
Chloride: 106 mmol/L (ref 96–106)
Creatinine, Ser: 0.83 mg/dL (ref 0.76–1.27)
GFR calc Af Amer: 111 mL/min/{1.73_m2} (ref >59)
GFR calc non Af Amer: 96 mL/min/{1.73_m2} (ref >59)
Globulin, Total: 2.8 g/dL (ref 1.5–4.5)
Glucose: 104 mg/dL — ABNORMAL HIGH (ref 65–99)
Potassium: 4.5 mmol/L (ref 3.5–5.2)
Sodium: 141 mmol/L (ref 134–144)
Total Protein: 6.9 g/dL (ref 6.0–8.5)

## 2020-05-28 LAB — LIPID PANEL
Chol/HDL Ratio: 3.5 ratio (ref 0.0–5.0)
Cholesterol, Total: 139 mg/dL (ref 100–199)
HDL: 40 mg/dL (ref >39)
LDL Chol Calc (NIH): 82 mg/dL (ref 0–99)
Triglycerides: 92 mg/dL (ref 0–149)
VLDL Cholesterol Cal: 17 mg/dL (ref 5–40)

## 2020-05-28 LAB — HEMOGLOBIN A1C
Est. average glucose Bld gHb Est-mCnc: 128 mg/dL
Hgb A1c MFr Bld: 6.1 % — ABNORMAL HIGH (ref 4.8–5.6)

## 2020-05-28 LAB — PSA: Prostate Specific Ag, Serum: 1.2 ng/mL (ref 0.0–4.0)

## 2020-05-28 LAB — MICROALBUMIN / CREATININE URINE RATIO
Creatinine, Urine: 252.4 mg/dL
Microalb/Creat Ratio: 5 mg/g creat (ref 0–29)
Microalbumin, Urine: 13.8 ug/mL

## 2020-05-28 LAB — URIC ACID: Uric Acid: 6.6 mg/dL (ref 3.8–8.4)

## 2020-06-01 ENCOUNTER — Encounter: Payer: Self-pay | Admitting: Family Medicine

## 2020-06-03 ENCOUNTER — Other Ambulatory Visit: Payer: Self-pay | Admitting: *Deleted

## 2020-06-03 DIAGNOSIS — I77811 Abdominal aortic ectasia: Secondary | ICD-10-CM

## 2020-06-04 ENCOUNTER — Encounter: Payer: Self-pay | Admitting: Family Medicine

## 2020-06-09 ENCOUNTER — Telehealth: Payer: Self-pay | Admitting: *Deleted

## 2020-06-09 NOTE — Telephone Encounter (Signed)
Patient called in today with questions about his upcoming scan. Patient advised this was a follow up on scan in 2017 of an abdominal aorta. Patient seemed very confused and does not remember hearing about this in the past and would like Dr. Nicki Reaper to let him know if there's anything he should be doing differently or is he should be concerned.

## 2020-06-10 NOTE — Telephone Encounter (Signed)
Nurses Back in 2017 when patient had ultrasound of the abdomen He did not have a aneurysm but his aorta was within the upper limits of normal. Being a guy and overweight this puts him at risk of developing an aneurysm. Therefore by protocols it is recommended to do a follow-up ultrasound 5 years later Typically we do a ultrasound follow-up for years later This is purely to make sure that he is not developing an aneurysm More than likely this test will come back normal but it is recommended as part of the protocol to do a follow-up on this to make sure it is staying normal. I recommend setting him up for a ultrasound of the aorta If he has further questions please let me know Diagnosis ectasia of the aorta

## 2020-06-10 NOTE — Telephone Encounter (Signed)
Lmtc

## 2020-06-10 NOTE — Telephone Encounter (Signed)
Patient notified and verbalized understanding and will have the U/S completed as scheduled.

## 2020-06-12 ENCOUNTER — Other Ambulatory Visit: Payer: Self-pay

## 2020-06-12 ENCOUNTER — Ambulatory Visit (HOSPITAL_COMMUNITY)
Admission: RE | Admit: 2020-06-12 | Discharge: 2020-06-12 | Disposition: A | Discharge status: Home / Self Care | Payer: BC Managed Care – PPO | Source: Ambulatory Visit | Attending: Family Medicine | Admitting: Family Medicine

## 2020-06-12 DIAGNOSIS — R935 Abnormal findings on diagnostic imaging of other abdominal regions, including retroperitoneum: Secondary | ICD-10-CM | POA: Insufficient documentation

## 2020-06-12 DIAGNOSIS — I77811 Abdominal aortic ectasia: Secondary | ICD-10-CM | POA: Diagnosis not present

## 2020-06-20 DIAGNOSIS — M65342 Trigger finger, left ring finger: Secondary | ICD-10-CM | POA: Diagnosis not present

## 2020-06-23 ENCOUNTER — Ambulatory Visit (INDEPENDENT_AMBULATORY_CARE_PROVIDER_SITE_OTHER): Payer: BC Managed Care – PPO | Admitting: Family Medicine

## 2020-06-23 ENCOUNTER — Other Ambulatory Visit: Payer: Self-pay

## 2020-06-23 VITALS — BP 132/82 | Temp 97.6°F | Ht 70.0 in | Wt 252.2 lb

## 2020-06-23 DIAGNOSIS — Z0000 Encounter for general adult medical examination without abnormal findings: Secondary | ICD-10-CM

## 2020-06-23 DIAGNOSIS — Z23 Encounter for immunization: Secondary | ICD-10-CM

## 2020-06-23 DIAGNOSIS — R7401 Elevation of levels of liver transaminase levels: Secondary | ICD-10-CM

## 2020-06-23 DIAGNOSIS — Z79899 Other long term (current) drug therapy: Secondary | ICD-10-CM

## 2020-06-23 DIAGNOSIS — I10 Essential (primary) hypertension: Secondary | ICD-10-CM | POA: Diagnosis not present

## 2020-06-23 DIAGNOSIS — Z0001 Encounter for general adult medical examination with abnormal findings: Secondary | ICD-10-CM | POA: Diagnosis not present

## 2020-06-23 DIAGNOSIS — E785 Hyperlipidemia, unspecified: Secondary | ICD-10-CM | POA: Diagnosis not present

## 2020-06-23 MED ORDER — SILDENAFIL CITRATE 100 MG PO TABS
ORAL_TABLET | ORAL | 2 refills | Status: DC
Start: 2020-06-23 — End: 2020-06-23

## 2020-06-23 MED ORDER — ATORVASTATIN CALCIUM 80 MG PO TABS
80.0000 mg | ORAL_TABLET | Freq: Every day | ORAL | 3 refills | Status: DC
Start: 2020-06-23 — End: 2022-04-07

## 2020-06-23 MED ORDER — SILDENAFIL CITRATE 100 MG PO TABS: ORAL_TABLET | ORAL | 2 refills | Status: DC

## 2020-06-23 NOTE — Progress Notes (Signed)
Subjective:    Patient ID: Manuel Torres, male    DOB: Aug 13, 1961, 59 y.o.   MRN: [id number]  HPI  The patient comes in today for a wellness visit.  This patient is working hard to try to lose weight He is doing some exercise In addition to this walking 2 miles each morning Does have some hip pain at times Patient states he is tolerating the exercise Denies any chest tightness pressure pain Try to watch portions Try to be safe about things He does relate occasional alcohol occasional sugary drinks  A review of their health history was completed.  A review of medications was also completed.  Any needed refills; none  Eating habits: getting better  Falls/  MVA accidents in past few months: none  Regular exercise: walking  Specialist pt sees on regular basis:not regularly  Preventative health issues were discussed.   Additional concerns: none   Review of Systems  Constitutional: Negative for diaphoresis and fatigue.  HENT: Negative for congestion and rhinorrhea.   Respiratory: Negative for cough and shortness of breath.   Cardiovascular: Negative for chest pain and leg swelling.  Gastrointestinal: Negative for abdominal pain and diarrhea.  Skin: Negative for color change and rash.  Neurological: Negative for dizziness and headaches.  Psychiatric/Behavioral: Negative for behavioral problems and confusion.       Objective:   Physical Exam Vitals reviewed.  Constitutional:      General: He is not in acute distress. HENT:     Head: Normocephalic and atraumatic.  Eyes:     General:        Right eye: No discharge.        Left eye: No discharge.  Neck:     Trachea: No tracheal deviation.  Cardiovascular:     Rate and Rhythm: Normal rate and regular rhythm.     Heart sounds: Normal heart sounds. No murmur heard.   Pulmonary:     Effort: Pulmonary effort is normal. No respiratory distress.     Breath sounds: Normal breath sounds.  Lymphadenopathy:      Cervical: No cervical adenopathy.  Skin:    General: Skin is warm and dry.  Neurological:     Mental Status: He is alert.     Coordination: Coordination normal.  Psychiatric:        Behavior: Behavior normal.     Prostate exam normal      Assessment & Plan:  1. Need for vaccination Due to previous diabetes diagnosis in the past I recommend pneumococcal vaccine - Pneumococcal polysaccharide vaccine 23-valent greater than or equal to 2yo subcutaneous/IM  2. Hyperlipidemia, unspecified hyperlipidemia type LDL not where it needs to be.  Increase atorvastatin 80 mg recommended daily notify us if any problems recheck lipid liver in 8 to 12 weeks - Lipid panel  3. Essential hypertension, benign Blood pressure good control continue current measures - Lipid panel - Hepatic function panel  4. High risk medication use Check liver functions see above - Hepatic function panel  5. Well adult exam Adult wellness-complete.wellness physical was conducted today. Importance of diet and exercise were discussed in detail.  In addition to this a discussion regarding safety was also covered. We also reviewed over immunizations and gave recommendations regarding current immunization needed for age.  In addition to this additional areas were also touched on including: Preventative health exams needed:  Colonoscopy not due until 2023  Patient was advised yearly wellness exam   6. Elevated transaminase level Slight  elevation more likely fatty liver patient will do a liver profile again if progressive will need follow-up ultrasound of the liver  7. Morbid obesity ([place]) Healthy diet regular portion control and exercise recommended patient is losing some weight  Patient had recent ultrasound did show abdominal aorta ectasia no need to do any type of additional scans currently repeat in 5 years patient is aware

## 2020-07-14 DIAGNOSIS — H401221 Low-tension glaucoma, left eye, mild stage: Secondary | ICD-10-CM | POA: Diagnosis not present

## 2020-08-08 ENCOUNTER — Telehealth: Payer: Self-pay | Admitting: Family Medicine

## 2020-08-08 NOTE — Telephone Encounter (Signed)
Pt's wife dropped off renewal for handicap sticker. Form placed in providers box in office.

## 2020-08-10 NOTE — Telephone Encounter (Signed)
Form was signed please complete then forward to the patient thank you

## 2020-08-27 DIAGNOSIS — Z79899 Other long term (current) drug therapy: Secondary | ICD-10-CM | POA: Diagnosis not present

## 2020-08-27 DIAGNOSIS — E785 Hyperlipidemia, unspecified: Secondary | ICD-10-CM | POA: Diagnosis not present

## 2020-08-27 DIAGNOSIS — I10 Essential (primary) hypertension: Secondary | ICD-10-CM | POA: Diagnosis not present

## 2020-08-28 LAB — HEPATIC FUNCTION PANEL
ALT: 43 IU/L (ref 0–44)
AST: 66 IU/L — ABNORMAL HIGH (ref 0–40)
Albumin: 4.3 g/dL (ref 3.8–4.9)
Alkaline Phosphatase: 90 IU/L (ref 44–121)
Bilirubin Total: 0.6 mg/dL (ref 0.0–1.2)
Bilirubin, Direct: 0.19 mg/dL (ref 0.00–0.40)
Total Protein: 7 g/dL (ref 6.0–8.5)

## 2020-08-28 LAB — LIPID PANEL
Chol/HDL Ratio: 3.3 ratio (ref 0.0–5.0)
Cholesterol, Total: 126 mg/dL (ref 100–199)
HDL: 38 mg/dL — ABNORMAL LOW (ref >39)
LDL Chol Calc (NIH): 74 mg/dL (ref 0–99)
Triglycerides: 67 mg/dL (ref 0–149)
VLDL Cholesterol Cal: 14 mg/dL (ref 5–40)

## 2020-09-15 DIAGNOSIS — H40013 Open angle with borderline findings, low risk, bilateral: Secondary | ICD-10-CM | POA: Diagnosis not present

## 2020-09-16 ENCOUNTER — Other Ambulatory Visit: Payer: Self-pay | Admitting: Family Medicine

## 2020-11-11 ENCOUNTER — Other Ambulatory Visit: Payer: Self-pay | Admitting: Family Medicine

## 2020-12-22 ENCOUNTER — Other Ambulatory Visit: Payer: Self-pay | Admitting: Family Medicine

## 2021-01-05 ENCOUNTER — Other Ambulatory Visit: Payer: Self-pay

## 2021-01-05 ENCOUNTER — Telehealth (HOSPITAL_COMMUNITY): Payer: Self-pay | Admitting: Clinical

## 2021-01-05 ENCOUNTER — Ambulatory Visit (HOSPITAL_COMMUNITY): Payer: Medicare HMO | Admitting: Clinical

## 2021-01-05 NOTE — Telephone Encounter (Signed)
The patient did not respond to video link, phone call, or VM 

## 2021-02-14 ENCOUNTER — Other Ambulatory Visit: Payer: Self-pay | Admitting: Family Medicine

## 2021-02-15 ENCOUNTER — Telehealth: Payer: Self-pay | Admitting: Family Medicine

## 2021-02-15 DIAGNOSIS — F320 Major depressive disorder, single episode, mild: Secondary | ICD-10-CM

## 2021-02-16 NOTE — Telephone Encounter (Signed)
May have 90-day needs follow-up visit

## 2021-02-17 NOTE — Telephone Encounter (Signed)
Sent my chart message to schedule appointment.

## 2021-02-19 NOTE — Telephone Encounter (Signed)
Called no answer

## 2021-02-24 NOTE — Telephone Encounter (Signed)
Called No Answer

## 2021-03-05 NOTE — Telephone Encounter (Signed)
Called twice and sent my chart message no response

## 2021-03-16 DIAGNOSIS — H2513 Age-related nuclear cataract, bilateral: Secondary | ICD-10-CM | POA: Diagnosis not present

## 2021-05-20 LAB — HM DIABETES EYE EXAM

## 2021-05-21 DIAGNOSIS — H524 Presbyopia: Secondary | ICD-10-CM | POA: Diagnosis not present

## 2021-05-27 ENCOUNTER — Encounter: Payer: Self-pay | Admitting: Family Medicine

## 2021-05-27 ENCOUNTER — Other Ambulatory Visit: Payer: Self-pay | Admitting: Family Medicine

## 2021-05-27 DIAGNOSIS — F320 Major depressive disorder, single episode, mild: Secondary | ICD-10-CM

## 2021-05-27 NOTE — Telephone Encounter (Signed)
Last seen 06/23/20

## 2021-05-28 NOTE — Telephone Encounter (Signed)
30-day with 1 refill needs follow-up office visit

## 2021-06-12 ENCOUNTER — Other Ambulatory Visit: Payer: Self-pay | Admitting: Family Medicine

## 2021-06-12 DIAGNOSIS — F320 Major depressive disorder, single episode, mild: Secondary | ICD-10-CM

## 2021-06-19 ENCOUNTER — Other Ambulatory Visit: Payer: Self-pay | Admitting: Family Medicine

## 2021-10-22 DIAGNOSIS — H401221 Low-tension glaucoma, left eye, mild stage: Secondary | ICD-10-CM | POA: Diagnosis not present

## 2022-03-01 IMAGING — US US AORTA
1 series · 14 of 20 positions shown · non-contrast
Comparison: 4073

CLINICAL DATA: Abdominal aortic ectasia, follow-up

EXAM:
ULTRASOUND OF ABDOMINAL AORTA
TECHNIQUE: Ultrasound examination of the abdominal aorta and proximal common
iliac arteries was performed to evaluate for aneurysm. Additional
color and Doppler images of the distal aorta were obtained to
document patency.

[Series 1: us aorta · 14 of 20 slices shown]
[im 1/20]
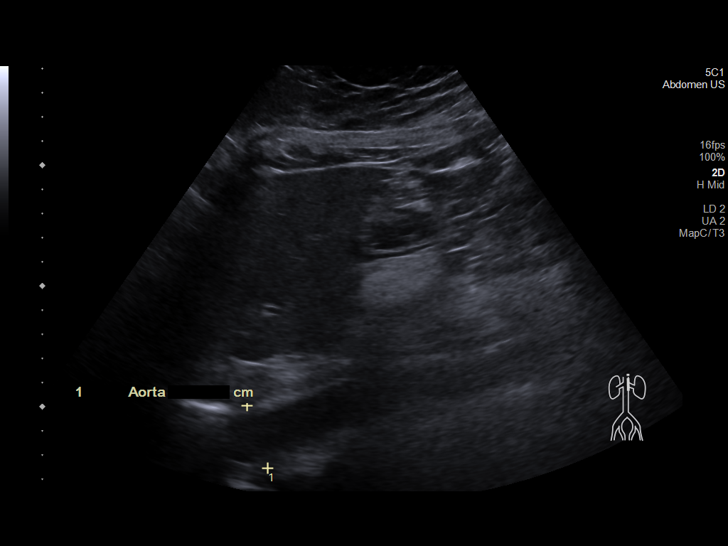
[im 3/20]
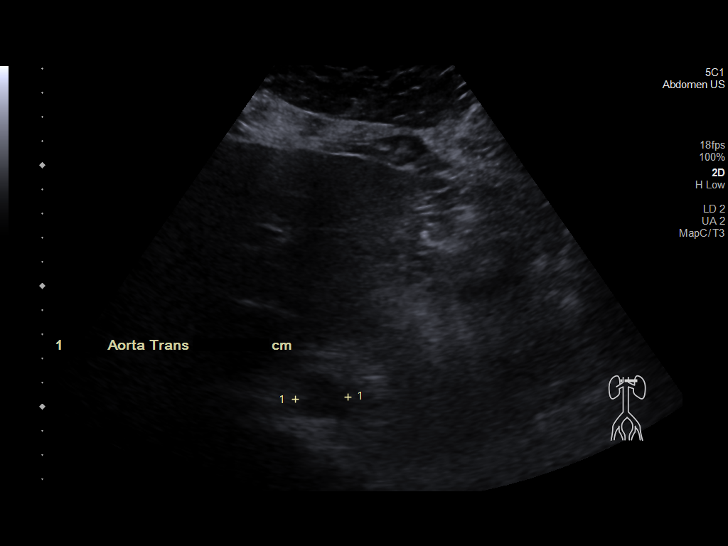
[im 4/20]
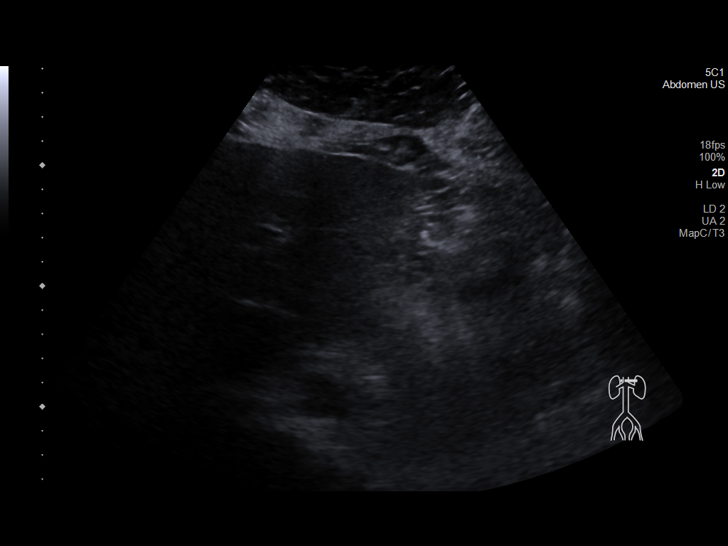
[im 6/20]
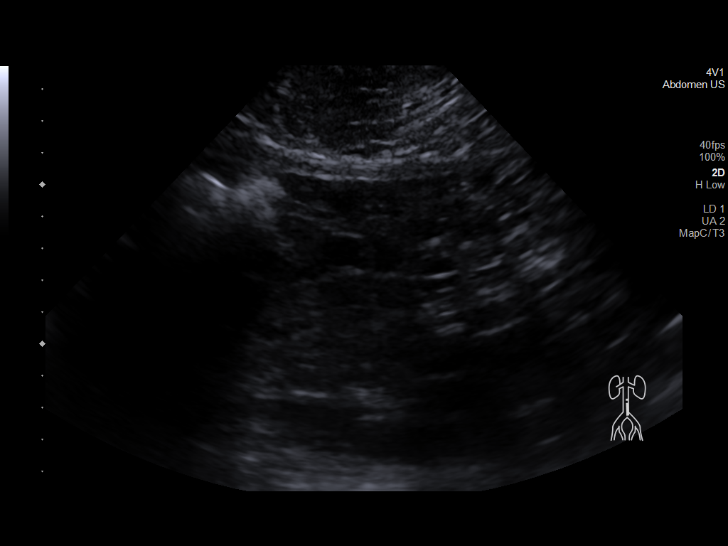
[im 7/20]
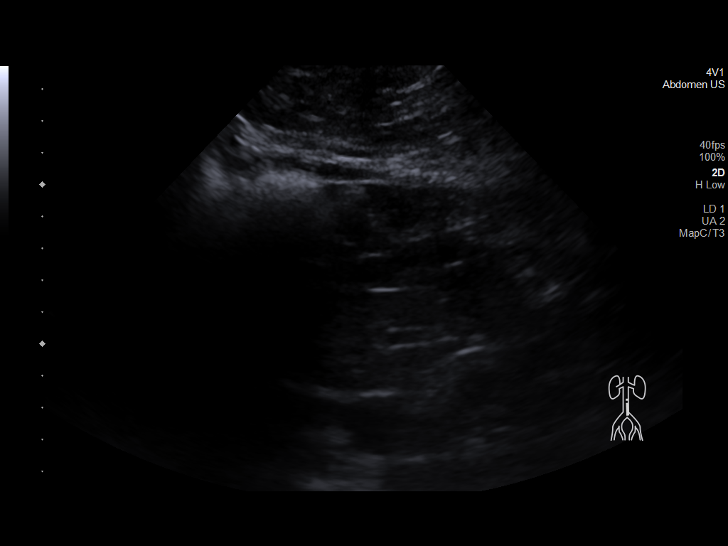
[im 8/20]
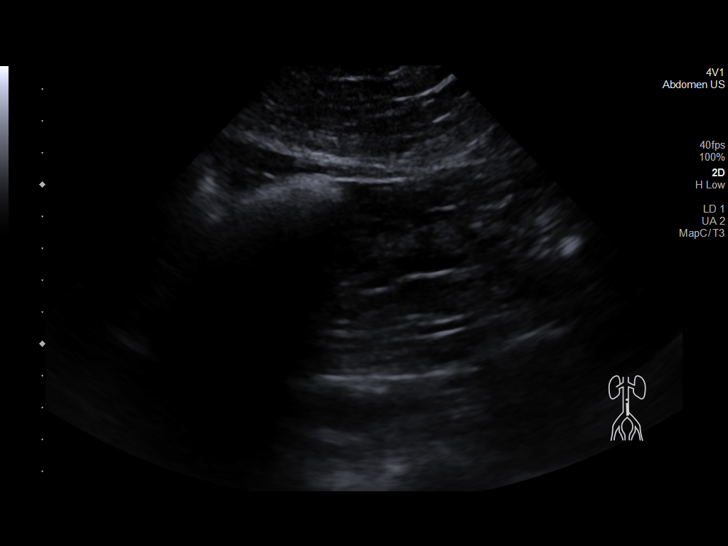
[im 10/20]
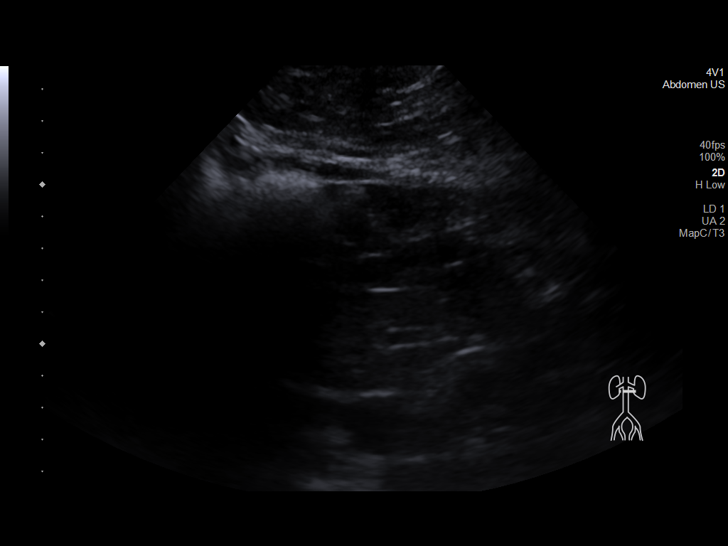
[im 11/20]
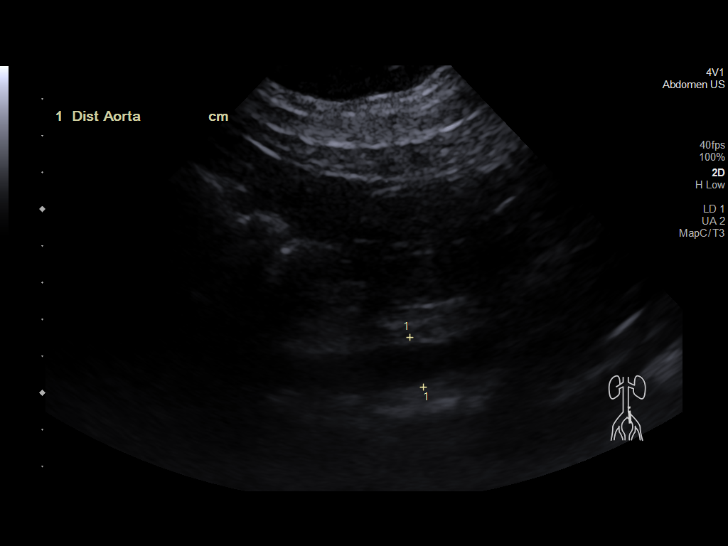
[im 13/20]
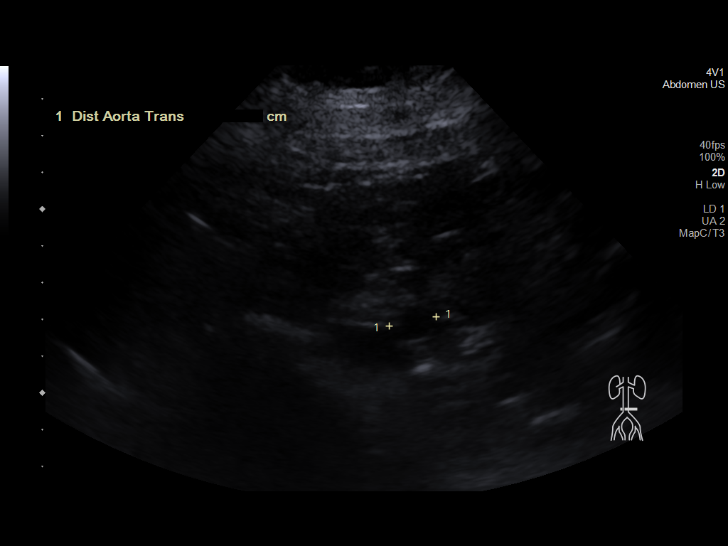
[im 14/20]
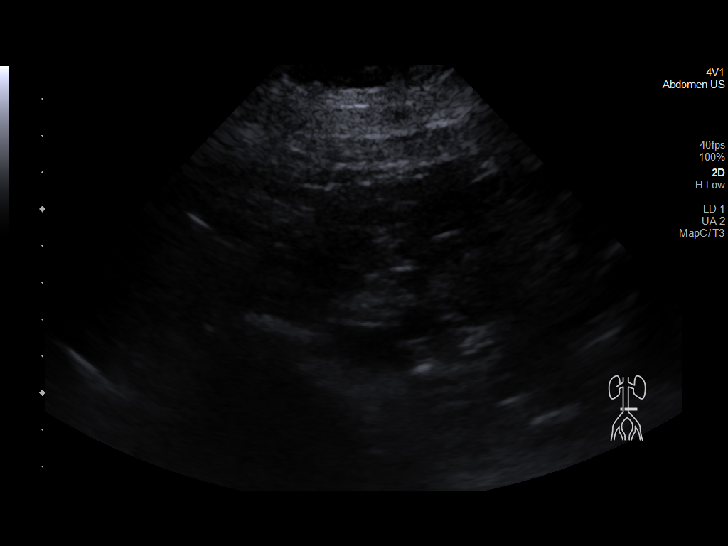
[im 16/20]
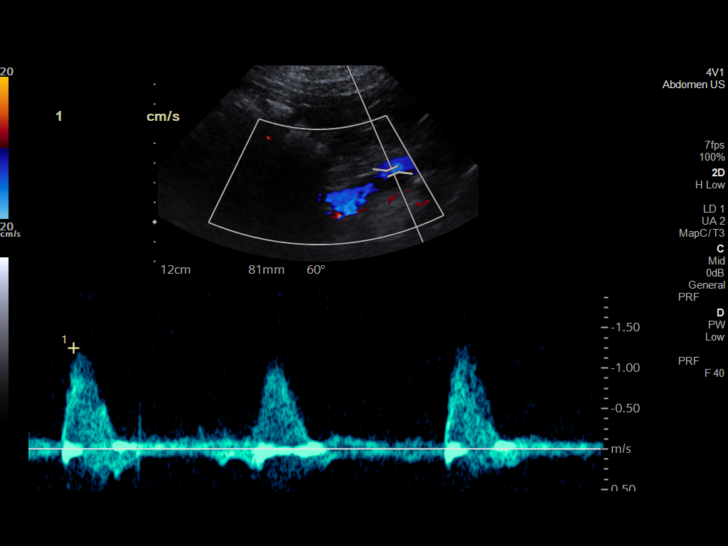
[im 17/20]
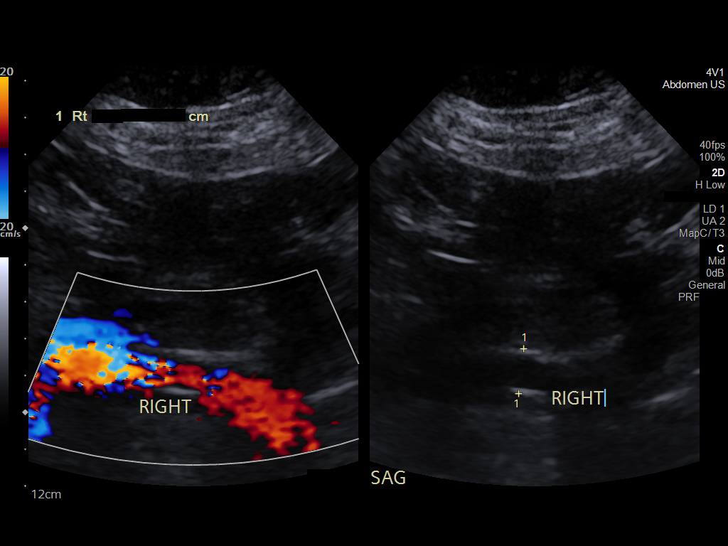
[im 18/20]
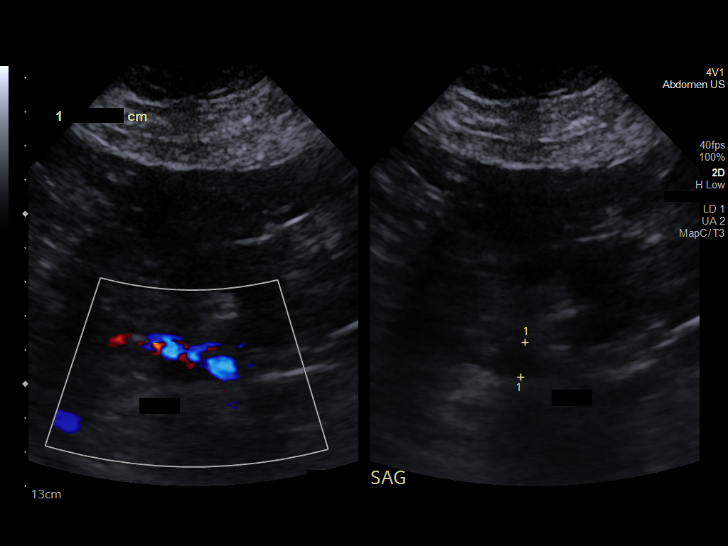
[im 20/20]
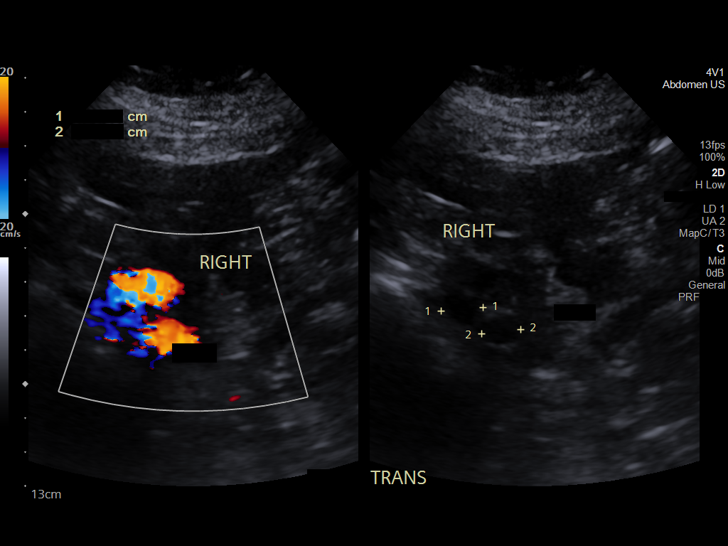

[14 of 20 positions shown; findings below may reference images not displayed]

FINDINGS: Abdominal aortic measurements as follows:

Proximal:  2.7 cm

Mid:  1.7 cm

Distal:  1.4 cm
Patent: Yes, peak systolic velocity is 125 cm/s

Right common iliac artery: 1.2 cm

Left common iliac artery: 1.2 cm
IMPRESSION: Abdominal aorta maximum diameter is similar measuring up to 2.7 cm
(previously 2.6 cm). Recommend followup by ultrasound in 5 years.
This recommendation follows ACR consensus guidelines: White Paper of
the ACR Incidental Findings Committee II on Vascular Findings. [HOSPITAL] 9020; [DATE].

## 2022-04-07 ENCOUNTER — Ambulatory Visit (INDEPENDENT_AMBULATORY_CARE_PROVIDER_SITE_OTHER): Payer: Medicare HMO | Admitting: Family Medicine

## 2022-04-07 VITALS — BP 118/68 | HR 69 | Ht 70.0 in | Wt 237.0 lb

## 2022-04-07 DIAGNOSIS — I10 Essential (primary) hypertension: Secondary | ICD-10-CM | POA: Diagnosis not present

## 2022-04-07 DIAGNOSIS — R7303 Prediabetes: Secondary | ICD-10-CM

## 2022-04-07 DIAGNOSIS — E785 Hyperlipidemia, unspecified: Secondary | ICD-10-CM | POA: Diagnosis not present

## 2022-04-07 DIAGNOSIS — F320 Major depressive disorder, single episode, mild: Secondary | ICD-10-CM | POA: Diagnosis not present

## 2022-04-07 DIAGNOSIS — E119 Type 2 diabetes mellitus without complications: Secondary | ICD-10-CM

## 2022-04-07 DIAGNOSIS — Z0001 Encounter for general adult medical examination with abnormal findings: Secondary | ICD-10-CM

## 2022-04-07 DIAGNOSIS — M1A0790 Idiopathic chronic gout, unspecified ankle and foot, without tophus (tophi): Secondary | ICD-10-CM

## 2022-04-07 DIAGNOSIS — Z125 Encounter for screening for malignant neoplasm of prostate: Secondary | ICD-10-CM

## 2022-04-07 DIAGNOSIS — R748 Abnormal levels of other serum enzymes: Secondary | ICD-10-CM

## 2022-04-07 DIAGNOSIS — F325 Major depressive disorder, single episode, in full remission: Secondary | ICD-10-CM

## 2022-04-07 DIAGNOSIS — K760 Fatty (change of) liver, not elsewhere classified: Secondary | ICD-10-CM

## 2022-04-07 DIAGNOSIS — E1169 Type 2 diabetes mellitus with other specified complication: Secondary | ICD-10-CM

## 2022-04-07 MED ORDER — ATORVASTATIN CALCIUM 80 MG PO TABS: 80.0000 mg | ORAL_TABLET | Freq: Every day | ORAL | 3 refills | Status: DC

## 2022-04-07 MED ORDER — METFORMIN HCL 500 MG PO TABS: ORAL_TABLET | ORAL | 1 refills | Status: DC

## 2022-04-07 MED ORDER — GABAPENTIN 300 MG PO CAPS: 300.0000 mg | ORAL_CAPSULE | Freq: Two times a day (BID) | ORAL | 1 refills | Status: DC

## 2022-04-07 MED ORDER — SILDENAFIL CITRATE 100 MG PO TABS: ORAL_TABLET | ORAL | 5 refills | Status: DC

## 2022-04-07 MED ORDER — HYDROCHLOROTHIAZIDE 25 MG PO TABS: 25.0000 mg | ORAL_TABLET | Freq: Every day | ORAL | 1 refills | Status: DC

## 2022-04-07 MED ORDER — LISINOPRIL 10 MG PO TABS: 10.0000 mg | ORAL_TABLET | Freq: Every day | ORAL | 1 refills | Status: DC

## 2022-04-07 MED ORDER — ALLOPURINOL 300 MG PO TABS: 300.0000 mg | ORAL_TABLET | Freq: Every day | ORAL | 1 refills | Status: DC

## 2022-04-07 MED ORDER — SERTRALINE HCL 100 MG PO TABS: 100.0000 mg | ORAL_TABLET | Freq: Every day | ORAL | 1 refills | Status: DC

## 2022-04-07 MED ORDER — POTASSIUM CHLORIDE CRYS ER 20 MEQ PO TBCR: EXTENDED_RELEASE_TABLET | ORAL | 1 refills | Status: DC

## 2022-04-07 NOTE — Progress Notes (Signed)
   Subjective:    Patient ID: Manuel Torres, male    DOB: 04/13/1961, 61 y.o.   MRN: [id number]  HPI The patient comes in today for a wellness visit.    A review of their health history was completed.  A review of medications was also completed.  Any needed refills; yes hctz 90   Eating habits: fair  Falls/  MVA accidents in past few months: no  Regular exercise: walking , [employer]  Specialist pt sees on regular basis:  ortho and spine as needed  Preventative health issues were discussed.   Additional concerns:    Review of Systems     Objective:   Physical Exam General-in no acute distress Eyes-no discharge Lungs-respiratory rate normal, CTA CV-no murmurs,RRR Extremities skin warm dry no edema Neuro grossly normal Behavior normal, alert  Prostate exam normal      Assessment & Plan:   1.  Pression in remission patient states functions well on Zoloft would like to continue - sertraline (ZOLOFT) 100 MG tablet; Take 1 tablet (100 mg total) by mouth daily.  Dispense: 90 tablet; Refill: 1 - Lipid panel - Hepatic function panel - Basic metabolic panel - PSA - Uric acid - Hemoglobin A1c - CBC with Differential/Platelet  2. Hyperlipidemia, unspecified hyperlipidemia type Continue statin watch diet stay active try to lose weight - Lipid panel - Hepatic function panel - Basic metabolic panel - PSA - Uric acid - Hemoglobin A1c - CBC with Differential/Platelet  3. Essential hypertension, benign Blood pressure good control continue current measures refills given - Lipid panel - Hepatic function panel - Basic metabolic panel - PSA - Uric acid - Hemoglobin A1c - CBC with Differential/Platelet  4. Morbid obesity ([place]) Portion control regular physical activity try to lose weight patient has done a good job so far - Lipid panel - Hepatic function panel - Basic metabolic panel - PSA - Uric acid - Hemoglobin A1c - CBC with Differential/Platelet  5.  Screening for prostate cancer Screening PSA - Lipid panel - Hepatic function panel - Basic metabolic panel - PSA - Uric acid - Hemoglobin A1c - CBC with Differential/Platelet  6. Fatty liver History of fatty liver check liver enzymes - Lipid panel - Hepatic function panel - Basic metabolic panel - PSA - Uric acid - Hemoglobin A1c - CBC with Differential/Platelet  7. Prediabetes History of prediabetes at 1 time was diabetic was able to get his A1c down taking the metformin - Lipid panel - Hepatic function panel - Basic metabolic panel - PSA - Uric acid - Hemoglobin A1c - CBC with Differential/Platelet Type 2 diabetes on metformin keeping A1c under good control check A1c Hyperlipidemia with diabetes continue statin keep LDL below 70 check labs  Follow-up in 6 months

## 2022-04-07 NOTE — Patient Instructions (Signed)

## 2022-04-13 DIAGNOSIS — Z125 Encounter for screening for malignant neoplasm of prostate: Secondary | ICD-10-CM | POA: Diagnosis not present

## 2022-04-13 DIAGNOSIS — K760 Fatty (change of) liver, not elsewhere classified: Secondary | ICD-10-CM | POA: Diagnosis not present

## 2022-04-13 DIAGNOSIS — R7303 Prediabetes: Secondary | ICD-10-CM | POA: Diagnosis not present

## 2022-04-13 DIAGNOSIS — I10 Essential (primary) hypertension: Secondary | ICD-10-CM | POA: Diagnosis not present

## 2022-04-13 DIAGNOSIS — E785 Hyperlipidemia, unspecified: Secondary | ICD-10-CM | POA: Diagnosis not present

## 2022-04-13 DIAGNOSIS — F320 Major depressive disorder, single episode, mild: Secondary | ICD-10-CM | POA: Diagnosis not present

## 2022-04-14 LAB — CBC WITH DIFFERENTIAL/PLATELET
Basophils Absolute: 0 10*3/uL (ref 0.0–0.2)
Basos: 1 %
EOS (ABSOLUTE): 0.2 10*3/uL (ref 0.0–0.4)
Eos: 4 %
Hematocrit: 45.7 % (ref 37.5–51.0)
Hemoglobin: 15.7 g/dL (ref 13.0–17.7)
Immature Grans (Abs): 0 10*3/uL (ref 0.0–0.1)
Immature Granulocytes: 0 %
Lymphocytes Absolute: 2 10*3/uL (ref 0.7–3.1)
Lymphs: 43 %
MCH: 33 pg (ref 26.6–33.0)
MCHC: 34.4 g/dL (ref 31.5–35.7)
MCV: 96 fL (ref 79–97)
Monocytes Absolute: 0.5 10*3/uL (ref 0.1–0.9)
Monocytes: 10 %
Neutrophils Absolute: 2 10*3/uL (ref 1.4–7.0)
Neutrophils: 42 %
Platelets: 271 10*3/uL (ref 150–450)
RBC: 4.76 x10E6/uL (ref 4.14–5.80)
RDW: 12.6 % (ref 11.6–15.4)
WBC: 4.8 10*3/uL (ref 3.4–10.8)

## 2022-04-14 LAB — BASIC METABOLIC PANEL
BUN/Creatinine Ratio: 16 (ref 10–24)
BUN: 12 mg/dL (ref 8–27)
CO2: 25 mmol/L (ref 20–29)
Calcium: 9.9 mg/dL (ref 8.6–10.2)
Chloride: 101 mmol/L (ref 96–106)
Creatinine, Ser: 0.76 mg/dL (ref 0.76–1.27)
Glucose: 112 mg/dL — ABNORMAL HIGH (ref 70–99)
Potassium: 4.7 mmol/L (ref 3.5–5.2)
Sodium: 139 mmol/L (ref 134–144)
eGFR: 102 mL/min/{1.73_m2} (ref >59)

## 2022-04-14 LAB — HEPATIC FUNCTION PANEL
ALT: 85 IU/L — ABNORMAL HIGH (ref 0–44)
AST: 76 IU/L — ABNORMAL HIGH (ref 0–40)
Albumin: 4.5 g/dL (ref 3.8–4.8)
Alkaline Phosphatase: 96 IU/L (ref 44–121)
Bilirubin Total: 0.4 mg/dL (ref 0.0–1.2)
Bilirubin, Direct: 0.12 mg/dL (ref 0.00–0.40)
Total Protein: 7.6 g/dL (ref 6.0–8.5)

## 2022-04-14 LAB — LIPID PANEL
Chol/HDL Ratio: 3.7 ratio (ref 0.0–5.0)
Cholesterol, Total: 145 mg/dL (ref 100–199)
HDL: 39 mg/dL — ABNORMAL LOW (ref >39)
LDL Chol Calc (NIH): 91 mg/dL (ref 0–99)
Triglycerides: 78 mg/dL (ref 0–149)
VLDL Cholesterol Cal: 15 mg/dL (ref 5–40)

## 2022-04-14 LAB — HEMOGLOBIN A1C
Est. average glucose Bld gHb Est-mCnc: 105 mg/dL
Hgb A1c MFr Bld: 5.3 % (ref 4.8–5.6)

## 2022-04-14 LAB — PSA: Prostate Specific Ag, Serum: 1.1 ng/mL (ref 0.0–4.0)

## 2022-04-14 LAB — URIC ACID: Uric Acid: 5.5 mg/dL (ref 3.8–8.4)

## 2022-04-29 NOTE — Addendum Note (Signed)
Addended by: Dairl Ponder on: 04/29/2022 02:51 PM   Modules accepted: Orders

## 2022-05-24 DIAGNOSIS — H524 Presbyopia: Secondary | ICD-10-CM | POA: Diagnosis not present

## 2022-05-31 DIAGNOSIS — K760 Fatty (change of) liver, not elsewhere classified: Secondary | ICD-10-CM | POA: Diagnosis not present

## 2022-05-31 DIAGNOSIS — R748 Abnormal levels of other serum enzymes: Secondary | ICD-10-CM | POA: Diagnosis not present

## 2022-06-01 ENCOUNTER — Ambulatory Visit (INDEPENDENT_AMBULATORY_CARE_PROVIDER_SITE_OTHER): Payer: Medicare HMO | Admitting: Family Medicine

## 2022-06-01 VITALS — BP 130/78 | HR 55 | Temp 97.4°F | Ht 70.0 in | Wt 234.0 lb

## 2022-06-01 DIAGNOSIS — K760 Fatty (change of) liver, not elsewhere classified: Secondary | ICD-10-CM

## 2022-06-01 DIAGNOSIS — E669 Obesity, unspecified: Secondary | ICD-10-CM

## 2022-06-01 DIAGNOSIS — Z0100 Encounter for examination of eyes and vision without abnormal findings: Secondary | ICD-10-CM | POA: Diagnosis not present

## 2022-06-01 LAB — HEPATIC FUNCTION PANEL
ALT: 37 IU/L (ref 0–44)
AST: 50 IU/L — ABNORMAL HIGH (ref 0–40)
Albumin: 4.1 g/dL (ref 3.9–4.9)
Alkaline Phosphatase: 88 IU/L (ref 44–121)
Bilirubin Total: 0.4 mg/dL (ref 0.0–1.2)
Bilirubin, Direct: 0.14 mg/dL (ref 0.00–0.40)
Total Protein: 6.6 g/dL (ref 6.0–8.5)

## 2022-06-01 NOTE — Patient Instructions (Signed)

## 2022-06-01 NOTE — Progress Notes (Signed)
   Subjective:    Patient ID: Manuel Torres, male    DOB: May 28, 1961, 61 y.o.   MRN: [id number]  HPI Hepatic lab results for elevated liver enzymes  Patient is trying to eat healthy Trying to stay physically active Works out at the [employer] to eat a healthy diet We did discuss portion control regular physical activity and trying to keep weight down.  Review of Systems     Objective:   Physical Exam  Lungs clear heart regular pulse normal BP good      Assessment & Plan:   1. Fatty liver Enzymes look better.  Patient uses alcohol infrequently.  Patient is trying to eat healthier and stay active and trying to watch portion control  Ultrasound is pending there was a snafu on scheduling-this is being addressed patient was told if he does not hear anything within the next 10 days to notify us  2. Obesity (BMI 30.0-34.9) Portion control regular physical activity trying to lose weight  Patient has follow-up in November

## 2022-06-15 ENCOUNTER — Other Ambulatory Visit: Payer: Self-pay | Admitting: *Deleted

## 2022-06-15 DIAGNOSIS — K760 Fatty (change of) liver, not elsewhere classified: Secondary | ICD-10-CM

## 2022-06-15 DIAGNOSIS — R748 Abnormal levels of other serum enzymes: Secondary | ICD-10-CM

## 2022-07-01 ENCOUNTER — Ambulatory Visit (HOSPITAL_COMMUNITY)
Admission: RE | Admit: 2022-07-01 | Discharge: 2022-07-01 | Disposition: A | Discharge status: Home / Self Care | Payer: Medicare HMO | Source: Ambulatory Visit | Attending: Family Medicine | Admitting: Family Medicine

## 2022-07-01 DIAGNOSIS — R945 Abnormal results of liver function studies: Secondary | ICD-10-CM | POA: Diagnosis not present

## 2022-07-01 DIAGNOSIS — K760 Fatty (change of) liver, not elsewhere classified: Secondary | ICD-10-CM | POA: Insufficient documentation

## 2022-07-08 ENCOUNTER — Encounter (INDEPENDENT_AMBULATORY_CARE_PROVIDER_SITE_OTHER): Payer: Self-pay | Admitting: *Deleted

## 2022-07-08 NOTE — Addendum Note (Signed)
Addended by: Dairl Ponder on: 07/08/2022 10:17 AM   Modules accepted: Orders

## 2022-07-09 ENCOUNTER — Encounter (INDEPENDENT_AMBULATORY_CARE_PROVIDER_SITE_OTHER): Payer: Self-pay | Admitting: *Deleted

## 2022-09-01 ENCOUNTER — Encounter (INDEPENDENT_AMBULATORY_CARE_PROVIDER_SITE_OTHER): Payer: Self-pay | Admitting: Internal Medicine

## 2022-09-22 ENCOUNTER — Telehealth: Payer: Self-pay | Admitting: *Deleted

## 2022-09-22 ENCOUNTER — Encounter: Payer: Self-pay | Admitting: Internal Medicine

## 2022-09-22 ENCOUNTER — Encounter: Payer: Self-pay | Admitting: *Deleted

## 2022-09-22 ENCOUNTER — Ambulatory Visit (INDEPENDENT_AMBULATORY_CARE_PROVIDER_SITE_OTHER): Payer: Medicare HMO | Admitting: Internal Medicine

## 2022-09-22 VITALS — BP 149/83 | HR 55 | Temp 98.2°F | Ht 70.0 in | Wt 231.3 lb

## 2022-09-22 DIAGNOSIS — K760 Fatty (change of) liver, not elsewhere classified: Secondary | ICD-10-CM

## 2022-09-22 DIAGNOSIS — E669 Obesity, unspecified: Secondary | ICD-10-CM

## 2022-09-22 DIAGNOSIS — Z1211 Encounter for screening for malignant neoplasm of colon: Secondary | ICD-10-CM

## 2022-09-22 MED ORDER — PEG 3350-KCL-NA BICARB-NACL 420 G PO SOLR: 4000.0000 mL | Freq: Once | ORAL | 0 refills | Status: AC

## 2022-09-22 NOTE — Progress Notes (Signed)
Primary Care Physician:  Kathyrn Drown, MD Primary Gastroenterologist:  Dr. Abbey Chatters  Chief Complaint  Patient presents with   elevated liver enzymes    New Patient. Referred for elevated liver enzymes.     HPI:   Manuel Torres is a 61 y.o. male who presents to clinic today by referral from his PCP Dr. Wolfgang Phoenix for evaluation for abnormal liver function test.  Patient has had abnormal aminotransferases going back to 2017.  Underwent serological with negative smooth muscle, negative hepatitis C antibody, negative hepatitis B surface antigen, normal iron studies, normal ESR, ANA positive.  Recent blood work 04/13/2022 with AST 76, ALT 85, T. bili 0.4, alk phos 96.  Subsequent blood work 05/31/2022 showed AST 50, ALT 37, T. bili 0.4, alk phos 88.  Ultrasound with elastography 01/01/22 which I personally reviewed Showed normal liver, median K PA of 6.  Risk factors for fatty liver disease include dyslipidemia, prediabetes, obesity.  Very seldom uses alcohol.  No family history of liver disease.  No herbal supplements.  Currently on a statin.  Last colonoscopy 2013 with benign polyps removed.  Recommended 10-year recall.  Patient denies any family history of colorectal malignancy.  No melena hematochezia.  Denies any upper GI symptoms including heartburn, reflux, dysphagia/odynophagia, epigastric or chest pain.  Past Medical History:  Diagnosis Date   Arthritis    Hip   Fatty liver 08/10/2017   Gout    NO RECENT FLARE UPS   Gout    Hypercholesteremia    Hypertension     Past Surgical History:  Procedure Laterality Date   BACK SURGERY     April 22, 2016, laminectomy    COLONOSCOPY  08/03/2012   Procedure: COLONOSCOPY;  Surgeon: Rogene Houston, MD;  Location: [facility];  Service: Endoscopy;  Laterality: N/A;  830   JOINT REPLACEMENT     r hip Dr. Maureen Ralphs Dec 2013   right inguinal hernia     TOTAL HIP ARTHROPLASTY  10/23/2012   Procedure: TOTAL HIP ARTHROPLASTY;  Surgeon:  Gearlean Alf, MD;  Location: [facility];  Service: Orthopedics;  Laterality: Right;   TOTAL HIP ARTHROPLASTY Left 02/02/2017   Procedure: LEFT TOTAL HIP ARTHROPLASTY ANTERIOR APPROACH;  Surgeon: Gaynelle Arabian, MD;  Location: [facility];  Service: Orthopedics;  Laterality: Left;    Current Outpatient Medications  Medication Sig Dispense Refill   allopurinol (ZYLOPRIM) 300 MG tablet Take 1 tablet (300 mg total) by mouth daily. 90 tablet 1   aspirin EC 81 MG tablet Take 81 mg by mouth daily.     atorvastatin (LIPITOR) 80 MG tablet Take 1 tablet (80 mg total) by mouth daily. 90 tablet 3   gabapentin (NEURONTIN) 300 MG capsule Take 1 capsule (300 mg total) by mouth 2 (two) times daily. 180 capsule 1   hydrochlorothiazide (HYDRODIURIL) 25 MG tablet Take 1 tablet (25 mg total) by mouth daily. 90 tablet 1   lisinopril (ZESTRIL) 10 MG tablet Take 1 tablet (10 mg total) by mouth daily. 90 tablet 1   metFORMIN (GLUCOPHAGE) 500 MG tablet TAKE 1/2 TABLET BY MOUTH TWICE A DAY 90 tablet 1   potassium chloride SA (KLOR-CON M20) 20 MEQ tablet TAKE 1 TABLET (20 MEQ TOTAL) BY MOUTH EVERY MORNING. 90 tablet 1   sertraline (ZOLOFT) 100 MG tablet Take 1 tablet (100 mg total) by mouth daily. 90 tablet 1   sildenafil (VIAGRA) 100 MG tablet TAKE 1/2 - 1 TABLET BY MOUTH BEFORE RELATIONS. 10 tablet  5   traZODone (DESYREL) 50 MG tablet TAKE HALF TO 1 TABLET (25-50 MG TOTAL) BY MOUTH AT BEDTIME AS NEEDED FOR SLEEP. 90 tablet 3   vitamin E 180 MG (400 UNITS) capsule Take 400 Units by mouth daily.     oxyCODONE (OXY IR/ROXICODONE) 5 MG immediate release tablet Take 1-3 tablets (5-15 mg total) by mouth every 4 (four) hours as needed for moderate pain or severe pain. (Patient not taking: Reported on 09/22/2022) 120 tablet 0   No current facility-administered medications for this visit.    Allergies as of 09/22/2022 - Review Complete 09/22/2022  Allergen Reaction Noted   Robaxin [methocarbamol]  04/02/2016    Family History   Problem Relation Age of Onset   Colon cancer Other    Diabetes Father    Heart disease Father    Hyperlipidemia Father     Social History   Socioeconomic History   Marital status: Married    Spouse name: Not on file   Number of children: Not on file   Years of education: Not on file   Highest education level: Not on file  Occupational History   Not on file  Tobacco Use   Smoking status: Never    Passive exposure: Current   Smokeless tobacco: Never  Substance and Sexual Activity   Alcohol use: Yes    Comment: occasionally   Drug use: No   Sexual activity: Not on file  Other Topics Concern   Not on file  Social History Narrative   Not on file   Social Determinants of Health   Financial Resource Strain: Not on file  Food Insecurity: Not on file  Transportation Needs: Not on file  Physical Activity: Not on file  Stress: Not on file  Social Connections: Not on file  Intimate Partner Violence: Not on file    Subjective: Review of Systems  Constitutional:  Negative for chills and fever.  HENT:  Negative for congestion and hearing loss.   Eyes:  Negative for blurred vision and double vision.  Respiratory:  Negative for cough and shortness of breath.   Cardiovascular:  Negative for chest pain and palpitations.  Gastrointestinal:  Negative for abdominal pain, blood in stool, constipation, diarrhea, heartburn, melena and vomiting.  Genitourinary:  Negative for dysuria and urgency.  Musculoskeletal:  Negative for joint pain and myalgias.  Skin:  Negative for itching and rash.  Neurological:  Negative for dizziness and headaches.  Psychiatric/Behavioral:  Negative for depression. The patient is not nervous/anxious.        Objective: BP (!) 149/83 (BP Location: Left Arm, Patient Position: Sitting, Cuff Size: Large)   Pulse (!) 55   Temp 98.2 F (36.8 C) (Oral)   Ht 5' 10" (1.778 m)   Wt 231 lb 4.8 oz (104.9 kg)   BMI 33.19 kg/m  Physical Exam Constitutional:       Appearance: Normal appearance.  HENT:     Head: Normocephalic and atraumatic.  Eyes:     Extraocular Movements: Extraocular movements intact.     Conjunctiva/sclera: Conjunctivae normal.  Cardiovascular:     Rate and Rhythm: Normal rate and regular rhythm.  Pulmonary:     Effort: Pulmonary effort is normal.     Breath sounds: Normal breath sounds.  Abdominal:     General: Bowel sounds are normal.     Palpations: Abdomen is soft.  Musculoskeletal:        General: Normal range of motion.     Cervical back: Normal  range of motion and neck supple.  Skin:    General: Skin is warm.  Neurological:     General: No focal deficit present.     Mental Status: He is alert and oriented to person, place, and time.  Psychiatric:        Mood and Affect: Mood normal.        Behavior: Behavior normal.      Assessment: *Abnormal LFTs *Fatty liver disease-FIB-4 1.88 *Obesity *Colon cancer screening  Plan: Patient's mildly abnormal LFTs likely due to steatohepatitis.  Discussed fatty liver in depth with patient today.  Has already lost 40 pounds which is fantastic.  Recommend he continue to exercise daily.  Healthy diet.  Ultrasound with elastography with median K PA of 6.  Continue statin for dyslipidemia.  Metformin for prediabetes.  Limit alcohol use.  Will schedule for screening colonoscopy.The risks including infection, bleed, or perforation as well as benefits, limitations, alternatives and imponderables have been reviewed with the patient. Questions have been answered. All parties agreeable.  Follow-up in 6 months.  Thank you Dr. Wolfgang Phoenix for the kind referral.   09/22/2022 10:27 AM   Disclaimer: This note was dictated with voice recognition software. Similar sounding words can inadvertently be transcribed and may not be corrected upon review.

## 2022-09-22 NOTE — Patient Instructions (Signed)
We will schedule you for colonoscopy for colon cancer screening purposes.  In regards to your abnormal liver test, this is likely due to fatty liver.  We will keep an eye on this.  Continue to work on weight loss, implementing exercise in your daily routine.  Keep high cholesterol under good control.  Continue metformin for your prediabetes.  Low fat/cholesterol diet.   Avoid sweets, sodas, fruit juices, sweetened beverages like tea, etc. Limit alcohol use.  It was very nice meeting you today.  Dr. Abbey Chatters

## 2022-09-22 NOTE — Telephone Encounter (Signed)
PA approved via cohere. Authorization #[id number], DOS: 10/26/2022 - 01/24/2023

## 2022-09-23 ENCOUNTER — Ambulatory Visit (INDEPENDENT_AMBULATORY_CARE_PROVIDER_SITE_OTHER): Payer: Medicare HMO | Admitting: Gastroenterology

## 2022-10-11 ENCOUNTER — Ambulatory Visit: Payer: Medicare HMO | Admitting: Family Medicine

## 2022-10-14 ENCOUNTER — Ambulatory Visit: Payer: Self-pay | Admitting: Family Medicine

## 2022-10-19 ENCOUNTER — Other Ambulatory Visit (HOSPITAL_COMMUNITY)
Admission: RE | Admit: 2022-10-19 | Discharge: 2022-10-19 | Disposition: A | Discharge status: Home / Self Care | Payer: Medicare HMO | Source: Ambulatory Visit | Attending: Internal Medicine | Admitting: Internal Medicine

## 2022-10-19 DIAGNOSIS — Z1211 Encounter for screening for malignant neoplasm of colon: Secondary | ICD-10-CM | POA: Diagnosis not present

## 2022-10-19 LAB — BASIC METABOLIC PANEL
Anion gap: 10 (ref 5–15)
BUN: 15 mg/dL (ref 8–23)
CO2: 25 mmol/L (ref 22–32)
Calcium: 9.2 mg/dL (ref 8.9–10.3)
Chloride: 102 mmol/L (ref 98–111)
Creatinine, Ser: 0.71 mg/dL (ref 0.61–1.24)
GFR, Estimated: >60 mL/min (ref >60)
Glucose, Bld: 111 mg/dL — ABNORMAL HIGH (ref 70–99)
Potassium: 4.2 mmol/L (ref 3.5–5.1)
Sodium: 137 mmol/L (ref 135–145)

## 2022-10-26 ENCOUNTER — Encounter (HOSPITAL_COMMUNITY): Payer: Self-pay

## 2022-10-26 ENCOUNTER — Encounter (HOSPITAL_COMMUNITY)
Admission: RE | Disposition: A | Discharge status: Home / Self Care | Payer: Self-pay | Source: Ambulatory Visit | Attending: Internal Medicine

## 2022-10-26 ENCOUNTER — Ambulatory Visit (HOSPITAL_BASED_OUTPATIENT_CLINIC_OR_DEPARTMENT_OTHER): Payer: Medicare HMO | Admitting: Anesthesiology

## 2022-10-26 ENCOUNTER — Other Ambulatory Visit: Payer: Self-pay

## 2022-10-26 ENCOUNTER — Ambulatory Visit (HOSPITAL_COMMUNITY): Payer: Medicare HMO | Admitting: Anesthesiology

## 2022-10-26 ENCOUNTER — Ambulatory Visit (HOSPITAL_COMMUNITY)
Admission: RE | Admit: 2022-10-26 | Discharge: 2022-10-26 | Disposition: A | Discharge status: Home / Self Care | Payer: Medicare HMO | Source: Ambulatory Visit | Attending: Internal Medicine | Admitting: Internal Medicine

## 2022-10-26 DIAGNOSIS — G4730 Sleep apnea, unspecified: Secondary | ICD-10-CM | POA: Insufficient documentation

## 2022-10-26 DIAGNOSIS — Z79899 Other long term (current) drug therapy: Secondary | ICD-10-CM | POA: Insufficient documentation

## 2022-10-26 DIAGNOSIS — Z6832 Body mass index (BMI) 32.0-32.9, adult: Secondary | ICD-10-CM | POA: Insufficient documentation

## 2022-10-26 DIAGNOSIS — F32A Depression, unspecified: Secondary | ICD-10-CM | POA: Insufficient documentation

## 2022-10-26 DIAGNOSIS — Z1212 Encounter for screening for malignant neoplasm of rectum: Secondary | ICD-10-CM

## 2022-10-26 DIAGNOSIS — I10 Essential (primary) hypertension: Secondary | ICD-10-CM | POA: Diagnosis not present

## 2022-10-26 DIAGNOSIS — K5730 Diverticulosis of large intestine without perforation or abscess without bleeding: Secondary | ICD-10-CM | POA: Diagnosis not present

## 2022-10-26 DIAGNOSIS — Z1211 Encounter for screening for malignant neoplasm of colon: Secondary | ICD-10-CM

## 2022-10-26 DIAGNOSIS — E669 Obesity, unspecified: Secondary | ICD-10-CM | POA: Diagnosis not present

## 2022-10-26 DIAGNOSIS — M1990 Unspecified osteoarthritis, unspecified site: Secondary | ICD-10-CM | POA: Diagnosis not present

## 2022-10-26 HISTORY — PX: COLONOSCOPY WITH PROPOFOL: SHX5780

## 2022-10-26 SURGERY — COLONOSCOPY WITH PROPOFOL
Anesthesia: General

## 2022-10-26 MED ORDER — PROPOFOL 500 MG/50ML IV EMUL
INTRAVENOUS | Status: DC | PRN
  Administered 2022-10-26: 175 ug/kg/min via INTRAVENOUS

## 2022-10-26 MED ORDER — PROPOFOL 10 MG/ML IV BOLUS
INTRAVENOUS | Status: DC | PRN
  Administered 2022-10-26: 50 mg via INTRAVENOUS

## 2022-10-26 MED ORDER — LACTATED RINGERS IV SOLN: INTRAVENOUS | Status: DC | PRN

## 2022-10-26 MED ORDER — LACTATED RINGERS IV SOLN: INTRAVENOUS | Status: DC

## 2022-10-26 NOTE — Op Note (Signed)
[facility] Patient Name: Manuel Torres Procedure Date: 10/26/2022 9:27 AM MRN: [id number] Date of Birth: November 20, 1960 Attending MD: Elon Alas. Abbey Chatters , [place], [id number] CSN: [id number] Age: 61 Admit Type: Outpatient Procedure:                Colonoscopy Indications:              Screening for colorectal malignant neoplasm Providers:                Elon Alas. Abbey Chatters, DO, Hughie Closs RN, RN, Wynonia Musty Tech, Technician Referring MD:              Medicines:                See the Anesthesia note for documentation of the                            administered medications Complications:            No immediate complications. Estimated Blood Loss:     Estimated blood loss: none. Procedure:                Pre-Anesthesia Assessment:                           - The anesthesia plan was to use monitored                            anesthesia care (MAC).                           After obtaining informed consent, the colonoscope                            was passed under direct vision. Throughout the                            procedure, the patient's blood pressure, pulse, and                            oxygen saturations were monitored continuously. The                            [id number]-HQ190L ([id number]) scope was introduced through                            the anus and advanced to the the cecum, identified                            by appendiceal orifice and ileocecal valve. The                            colonoscopy was performed without difficulty. The                            patient tolerated the procedure  well. The quality                            of the bowel preparation was evaluated using the                            BBPS [facility] Bowel Preparation Scale) with scores                            of: Right Colon = 3, Transverse Colon = 3 and Left                            Colon = 3 (entire mucosa seen well with no residual                             staining, small fragments of stool or opaque                            liquid). The total BBPS score equals 9. Scope In: 9:44:45 AM Scope Out: 9:83:38 AM Scope Withdrawal Time: 0 hours 11 minutes 6 seconds  Total Procedure Duration: 0 hours 12 minutes 9 seconds  Findings:      The perianal and digital rectal examinations were normal.      Multiple large-mouthed and small-mouthed diverticula were found in the       sigmoid colon, descending colon and transverse colon.      The exam was otherwise without abnormality. Impression:               - Diverticulosis in the sigmoid colon, in the                            descending colon and in the transverse colon.                           - The examination was otherwise normal.                           - No specimens collected. Moderate Sedation:      Per Anesthesia Care Recommendation:           - Patient has a contact number available for                            emergencies. The signs and symptoms of potential                            delayed complications were discussed with the                            patient. Return to normal activities tomorrow.                            Written discharge instructions were provided to the  patient.                           - Resume previous diet.                           - Continue present medications.                           - Repeat colonoscopy in 10 years for screening                            purposes.                           - Return to GI clinic in 6 months. Procedure Code(s):        --- Professional ---                           Q8250, Colorectal cancer screening; colonoscopy on                            individual not meeting criteria for high risk Diagnosis Code(s):        --- Professional ---                           Z12.11, Encounter for screening for malignant                            neoplasm of colon                           K57.30,  Diverticulosis of large intestine without                            perforation or abscess without bleeding CPT copyright 2022 American Medical Association. All rights reserved. The codes documented in this report are preliminary and upon coder review may  be revised to meet current compliance requirements. Elon Alas. Abbey Chatters, DO [place] Geena Weinhold, DO 10/26/2022 10:01:04 AM This report has been signed electronically. Number of Addenda: 0

## 2022-10-26 NOTE — Transfer of Care (Signed)
Immediate Anesthesia Transfer of Care Note  Patient: Manuel Torres  Procedure(s) Performed: COLONOSCOPY WITH PROPOFOL  Patient Location: PACU  Anesthesia Type:General  Level of Consciousness: awake  Airway & Oxygen Therapy: Patient Spontanous Breathing  Post-op Assessment: Report given to RN and Post -op Vital signs reviewed and stable  Post vital signs: Reviewed and stable  Last Vitals:  Vitals Value Taken Time  BP    Temp    Pulse    Resp    SpO2      Last Pain:  Vitals:   10/26/22 0929  TempSrc:   PainSc: 0-No pain         Complications: No notable events documented.

## 2022-10-26 NOTE — Anesthesia Preprocedure Evaluation (Signed)
Anesthesia Evaluation  Patient identified by MRN, date of birth, ID band Patient awake    Reviewed: Allergy & Precautions, NPO status , Patient's Chart, lab work & pertinent test results  Airway Mallampati: II  TM Distance: >3 FB Neck ROM: Full    Dental no notable dental hx.    Pulmonary neg pulmonary ROS, sleep apnea    Pulmonary exam normal        Cardiovascular Exercise Tolerance: Good hypertension, Pt. on medications Normal cardiovascular exam     Neuro/Psych  PSYCHIATRIC DISORDERS  Depression    negative neurological ROS     GI/Hepatic negative GI ROS, Neg liver ROS,,,  Endo/Other  negative endocrine ROS    Renal/GU negative Renal ROS  negative genitourinary   Musculoskeletal negative musculoskeletal ROS (+) Arthritis , Osteoarthritis,    Abdominal  (+) + obese  Peds  Hematology negative hematology ROS (+)   Anesthesia Other Findings   Reproductive/Obstetrics                             Anesthesia Physical Anesthesia Plan  ASA: 2  Anesthesia Plan: General   Post-op Pain Management:    Induction:   PONV Risk Score and Plan: 1 and TIVA  Airway Management Planned: Nasal Cannula  Additional Equipment:   Intra-op Plan:   Post-operative Plan:   Informed Consent: I have reviewed the patients History and Physical, chart, labs and discussed the procedure including the risks, benefits and alternatives for the proposed anesthesia with the patient or authorized representative who has indicated his/her understanding and acceptance.       Plan Discussed with: CRNA  Anesthesia Plan Comments:         Anesthesia Quick Evaluation

## 2022-10-26 NOTE — H&P (Signed)
Primary Care Physician:  Kathyrn Drown, MD Primary Gastroenterologist:  Dr. Abbey Chatters  Pre-Procedure History & Physical: HPI:  Manuel Torres is a 61 y.o. male is here for a colonoscopy for colon cancer screening purposes.  Patient denies any family history of colorectal cancer.  No melena or hematochezia.  No abdominal pain or unintentional weight loss.  No change in bowel habits.  Overall feels well from a GI standpoint.  Past Medical History:  Diagnosis Date   Arthritis    Hip   Fatty liver 08/10/2017   Gout    NO RECENT FLARE UPS   Gout    Hypercholesteremia    Hypertension     Past Surgical History:  Procedure Laterality Date   BACK SURGERY     April 22, 2016, laminectomy    COLONOSCOPY  08/03/2012   Procedure: COLONOSCOPY;  Surgeon: Rogene Houston, MD;  [facility];  Service: Endoscopy;  Laterality: N/A;  830   JOINT REPLACEMENT     r hip Dr. Maureen Ralphs Dec 2013   right inguinal hernia     TOTAL HIP ARTHROPLASTY  10/23/2012   Procedure: TOTAL HIP ARTHROPLASTY;  Surgeon: Gearlean Alf, MD;  Location: [facility];  Service: Orthopedics;  Laterality: Right;   TOTAL HIP ARTHROPLASTY Left 02/02/2017   Procedure: LEFT TOTAL HIP ARTHROPLASTY ANTERIOR APPROACH;  Surgeon: Gaynelle Arabian, MD;  Location: [facility];  Service: Orthopedics;  Laterality: Left;    Prior to Admission medications   Medication Sig Start Date End Date Taking? Authorizing Provider  allopurinol (ZYLOPRIM) 300 MG tablet Take 1 tablet (300 mg total) by mouth daily. 04/07/22  Yes Kathyrn Drown, MD  aspirin EC 81 MG tablet Take 81 mg by mouth daily.   Yes [provider]  atorvastatin (LIPITOR) 80 MG tablet Take 1 tablet (80 mg total) by mouth daily. 04/07/22  Yes Kathyrn Drown, MD  gabapentin (NEURONTIN) 300 MG capsule Take 1 capsule (300 mg total) by mouth 2 (two) times daily. 04/07/22  Yes Kathyrn Drown, MD  hydrochlorothiazide (HYDRODIURIL) 25 MG tablet Take 1 tablet (25 mg total) by mouth daily.  04/07/22  Yes Kathyrn Drown, MD  ibuprofen (ADVIL) 200 MG tablet Take 400 mg by mouth every 6 (six) hours as needed for moderate pain.   Yes [provider]  latanoprost (XALATAN) 0.005 % ophthalmic solution Place 1 drop into both eyes at bedtime.   Yes [provider]  lisinopril (ZESTRIL) 10 MG tablet Take 1 tablet (10 mg total) by mouth daily. 04/07/22  Yes Kathyrn Drown, MD  metFORMIN (GLUCOPHAGE) 500 MG tablet TAKE 1/2 TABLET BY MOUTH TWICE A DAY 04/07/22  Yes Luking, Scott A, MD  potassium chloride SA (KLOR-CON M20) 20 MEQ tablet TAKE 1 TABLET (20 MEQ TOTAL) BY MOUTH EVERY MORNING. 04/07/22  Yes Luking, Elayne Snare, MD  sildenafil (VIAGRA) 100 MG tablet TAKE 1/2 - 1 TABLET BY MOUTH BEFORE RELATIONS. 04/07/22  Yes Luking, Elayne Snare, MD  vitamin E 180 MG (400 UNITS) capsule Take 400 Units by mouth daily.   Yes [provider]  sertraline (ZOLOFT) 100 MG tablet Take 1 tablet (100 mg total) by mouth daily. Patient not taking: Reported on 10/22/2022 04/07/22   Kathyrn Drown, MD  traZODone (DESYREL) 50 MG tablet TAKE HALF TO 1 TABLET (25-50 MG TOTAL) BY MOUTH AT BEDTIME AS NEEDED FOR SLEEP. Patient not taking: Reported on 10/22/2022 04/22/20   Kathyrn Drown, MD    Allergies  as of 09/22/2022 - Review Complete 09/22/2022  Allergen Reaction Noted   Robaxin [methocarbamol]  04/02/2016    Family History  Problem Relation Age of Onset   Colon cancer Other    Diabetes Father    Heart disease Father    Hyperlipidemia Father     Social History   Socioeconomic History   Marital status: Married    Spouse name: Not on file   Number of children: Not on file   Years of education: Not on file   Highest education level: Not on file  Occupational History   Not on file  Tobacco Use   Smoking status: Never    Passive exposure: Current   Smokeless tobacco: Never  Substance and Sexual Activity   Alcohol use: Yes    Comment: occasionally   Drug use: No   Sexual activity: Not  on file  Other Topics Concern   Not on file  Social History Narrative   Not on file   Social Determinants of Health   Financial Resource Strain: Not on file  Food Insecurity: Not on file  Transportation Needs: Not on file  Physical Activity: Not on file  Stress: Not on file  Social Connections: Not on file  Intimate Partner Violence: Not on file    Review of Systems: See HPI, otherwise negative ROS  Physical Exam: Vital signs in last 24 hours: Temp:  [97.8 F (36.6 C)] 97.8 F (36.6 C) (12/12 0915) Pulse Rate:  [50] 50 (12/12 0915) Resp:  [13] 13 (12/12 0915) BP: (159)/(78) 159/78 (12/12 0915) SpO2:  [100 %] 100 % (12/12 0915) Weight:  [106.6 kg] 106.6 kg (12/12 0915)   General:   Alert,  Well-developed, well-nourished, pleasant and cooperative in NAD Head:  Normocephalic and atraumatic. Eyes:  Sclera clear, no icterus.   Conjunctiva pink. Ears:  Normal auditory acuity. Nose:  No deformity, discharge,  or lesions. Msk:  Symmetrical without gross deformities. Normal posture. Extremities:  Without clubbing or edema. Neurologic:  Alert and  oriented x4;  grossly normal neurologically. Skin:  Intact without significant lesions or rashes. Psych:  Alert and cooperative. Normal mood and affect.  Impression/Plan: Manuel Torres is here for a colonoscopy to be performed for colon cancer screening purposes.  The risks of the procedure including infection, bleed, or perforation as well as benefits, limitations, alternatives and imponderables have been reviewed with the patient. Questions have been answered. All parties agreeable.

## 2022-10-26 NOTE — Anesthesia Postprocedure Evaluation (Signed)
Anesthesia Post Note  Patient: PAYDEN DOCTER  Procedure(s) Performed: COLONOSCOPY WITH PROPOFOL  Patient location during evaluation: Endoscopy Anesthesia Type: General Level of consciousness: awake and alert Pain management: pain level controlled Vital Signs Assessment: post-procedure vital signs reviewed and stable Respiratory status: spontaneous breathing, nonlabored ventilation, respiratory function stable and patient connected to nasal cannula oxygen Cardiovascular status: blood pressure returned to baseline and stable Postop Assessment: no apparent nausea or vomiting Anesthetic complications: no   There were no known notable events for this encounter.   Last Vitals:  Vitals:   10/26/22 0958 10/26/22 1005  BP:  115/69  Pulse: 65 62  Resp: 12 16  Temp: 36.5 C   SpO2: 99% 99%    Last Pain:  Vitals:   10/26/22 1007  TempSrc:   PainSc: 0-No pain                 Trixie Rude

## 2022-10-26 NOTE — Discharge Instructions (Addendum)
  Colonoscopy Discharge Instructions  Read the instructions outlined below and refer to this sheet in the next few weeks. These discharge instructions provide you with general information on caring for yourself after you leave the hospital. Your doctor may also give you specific instructions. While your treatment has been planned according to the most current medical practices available, unavoidable complications occasionally occur.   ACTIVITY You may resume your regular activity, but move at a slower pace for the next 24 hours.  Take frequent rest periods for the next 24 hours.  Walking will help get rid of the air and reduce the bloated feeling in your belly (abdomen).  No driving for 24 hours (because of the medicine (anesthesia) used during the test).   Do not sign any important legal documents or operate any machinery for 24 hours (because of the anesthesia used during the test).  NUTRITION Drink plenty of fluids.  You may resume your normal diet as instructed by your doctor.  Begin with a light meal and progress to your normal diet. Heavy or fried foods are harder to digest and may make you feel sick to your stomach (nauseated).  Avoid alcoholic beverages for 24 hours or as instructed.  MEDICATIONS You may resume your normal medications unless your doctor tells you otherwise.  WHAT YOU CAN EXPECT TODAY Some feelings of bloating in the abdomen.  Passage of more gas than usual.  Spotting of blood in your stool or on the toilet paper.  IF YOU HAD POLYPS REMOVED DURING THE COLONOSCOPY: No aspirin products for 7 days or as instructed.  No alcohol for 7 days or as instructed.  Eat a soft diet for the next 24 hours.  FINDING OUT THE RESULTS OF YOUR TEST Not all test results are available during your visit. If your test results are not back during the visit, make an appointment with your caregiver to find out the results. Do not assume everything is normal if you have not heard from your  caregiver or the medical facility. It is important for you to follow up on all of your test results.  SEEK IMMEDIATE MEDICAL ATTENTION IF: You have more than a spotting of blood in your stool.  Your belly is swollen (abdominal distention).  You are nauseated or vomiting.  You have a temperature over 101.  You have abdominal pain or discomfort that is severe or gets worse throughout the day.   Your colonoscopy was relatively unremarkable.  I did not find any polyps or evidence of colon cancer.  I recommend repeating colonoscopy in 10 years for colon cancer screening purposes.  You do have diverticulosis. I would recommend increasing fiber in your diet or adding OTC Benefiber/Metamucil. Be sure to drink at least 4 to 6 glasses of water daily. Follow-up with GI in 6 months.   I hope you have a great rest of your week!  Elon Alas. Abbey Chatters, D.O. Gastroenterology and Hepatology [facility]

## 2022-11-02 ENCOUNTER — Encounter (HOSPITAL_COMMUNITY): Payer: Self-pay | Admitting: Internal Medicine

## 2022-11-10 ENCOUNTER — Ambulatory Visit (INDEPENDENT_AMBULATORY_CARE_PROVIDER_SITE_OTHER): Payer: Medicare HMO | Admitting: Family Medicine

## 2022-11-10 ENCOUNTER — Encounter: Payer: Self-pay | Admitting: Family Medicine

## 2022-11-10 VITALS — BP 138/82 | HR 55 | Temp 98.1°F | Wt 238.6 lb

## 2022-11-10 DIAGNOSIS — F325 Major depressive disorder, single episode, in full remission: Secondary | ICD-10-CM | POA: Insufficient documentation

## 2022-11-10 DIAGNOSIS — I10 Essential (primary) hypertension: Secondary | ICD-10-CM | POA: Diagnosis not present

## 2022-11-10 DIAGNOSIS — R7303 Prediabetes: Secondary | ICD-10-CM | POA: Diagnosis not present

## 2022-11-10 DIAGNOSIS — Z23 Encounter for immunization: Secondary | ICD-10-CM | POA: Diagnosis not present

## 2022-11-10 DIAGNOSIS — E785 Hyperlipidemia, unspecified: Secondary | ICD-10-CM

## 2022-11-10 MED ORDER — POTASSIUM CHLORIDE CRYS ER 20 MEQ PO TBCR: EXTENDED_RELEASE_TABLET | ORAL | 1 refills | Status: DC

## 2022-11-10 MED ORDER — GABAPENTIN 300 MG PO CAPS: 300.0000 mg | ORAL_CAPSULE | Freq: Two times a day (BID) | ORAL | 1 refills | Status: DC

## 2022-11-10 MED ORDER — ALLOPURINOL 300 MG PO TABS: 300.0000 mg | ORAL_TABLET | Freq: Every day | ORAL | 1 refills | Status: DC

## 2022-11-10 MED ORDER — LISINOPRIL 10 MG PO TABS: 10.0000 mg | ORAL_TABLET | Freq: Every day | ORAL | 1 refills | Status: DC

## 2022-11-10 MED ORDER — METFORMIN HCL 500 MG PO TABS: ORAL_TABLET | ORAL | 1 refills | Status: DC

## 2022-11-10 MED ORDER — ATORVASTATIN CALCIUM 80 MG PO TABS: 80.0000 mg | ORAL_TABLET | Freq: Every day | ORAL | 3 refills | Status: DC

## 2022-11-10 MED ORDER — HYDROCHLOROTHIAZIDE 25 MG PO TABS: 25.0000 mg | ORAL_TABLET | Freq: Every day | ORAL | 1 refills | Status: DC

## 2022-11-10 MED ORDER — SILDENAFIL CITRATE 100 MG PO TABS: ORAL_TABLET | ORAL | 5 refills | Status: DC

## 2022-11-10 NOTE — Progress Notes (Signed)
   Subjective:    Patient ID: Manuel Torres, male    DOB: 07-31-1961, 61 y.o.   MRN: [id number]  HPI Patient arrives today for 6 mouth follow up and medication. Needs flu shot - Plan: Flu Vaccine QUAD 6+ mos PF IM (Fluarix Quad PF)  Patient states he needs refills no major concerns today.    Review of Systems     Objective:   Physical Exam  General-in no acute distress Eyes-no discharge Lungs-respiratory rate normal, CTA CV-no murmurs,RRR Extremities skin warm dry no edema Neuro grossly normal Behavior normal, alert       Assessment & Plan:   1. Needs flu shot Flu shot today - Flu Vaccine QUAD 6+ mos PF IM (Fluarix Quad PF)  2. Essential hypertension, benign Blood pressure not at goal very important patient to increase aerobic activity follow-up in 4 weeks to recheck blood pressure continue medications watch diet closely try to lose weight  3. Hyperlipidemia, unspecified hyperlipidemia type Hyperlipidemia-importance of diet, weight control, activity, compliance with medications discussed.   Recent labs reviewed.   Any additional labs or refills ordered.   Importance of keeping under good control discussed. Regular follow-up visits discussed   4. Prediabetes Prediabetes A1c reasonable watch diet stay active  5. Morbid obesity ([place]) Portion control regular physical activity try to bring weight down  6. Depression, major, in remission ([place]) Under remission currently patient no longer using Zoloft on a regular basis after shared discussion he would like to stop the medicine if he notices any problems or drawbacks he will follow-up immediately

## 2022-11-23 DIAGNOSIS — H401221 Low-tension glaucoma, left eye, mild stage: Secondary | ICD-10-CM | POA: Diagnosis not present

## 2022-11-23 LAB — HM DIABETES EYE EXAM

## 2022-12-08 ENCOUNTER — Encounter: Payer: Self-pay | Admitting: Family Medicine

## 2022-12-08 ENCOUNTER — Ambulatory Visit (INDEPENDENT_AMBULATORY_CARE_PROVIDER_SITE_OTHER): Payer: Medicare HMO | Admitting: Family Medicine

## 2022-12-08 VITALS — BP 137/76 | HR 67 | Temp 97.9°F | Ht 71.0 in | Wt 236.0 lb

## 2022-12-08 DIAGNOSIS — M1A0790 Idiopathic chronic gout, unspecified ankle and foot, without tophus (tophi): Secondary | ICD-10-CM

## 2022-12-08 DIAGNOSIS — I10 Essential (primary) hypertension: Secondary | ICD-10-CM | POA: Diagnosis not present

## 2022-12-08 DIAGNOSIS — R7303 Prediabetes: Secondary | ICD-10-CM | POA: Diagnosis not present

## 2022-12-08 DIAGNOSIS — E1169 Type 2 diabetes mellitus with other specified complication: Secondary | ICD-10-CM

## 2022-12-08 DIAGNOSIS — E785 Hyperlipidemia, unspecified: Secondary | ICD-10-CM

## 2022-12-08 DIAGNOSIS — F325 Major depressive disorder, single episode, in full remission: Secondary | ICD-10-CM

## 2022-12-08 NOTE — Progress Notes (Signed)
   Subjective:    Patient ID: Manuel Torres, male    DOB: 1961/10/27, 62 y.o.   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 Essential hypertension, benign Blood pressure good control continue current measures - Microalbumin/Creatinine Ratio, Urine - Lipid Panel - Basic Metabolic Panel - Hepatic Function Panel - Uric Acid - Hemoglobin A1c  2. Hyperlipidemia, unspecified hyperlipidemia type Lab work near future healthy diet continue statin look at A1c - Lipid Panel  3. Prediabetes Lipid A1c - Microalbumin/Creatinine Ratio, Urine - Lipid Panel - Basic Metabolic Panel - Hepatic Function Panel - Uric Acid - Hemoglobin A1c  4. Idiopathic chronic gout of foot without tophus, unspecified laterality Uric acid - Uric Acid  5. Depression, major, in remission [facility]) Moods are doing good   7. Hyperlipidemia associated with type 2 diabetes mellitus  ([place]) Healthy diet regular activity continue statin check A1c  Follow-up in 6 months

## 2022-12-15 ENCOUNTER — Encounter: Payer: Self-pay | Admitting: *Deleted

## 2022-12-20 DIAGNOSIS — I10 Essential (primary) hypertension: Secondary | ICD-10-CM | POA: Diagnosis not present

## 2022-12-20 DIAGNOSIS — M1A0790 Idiopathic chronic gout, unspecified ankle and foot, without tophus (tophi): Secondary | ICD-10-CM | POA: Diagnosis not present

## 2022-12-20 DIAGNOSIS — E785 Hyperlipidemia, unspecified: Secondary | ICD-10-CM | POA: Diagnosis not present

## 2022-12-20 DIAGNOSIS — R7303 Prediabetes: Secondary | ICD-10-CM | POA: Diagnosis not present

## 2022-12-23 LAB — BASIC METABOLIC PANEL
BUN/Creatinine Ratio: 10 (ref 10–24)
BUN: 8 mg/dL (ref 8–27)
CO2: 25 mmol/L (ref 20–29)
Calcium: 9.4 mg/dL (ref 8.6–10.2)
Chloride: 103 mmol/L (ref 96–106)
Creatinine, Ser: 0.8 mg/dL (ref 0.76–1.27)
Glucose: 113 mg/dL — ABNORMAL HIGH (ref 70–99)
Potassium: 4.7 mmol/L (ref 3.5–5.2)
Sodium: 140 mmol/L (ref 134–144)
eGFR: 100 mL/min/{1.73_m2} (ref >59)

## 2022-12-23 LAB — LIPID PANEL
Chol/HDL Ratio: 3.1 ratio (ref 0.0–5.0)
Cholesterol, Total: 124 mg/dL (ref 100–199)
HDL: 40 mg/dL (ref >39)
LDL Chol Calc (NIH): 69 mg/dL (ref 0–99)
Triglycerides: 72 mg/dL (ref 0–149)
VLDL Cholesterol Cal: 15 mg/dL (ref 5–40)

## 2022-12-23 LAB — URIC ACID: Uric Acid: 4.4 mg/dL (ref 3.8–8.4)

## 2022-12-23 LAB — MICROALBUMIN / CREATININE URINE RATIO
Creatinine, Urine: 195 mg/dL
Microalb/Creat Ratio: 3 mg/g creat (ref 0–29)
Microalbumin, Urine: 6 ug/mL

## 2022-12-23 LAB — HEPATIC FUNCTION PANEL
ALT: 69 IU/L — ABNORMAL HIGH (ref 0–44)
AST: 68 IU/L — ABNORMAL HIGH (ref 0–40)
Albumin: 4.2 g/dL (ref 3.9–4.9)
Alkaline Phosphatase: 92 IU/L (ref 44–121)
Bilirubin Total: 0.3 mg/dL (ref 0.0–1.2)
Bilirubin, Direct: 0.14 mg/dL (ref 0.00–0.40)
Total Protein: 7 g/dL (ref 6.0–8.5)

## 2022-12-23 LAB — HEMOGLOBIN A1C
Est. average glucose Bld gHb Est-mCnc: 114 mg/dL
Hgb A1c MFr Bld: 5.6 % (ref 4.8–5.6)

## 2023-01-02 ENCOUNTER — Encounter: Payer: Self-pay | Admitting: Family Medicine

## 2023-01-02 NOTE — Progress Notes (Signed)
Please mail to the patient because he does not always use MyChart

## 2023-02-02 ENCOUNTER — Ambulatory Visit (HOSPITAL_COMMUNITY)
Admission: RE | Admit: 2023-02-02 | Discharge: 2023-02-02 | Disposition: A | Discharge status: Home / Self Care | Payer: Medicare HMO | Source: Ambulatory Visit | Attending: Family Medicine | Admitting: Family Medicine

## 2023-02-02 ENCOUNTER — Ambulatory Visit (INDEPENDENT_AMBULATORY_CARE_PROVIDER_SITE_OTHER): Payer: Medicare HMO | Admitting: Family Medicine

## 2023-02-02 VITALS — BP 120/72 | Ht 71.0 in | Wt 237.0 lb

## 2023-02-02 DIAGNOSIS — M25552 Pain in left hip: Secondary | ICD-10-CM | POA: Diagnosis not present

## 2023-02-02 DIAGNOSIS — M5432 Sciatica, left side: Secondary | ICD-10-CM

## 2023-02-02 DIAGNOSIS — R102 Pelvic and perineal pain: Secondary | ICD-10-CM | POA: Diagnosis not present

## 2023-02-02 MED ORDER — DICLOFENAC SODIUM 75 MG PO TBEC: 75.0000 mg | DELAYED_RELEASE_TABLET | Freq: Two times a day (BID) | ORAL | 0 refills | Status: AC

## 2023-02-02 NOTE — Progress Notes (Signed)
   Subjective:    Patient ID: BARTLETT MUHLENKAMP, male    DOB: 1961/06/18, 62 y.o.   MRN: [id number]  HPI Patient arrives with left hip pain for several weeks Patient relates pain in the left hip when questioned more closely the pain is more in the left buttock region radiates down to about the trochanter level but not down the leg He states certain movements make it hurt worse Denies any weakness in the legs   Review of Systems     Objective:   Physical Exam Lungs clear heart regular abdomen is soft hips have very tight hamstrings with restricted range of motion left side versus right side       Assessment & Plan:  I believe that this is more likely sciatica-stretching exercises.  Tylenol as needed.  If progressive troubles or frequent troubles over the next several weeks may need Lyrica may need additional testing. Recommend imaging of the hip because of previous hip surgery bilateral as well as a pelvis x-ray.

## 2023-02-09 ENCOUNTER — Other Ambulatory Visit: Payer: Self-pay

## 2023-02-14 ENCOUNTER — Other Ambulatory Visit: Payer: Self-pay | Admitting: *Deleted

## 2023-02-14 DIAGNOSIS — E785 Hyperlipidemia, unspecified: Secondary | ICD-10-CM

## 2023-02-14 DIAGNOSIS — R7303 Prediabetes: Secondary | ICD-10-CM

## 2023-02-14 DIAGNOSIS — Z125 Encounter for screening for malignant neoplasm of prostate: Secondary | ICD-10-CM

## 2023-02-14 DIAGNOSIS — E119 Type 2 diabetes mellitus without complications: Secondary | ICD-10-CM

## 2023-02-14 DIAGNOSIS — M109 Gout, unspecified: Secondary | ICD-10-CM

## 2023-02-14 DIAGNOSIS — K760 Fatty (change of) liver, not elsewhere classified: Secondary | ICD-10-CM

## 2023-03-23 ENCOUNTER — Encounter: Payer: Self-pay | Admitting: Internal Medicine

## 2023-03-23 ENCOUNTER — Ambulatory Visit (INDEPENDENT_AMBULATORY_CARE_PROVIDER_SITE_OTHER): Payer: Medicare HMO | Admitting: Internal Medicine

## 2023-03-23 VITALS — BP 122/78 | HR 82 | Temp 98.1°F | Ht 71.0 in | Wt 235.0 lb

## 2023-03-23 DIAGNOSIS — K760 Fatty (change of) liver, not elsewhere classified: Secondary | ICD-10-CM | POA: Diagnosis not present

## 2023-03-23 DIAGNOSIS — R7989 Other specified abnormal findings of blood chemistry: Secondary | ICD-10-CM

## 2023-03-23 DIAGNOSIS — E785 Hyperlipidemia, unspecified: Secondary | ICD-10-CM | POA: Diagnosis not present

## 2023-03-23 DIAGNOSIS — M109 Gout, unspecified: Secondary | ICD-10-CM | POA: Diagnosis not present

## 2023-03-23 DIAGNOSIS — K7581 Nonalcoholic steatohepatitis (NASH): Secondary | ICD-10-CM

## 2023-03-23 DIAGNOSIS — E669 Obesity, unspecified: Secondary | ICD-10-CM

## 2023-03-23 DIAGNOSIS — Z125 Encounter for screening for malignant neoplasm of prostate: Secondary | ICD-10-CM | POA: Diagnosis not present

## 2023-03-23 DIAGNOSIS — E119 Type 2 diabetes mellitus without complications: Secondary | ICD-10-CM | POA: Diagnosis not present

## 2023-03-23 DIAGNOSIS — R7401 Elevation of levels of liver transaminase levels: Secondary | ICD-10-CM | POA: Diagnosis not present

## 2023-03-23 NOTE — Patient Instructions (Signed)
Given your recent increase in liver numbers, I am going to perform serological workup today at [facility] to further evaluate.  We will call with results.  Recommend 1-2# weight loss per week until ideal body weight through exercise & diet. Low fat/cholesterol diet.   Avoid sweets, sodas, fruit juices, sweetened beverages like tea, etc. Gradually increase exercise from 15 min daily up to 1 hr per day 5 days/week. Limit alcohol use.  Follow up in 6 months or sooner if needed  It was nice seeing you again today.  Dr. Marletta Lor

## 2023-03-23 NOTE — Progress Notes (Signed)
Primary Care Physician:  Babs Sciara, MD Primary Gastroenterologist:  Dr. Marletta Lor  Chief Complaint  Patient presents with   fatty liver    Follow up on fatty liver. Doing portion control with diet and exercising more.     HPI:   Manuel Torres is a 62 y.o. male who presents to clinic today by referral from his PCP Dr. Gerda Diss for evaluation for abnormal liver function test.  Patient has had abnormal aminotransferases going back to 2017.  Underwent serological workup at the time with negative smooth muscle, negative hepatitis C antibody, negative hepatitis B surface antigen, normal iron studies, normal ESR, ANA positive.  Recent blood work 12/20/2022 with AST 68, ALT 69, T. bili 0.3, alk phos 92.  Slightly increased aminotransferases compared to July 2023.  Ultrasound with elastography 01/01/22 which I personally reviewed Showed normal liver, median KPA of 6.  Risk factors for fatty liver disease include dyslipidemia, prediabetes, obesity.  Very seldom uses alcohol.  No family history of liver disease.  No herbal supplements.  Currently on a statin.  Colonoscopy 10/26/2022 unremarkable.  Denies any upper GI symptoms including heartburn, reflux, dysphagia/odynophagia, epigastric or chest pain.  Past Medical History:  Diagnosis Date   Arthritis    Hip   Fatty liver 08/10/2017   Gout    NO RECENT FLARE UPS   Gout    Hypercholesteremia    Hypertension     Past Surgical History:  Procedure Laterality Date   BACK SURGERY     April 22, 2016, laminectomy    COLONOSCOPY  08/03/2012   Procedure: COLONOSCOPY;  Surgeon: Malissa Hippo, MD;  [facility];  Service: Endoscopy;  Laterality: N/A;  830   COLONOSCOPY WITH PROPOFOL N/A 10/26/2022   Procedure: COLONOSCOPY WITH PROPOFOL;  Surgeon: Lanelle Bal, DO;  [facility];  Service: Endoscopy;  Laterality: N/A;  10:30am, asa 2   JOINT REPLACEMENT     r hip Dr. Despina Hick Dec 2013   right inguinal hernia      TOTAL HIP ARTHROPLASTY  10/23/2012   Procedure: TOTAL HIP ARTHROPLASTY;  Surgeon: Loanne Drilling, MD;  Location: [facility];  Service: Orthopedics;  Laterality: Right;   TOTAL HIP ARTHROPLASTY Left 02/02/2017   Procedure: LEFT TOTAL HIP ARTHROPLASTY ANTERIOR APPROACH;  Surgeon: Ollen Gross, MD;  Location: [facility];  Service: Orthopedics;  Laterality: Left;    Current Outpatient Medications  Medication Sig Dispense Refill   allopurinol (ZYLOPRIM) 300 MG tablet Take 1 tablet (300 mg total) by mouth daily. 90 tablet 1   atorvastatin (LIPITOR) 80 MG tablet Take 1 tablet (80 mg total) by mouth daily. 90 tablet 3   diclofenac (VOLTAREN) 75 MG EC tablet Take 1 tablet (75 mg total) by mouth 2 (two) times daily. 30 tablet 0   gabapentin (NEURONTIN) 300 MG capsule Take 1 capsule (300 mg total) by mouth 2 (two) times daily. 180 capsule 1   hydrochlorothiazide (HYDRODIURIL) 25 MG tablet Take 1 tablet (25 mg total) by mouth daily. 90 tablet 1   latanoprost (XALATAN) 0.005 % ophthalmic solution Place 1 drop into both eyes at bedtime.     lisinopril (ZESTRIL) 10 MG tablet Take 1 tablet (10 mg total) by mouth daily. 90 tablet 1   metFORMIN (GLUCOPHAGE) 500 MG tablet TAKE 1/2 TABLET BY MOUTH TWICE A DAY 90 tablet 1   potassium chloride SA (KLOR-CON M20) 20 MEQ tablet TAKE 1 TABLET (20 MEQ TOTAL) BY MOUTH EVERY MORNING. 90  tablet 1   sildenafil (VIAGRA) 100 MG tablet TAKE 1/2 - 1 TABLET BY MOUTH BEFORE RELATIONS. 20 tablet 5   vitamin E 180 MG (400 UNITS) capsule Take 400 Units by mouth daily.     No current facility-administered medications for this visit.    Allergies as of 03/23/2023 - Review Complete 03/23/2023  Allergen Reaction Noted   Robaxin [methocarbamol]  04/02/2016    Family History  Problem Relation Age of Onset   Colon cancer Other    Diabetes Father    Heart disease Father    Hyperlipidemia Father     Social History   Socioeconomic History   Marital status: Married    Spouse name: Not  on file   Number of children: Not on file   Years of education: Not on file   Highest education level: Not on file  Occupational History   Not on file  Tobacco Use   Smoking status: Never    Passive exposure: Current   Smokeless tobacco: Never  Substance and Sexual Activity   Alcohol use: Yes    Comment: occasionally   Drug use: No   Sexual activity: Not on file  Other Topics Concern   Not on file  Social History Narrative   Not on file   Social Determinants of Health   Financial Resource Strain: Not on file  Food Insecurity: Not on file  Transportation Needs: Not on file  Physical Activity: Not on file  Stress: Not on file  Social Connections: Not on file  Intimate Partner Violence: Not on file    Subjective: Review of Systems  Constitutional:  Negative for chills and fever.  HENT:  Negative for congestion and hearing loss.   Eyes:  Negative for blurred vision and double vision.  Respiratory:  Negative for cough and shortness of breath.   Cardiovascular:  Negative for chest pain and palpitations.  Gastrointestinal:  Negative for abdominal pain, blood in stool, constipation, diarrhea, heartburn, melena and vomiting.  Genitourinary:  Negative for dysuria and urgency.  Musculoskeletal:  Negative for joint pain and myalgias.  Skin:  Negative for itching and rash.  Neurological:  Negative for dizziness and headaches.  Psychiatric/Behavioral:  Negative for depression. The patient is not nervous/anxious.        Objective: BP 122/78 (BP Location: Left Arm, Patient Position: Sitting, Cuff Size: Large)   Pulse 82   Temp 98.1 F (36.7 C) (Oral)   Ht 5\' 11"  (1.803 m)   Wt 235 lb (106.6 kg)   BMI 32.78 kg/m  Physical Exam Constitutional:      Appearance: Normal appearance.  HENT:     Head: Normocephalic and atraumatic.  Eyes:     Extraocular Movements: Extraocular movements intact.     Conjunctiva/sclera: Conjunctivae normal.  Cardiovascular:     Rate and Rhythm:  Normal rate and regular rhythm.  Pulmonary:     Effort: Pulmonary effort is normal.     Breath sounds: Normal breath sounds.  Abdominal:     General: Bowel sounds are normal.     Palpations: Abdomen is soft.  Musculoskeletal:        General: Normal range of motion.     Cervical back: Normal range of motion and neck supple.  Skin:    General: Skin is warm.  Neurological:     General: No focal deficit present.     Mental Status: He is alert and oriented to person, place, and time.  Psychiatric:  Mood and Affect: Mood normal.        Behavior: Behavior normal.      Assessment: *Abnormal LFTs *Steatohepatitis-FIB-4 1.88 *Obesity *Colon cancer screening  Plan: Patient's mildly abnormal LFTs likely due to steatohepatitis.  Discussed fatty liver in depth with patient today.  Has already lost 40 pounds which is fantastic.  Recommend he continue to exercise daily.  Healthy diet.  Ultrasound with elastography with median K PA of 6.  Continue statin for dyslipidemia.  Metformin for prediabetes.  Limit alcohol use.  Given recent rise in aminotransferases, will further workup serologically today.  Call with results.  Follow-up in 6 months.     03/23/2023 1:41 PM   Disclaimer: This note was dictated with voice recognition software. Similar sounding words can inadvertently be transcribed and may not be corrected upon review.

## 2023-03-24 LAB — HEPATITIS B SURFACE ANTIGEN: Hepatitis B Surface Ag: NEGATIVE

## 2023-03-24 LAB — MICROALBUMIN / CREATININE URINE RATIO
Creatinine, Urine: 231.7 mg/dL
Microalb/Creat Ratio: 2 mg/g creat (ref 0–29)
Microalbumin, Urine: 5.2 ug/mL

## 2023-03-24 LAB — URIC ACID: Uric Acid: 5.4 mg/dL (ref 3.8–8.4)

## 2023-03-24 LAB — LIPID PANEL
Chol/HDL Ratio: 3.6 ratio (ref 0.0–5.0)
Cholesterol, Total: 156 mg/dL (ref 100–199)
HDL: 43 mg/dL (ref >39)
LDL Chol Calc (NIH): 98 mg/dL (ref 0–99)
Triglycerides: 79 mg/dL (ref 0–149)
VLDL Cholesterol Cal: 15 mg/dL (ref 5–40)

## 2023-03-24 LAB — HEMOGLOBIN A1C
Est. average glucose Bld gHb Est-mCnc: 114 mg/dL
Hgb A1c MFr Bld: 5.6 % (ref 4.8–5.6)

## 2023-03-24 LAB — PSA: Prostate Specific Ag, Serum: 1.2 ng/mL (ref 0.0–4.0)

## 2023-03-24 LAB — ANTI-SMOOTH MUSCLE ANTIBODY, IGG: Smooth Muscle Ab: 14 Units (ref 0–19)

## 2023-03-24 LAB — FERRITIN: Ferritin: 257 ng/mL (ref 30–400)

## 2023-03-28 ENCOUNTER — Encounter: Payer: Self-pay | Admitting: Family Medicine

## 2023-03-28 NOTE — Progress Notes (Signed)
Please mail to the patient 

## 2023-03-29 LAB — ACUTE VIRAL HEPATITIS (HAV, HBV, HCV)
HCV Ab: NONREACTIVE
Hep A IgM: NEGATIVE
Hep B C IgM: NEGATIVE
Hepatitis B Surface Ag: NEGATIVE

## 2023-03-29 LAB — IMMUNOGLOBULINS A/E/G/M, SERUM
IgA/Immunoglobulin A, Serum: 264 mg/dL (ref 61–437)
IgE (Immunoglobulin E), Serum: 1548 IU/mL — ABNORMAL HIGH (ref 6–495)
IgG (Immunoglobin G), Serum: 1546 mg/dL (ref 603–1613)
IgM (Immunoglobulin M), Srm: 97 mg/dL (ref 20–172)

## 2023-03-29 LAB — HCV INTERPRETATION

## 2023-03-29 LAB — IRON,TIBC AND FERRITIN PANEL
Ferritin: 266 ng/mL (ref 30–400)
Iron Saturation: 34 % (ref 15–55)
Iron: 95 ug/dL (ref 38–169)
Total Iron Binding Capacity: 281 ug/dL (ref 250–450)
UIBC: 186 ug/dL (ref 111–343)

## 2023-03-29 LAB — HEPATIC FUNCTION PANEL
ALT: 56 IU/L — ABNORMAL HIGH (ref 0–44)
AST: 70 IU/L — ABNORMAL HIGH (ref 0–40)
Albumin: 4.3 g/dL (ref 3.9–4.9)
Alkaline Phosphatase: 92 IU/L (ref 44–121)
Bilirubin Total: 0.9 mg/dL (ref 0.0–1.2)
Bilirubin, Direct: 0.23 mg/dL (ref 0.00–0.40)
Total Protein: 7.3 g/dL (ref 6.0–8.5)

## 2023-03-29 LAB — CERULOPLASMIN: Ceruloplasmin: 29.5 mg/dL (ref 16.0–31.0)

## 2023-03-29 LAB — ANA: ANA Titer 1: NEGATIVE

## 2023-03-29 LAB — ALPHA-1-ANTITRYPSIN: A-1 Antitrypsin: 138 mg/dL (ref 101–187)

## 2023-03-29 LAB — ANTI-SMOOTH MUSCLE ANTIBODY, IGG: Smooth Muscle Ab: 13 Units (ref 0–19)

## 2023-04-03 ENCOUNTER — Encounter: Payer: Self-pay | Admitting: Family Medicine

## 2023-04-03 NOTE — Progress Notes (Signed)
Please mail to the patient 

## 2023-05-13 ENCOUNTER — Ambulatory Visit (INDEPENDENT_AMBULATORY_CARE_PROVIDER_SITE_OTHER): Payer: Medicare HMO

## 2023-05-13 VITALS — Ht 71.0 in | Wt 237.0 lb

## 2023-05-13 DIAGNOSIS — Z0000 Encounter for general adult medical examination without abnormal findings: Secondary | ICD-10-CM | POA: Diagnosis not present

## 2023-05-13 NOTE — Progress Notes (Signed)
 Subjective:   Manuel Torres is a 62 y.o. male who presents for Medicare Annual/Subsequent preventive examination.  Visit Complete: Virtual  I connected with  Manuel Torres on 05/13/23 by a audio enabled telemedicine application and verified that I am speaking with the correct person using two identifiers.  Patient Location: Home  Provider Location: Home Office  I discussed the limitations of evaluation and management by telemedicine. The patient expressed understanding and agreed to proceed.  Patient Medicare AWV questionnaire was completed by the patient on n/a; I have confirmed that all information answered by patient is correct and no changes since this date.  Review of Systems     Cardiac Risk Factors include: advanced age (>66men, >52 women);dyslipidemia;hypertension;male gender;obesity (BMI >30kg/m2)     Objective:    Today's Vitals   05/13/23 1110 05/13/23 1111  Weight: 237 lb (107.5 kg)   Height: 5\' 11"  (1.803 m)   PainSc:  5    Body mass index is 33.05 kg/m.     05/13/2023   11:20 AM 10/26/2022    9:19 AM 02/02/2017    1:30 PM 01/27/2017    2:40 PM 05/19/2016    7:48 PM 04/13/2016    9:03 AM 04/06/2016    9:05 AM  Advanced Directives  Does Patient Have a Medical Advance Directive? No No No No No No No  Would patient like information on creating a medical advance directive? No - Patient declined  No - Patient declined No - Patient declined  No - patient declined information No - patient declined information    Current Medications (verified) Outpatient Encounter Medications as of 05/13/2023  Medication Sig   allopurinol (ZYLOPRIM) 300 MG tablet Take 1 tablet (300 mg total) by mouth daily.   atorvastatin (LIPITOR) 80 MG tablet Take 1 tablet (80 mg total) by mouth daily.   gabapentin (NEURONTIN) 300 MG capsule Take 1 capsule (300 mg total) by mouth 2 (two) times daily.   hydrochlorothiazide (HYDRODIURIL) 25 MG tablet Take 1 tablet (25 mg total) by mouth daily.    latanoprost (XALATAN) 0.005 % ophthalmic solution Place 1 drop into both eyes at bedtime.   lisinopril (ZESTRIL) 10 MG tablet Take 1 tablet (10 mg total) by mouth daily.   metFORMIN (GLUCOPHAGE) 500 MG tablet TAKE 1/2 TABLET BY MOUTH TWICE A DAY   potassium chloride SA (KLOR-CON M20) 20 MEQ tablet TAKE 1 TABLET (20 MEQ TOTAL) BY MOUTH EVERY MORNING.   vitamin E 180 MG (400 UNITS) capsule Take 400 Units by mouth daily.   diclofenac (VOLTAREN) 75 MG EC tablet Take 1 tablet (75 mg total) by mouth 2 (two) times daily. (Patient not taking: Reported on 05/13/2023)   sildenafil (VIAGRA) 100 MG tablet TAKE 1/2 - 1 TABLET BY MOUTH BEFORE RELATIONS.   No facility-administered encounter medications on file as of 05/13/2023.    Allergies (verified) Robaxin [methocarbamol]   History: Past Medical History:  Diagnosis Date   Arthritis    Hip   Fatty liver 08/10/2017   Gout    NO RECENT FLARE UPS   Gout    Hypercholesteremia    Hypertension    Past Surgical History:  Procedure Laterality Date   BACK SURGERY     April 22, 2016, laminectomy    COLONOSCOPY  08/03/2012   Procedure: COLONOSCOPY;  Surgeon: Malissa Hippo, MD;  Location: [facility];  Service: Endoscopy;  Laterality: N/A;  830   COLONOSCOPY WITH PROPOFOL N/A 10/26/2022   Procedure: COLONOSCOPY WITH  PROPOFOL;  Surgeon: Lanelle Bal, DO;  Location: [facility];  Service: Endoscopy;  Laterality: N/A;  10:30am, asa 2   JOINT REPLACEMENT     r hip Dr. Despina Hick Dec 2013   right inguinal hernia     TOTAL HIP ARTHROPLASTY  10/23/2012   Procedure: TOTAL HIP ARTHROPLASTY;  Surgeon: Loanne Drilling, MD;  Location: [facility];  Service: Orthopedics;  Laterality: Right;   TOTAL HIP ARTHROPLASTY Left 02/02/2017   Procedure: LEFT TOTAL HIP ARTHROPLASTY ANTERIOR APPROACH;  Surgeon: Ollen Gross, MD;  Location: [facility];  Service: Orthopedics;  Laterality: Left;   Family History  Problem Relation Age of Onset   Colon cancer Other    Diabetes  Father    Heart disease Father    Hyperlipidemia Father    Social History   Socioeconomic History   Marital status: Married    Spouse name: Not on file   Number of children: Not on file   Years of education: Not on file   Highest education level: Not on file  Occupational History   Not on file  Tobacco Use   Smoking status: Never    Passive exposure: Current   Smokeless tobacco: Never  Substance and Sexual Activity   Alcohol use: Yes    Comment: occasionally   Drug use: No   Sexual activity: Not on file  Other Topics Concern   Not on file  Social History Narrative   Not on file   Social Determinants of Health   Financial Resource Strain: Low Risk  (05/13/2023)   Overall Financial Resource Strain (CARDIA)    Difficulty of Paying Living Expenses: Not hard at all  Food Insecurity: No Food Insecurity (05/13/2023)   Hunger Vital Sign    Worried About Running Out of Food in the Last Year: Never true    Ran Out of Food in the Last Year: Never true  Transportation Needs: No Transportation Needs (05/13/2023)   PRAPARE - Administrator, Civil Service (Medical): No    Lack of Transportation (Non-Medical): No  Physical Activity: Sufficiently Active (05/13/2023)   Exercise Vital Sign    Days of Exercise per Week: 7 days    Minutes of Exercise per Session: 30 min  Stress: No Stress Concern Present (05/13/2023)   [employer] of Occupational Health - Occupational Stress Questionnaire    Feeling of Stress : Not at all  Social Connections: Socially Integrated (05/13/2023)   Social Connection and Isolation Panel [NHANES]    Frequency of Communication with Friends and Family: More than three times a week    Frequency of Social Gatherings with Friends and Family: More than three times a week    Attends Religious Services: More than 4 times per year    Active Member of [employer] or Organizations: Yes    Attends Engineer, structural: More than 4 times per year     Marital Status: Married    Tobacco Counseling Counseling given: Yes   Clinical Intake:  Pre-visit preparation completed: Yes  Pain : 0-10 Pain Score: 5  Pain Type: Chronic pain Pain Location: Hip Pain Orientation: Right, Left Pain Descriptors / Indicators: Dull, Numbness Pain Frequency: Constant     BMI - recorded: 33.05 Nutritional Status: BMI > 30  Obese Nutritional Risks: None Diabetes: No  How often do you need to have someone help you when you read instructions, pamphlets, or other written materials from your doctor or pharmacy?: 1 - Never  Interpreter Needed?:  No  Information entered by ::  Manuel Torres, Manuel Torres   Activities of Daily Living    05/13/2023   11:14 AM  In your present state of health, do you have any difficulty performing the following activities:  Hearing? 0  Vision? 0  Difficulty concentrating or making decisions? 0  Walking or climbing stairs? 1  Comment chronic back pain, hx of bilateral hip replacements  Dressing or bathing? 0  Doing errands, shopping? 0  Preparing Food and eating ? N  Using the Toilet? N  In the past six months, have you accidently leaked urine? N  Do you have problems with loss of bowel control? N  Managing your Medications? N  Managing your Finances? N  Housekeeping or managing your Housekeeping? N    Patient Care Team: Babs Sciara, MD as PCP - General (Family Medicine)  Indicate any recent Medical Services you may have received from other than [facility] providers in the past year (date may be approximate).     Assessment:   This is a routine wellness examination for Manuel Torres.  Hearing/Vision screen Hearing Screening - Comments:: Patient denies any hearing difficulties.    Dietary issues and exercise activities discussed:     Goals Addressed             This Visit's Progress    Patient Stated       To get down to around 225# and increase activity       Depression Screen    05/13/2023   11:16 AM  02/02/2023    1:23 PM 12/08/2022   10:36 AM 11/10/2022    9:19 AM 06/01/2022    9:08 AM 04/07/2022    2:50 PM 06/23/2020    9:26 AM  PHQ 2/9 Scores  PHQ - 2 Score 0 0 0 0 0 0 0  PHQ- 9 Score  0 0 3       Fall Risk    05/13/2023   11:20 AM 02/02/2023    1:23 PM 11/10/2022    9:18 AM 06/23/2020    9:25 AM 05/20/2020    9:01 AM  Fall Risk   Falls in the past year? 0 0 0 0 0  Number falls in past yr: 0  0    Injury with Fall? 0  0    Risk for fall due to : No Fall Risks No Fall Risks     Follow up Falls prevention discussed Falls evaluation completed  Falls evaluation completed Falls evaluation completed    MEDICARE RISK AT HOME:  Medicare Risk at Home - 05/13/23 1120     Any stairs in or around the home? No    If so, are there any without handrails? No    Home free of loose throw rugs in walkways, pet beds, electrical cords, etc? Yes    Adequate lighting in your home to reduce risk of falls? Yes    Life alert? No    Use of a cane, walker or w/c? Yes    Grab bars in the bathroom? No    Shower chair or bench in shower? No    Elevated toilet seat or a handicapped toilet? No             TIMED UP AND GO:  Was the test performed?  No    Cognitive Function:        05/13/2023   11:20 AM  6CIT Screen  What Year? 0 points  What month? 0 points  What  time? 0 points  Count back from 20 0 points  Months in reverse 0 points  Repeat phrase 0 points  Total Score 0 points    Immunizations Immunization History  Administered Date(s) Administered   Influenza,inj,Quad PF,6+ Mos 10/04/2016, 07/26/2017, 11/10/2022   Influenza,inj,quad, With Preservative 09/15/2017   Moderna Sars-Covid-2 Vaccination 01/26/2020, 02/27/2020   Pneumococcal Polysaccharide-23 06/23/2020   Td 04/23/2014    TDAP status: Up to date  Flu Vaccine status: Up to date  Pneumococcal vaccine status: NOT AGE APPROPRIATE FOR THIS PATIENT  Covid-19 vaccine status: Information provided on how to obtain  vaccines.   Qualifies for Shingles Vaccine? Yes   Zostavax completed No   Shingrix Completed?: No.    Education has been provided regarding the importance of this vaccine. Patient has been advised to call insurance company to determine out of pocket expense if they have not yet received this vaccine. Advised may also receive vaccine at local pharmacy or Health Dept. Verbalized acceptance and understanding.  Screening Tests Health Maintenance  Topic Date Due   Zoster Vaccines- Shingrix (1 of 2) Never done   COVID-19 Vaccine (3 - Moderna risk series) 03/26/2020   Medicare Annual Wellness (AWV)  04/08/2023   INFLUENZA VACCINE  06/16/2023   HEMOGLOBIN A1C  09/23/2023   OPHTHALMOLOGY EXAM  11/24/2023   FOOT EXAM  12/09/2023   Diabetic kidney evaluation - eGFR measurement  12/21/2023   Diabetic kidney evaluation - Urine ACR  03/22/2024   DTaP/Tdap/Td (2 - Tdap) 04/23/2024   Colonoscopy  10/26/2032   Hepatitis C Screening  Completed   HIV Screening  Completed   HPV VACCINES  Aged Out    Health Maintenance  Health Maintenance Due  Topic Date Due   Zoster Vaccines- Shingrix (1 of 2) Never done   COVID-19 Vaccine (3 - Moderna risk series) 03/26/2020   Medicare Annual Wellness (AWV)  04/08/2023    Colorectal cancer screening: Type of screening: Colonoscopy. Completed 10/26/2022. Repeat every 10 years  Lung Cancer Screening: (Low Dose CT Chest recommended if Age 90-80 years, 20 pack-year currently smoking OR have quit w/in 15years.) does not qualify.   Lung Cancer Screening Referral: n/a  Additional Screening:  Hepatitis C Screening: does not qualify; Completed 03/23/23  Vision Screening: Recommended annual ophthalmology exams for early detection of glaucoma and other disorders of the eye. Is the patient up to date with their annual eye exam?  Yes  Who is the provider or what is the name of the office in which the patient attends annual eye exams? Dr. Earlene Plater in [place] If pt is not  established with a provider, would they like to be referred to a provider to establish care? No .   Dental Screening: Recommended annual dental exams for proper oral hygiene  Diabetic Foot Exam: n/a  Community Resource Referral / Chronic Care Management: CRR required this visit?  No   CCM required this visit?  No     Plan:     I have personally reviewed and noted the following in the patient's chart:   Medical and social history Use of alcohol, tobacco or illicit drugs  Current medications and supplements including opioid prescriptions. Patient is not currently taking opioid prescriptions. Functional ability and status Nutritional status Physical activity Advanced directives List of other physicians Hospitalizations, surgeries, and ER visits in previous 12 months Vitals Screenings to include cognitive, depression, and falls Referrals and appointments  In addition, I have reviewed and discussed with patient certain preventive protocols, quality metrics,  and best practice recommendations. A written personalized care plan for preventive services as well as general preventive health recommendations were provided to patient.   Any medications not marked as taking were not mentioned by the patient (or their caregiver if applicable) when reconciling the medications.  Because this visit was a virtual/telehealth visit,  certain criteria was not obtained, such a blood pressure, CBG if patient is a diabetic, and timed up and go.    Jordan Hawks Manuel Torres, Manuel Torres   05/13/2023   After Visit Summary: (Mail) Due to this being a telephonic visit, the after visit summary with patients personalized plan was offered to patient via mail   Nurse Notes:

## 2023-05-13 NOTE — Patient Instructions (Signed)
Mr. Manuel Torres , Thank you for taking time to come for your Medicare Wellness Visit. I appreciate your ongoing commitment to your health goals. Please review the following plan we discussed and let me know if I can assist you in the future.   These are the goals we discussed:  Goals      Patient Stated     To get down to around 225# and increase activity        This is a list of the screening recommended for you and due dates:  Health Maintenance  Topic Date Due   Zoster (Shingles) Vaccine (1 of 2) Never done   COVID-19 Vaccine (3 - Moderna risk series) 03/26/2020   Flu Shot  06/16/2023   Hemoglobin A1C  09/23/2023   Eye exam for diabetics  11/24/2023   Complete foot exam   12/09/2023   Yearly kidney function blood test for diabetes  12/21/2023   Yearly kidney health urinalysis for diabetes  03/22/2024   DTaP/Tdap/Td vaccine (2 - Tdap) 04/23/2024   Medicare Annual Wellness Visit  05/12/2024   Colon Cancer Screening  10/26/2032   Hepatitis C Screening  Completed   HIV Screening  Completed   HPV Vaccine  Aged Out    Advanced directives: Advance directive discussed with you today. Even though you declined this today, please call our office should you change your mind, and we can give you the proper paperwork for you to fill out. Advance care planning is a way to make decisions about medical care that fits your values in case you are ever unable to make these decisions for yourself.  Information on Advanced Care Planning can be found at [facility] Advance Health Care Directives Advance Health Care Directives ([email or web address]    Conditions/risks identified:  You are due for the vaccines checked below. You may have these done at your preferred pharmacy. Please have them fax the office proof of the vaccines so that we can update your chart.   [x]  Shingrix (Shingles vaccine) []  Pneumonia Vaccines []  TDAP (Tetanus) Vaccine [x]  Covid-19   Next appointment: VIRTUAL/  TELEPHONE VISIT Follow up in one year for your annual wellness visit  May 25, 2024 at 10:30am TELEPHONE VISIT   Preventive Care 60-64 Years, Male Preventive care refers to lifestyle choices and visits with your health care provider that can promote health and wellness. What does preventive care include? A yearly physical exam. This is also called an annual well check. Dental exams once or twice a year. Routine eye exams. Ask your health care provider how often you should have your eyes checked. Personal lifestyle choices, including: Daily care of your teeth and gums. Regular physical activity. Eating a healthy diet. Avoiding tobacco and drug use. Limiting alcohol use. Practicing safe sex. Taking low-dose aspirin every day starting at age 75. What happens during an annual well check? The services and screenings done by your health care provider during your annual well check will depend on your age, overall health, lifestyle risk factors, and family history of disease. Counseling  Your health care provider may ask you questions about your: Alcohol use. Tobacco use. Drug use. Emotional well-being. Home and relationship well-being. Sexual activity. Eating habits. Work and work Astronomer. Screening  You may have the following tests or measurements: Height, weight, and BMI. Blood pressure. Lipid and cholesterol levels. These may be checked every 5 years, or more frequently if you are over 21 years old. Skin check. Lung cancer screening. You  may have this screening every year starting at age 52 if you have a 30-pack-year history of smoking and currently smoke or have quit within the past 15 years. Fecal occult blood test (FOBT) of the stool. You may have this test every year starting at age 56. Flexible sigmoidoscopy or colonoscopy. You may have a sigmoidoscopy every 5 years or a colonoscopy every 10 years starting at age 68. Prostate cancer screening. Recommendations will vary  depending on your family history and other risks. Hepatitis C blood test. Hepatitis B blood test. Sexually transmitted disease (STD) testing. Diabetes screening. This is done by checking your blood sugar (glucose) after you have not eaten for a while (fasting). You may have this done every 1-3 years. Discuss your test results, treatment options, and if necessary, the need for more tests with your health care provider. Vaccines  Your health care provider may recommend certain vaccines, such as: Influenza vaccine. This is recommended every year. Tetanus, diphtheria, and acellular pertussis (Tdap, Td) vaccine. You may need a Td booster every 10 years. Zoster vaccine. You may need this after age 67. Pneumococcal 13-valent conjugate (PCV13) vaccine. You may need this if you have certain conditions and have not been vaccinated. Pneumococcal polysaccharide (PPSV23) vaccine. You may need one or two doses if you smoke cigarettes or if you have certain conditions. Talk to your health care provider about which screenings and vaccines you need and how often you need them. This information is not intended to replace advice given to you by your health care provider. Make sure you discuss any questions you have with your health care provider. Document Released: 11/28/2015 Document Revised: 07/21/2016 Document Reviewed: 09/02/2015 Elsevier Interactive Patient Education  2017 [employer].  Fall Prevention in the Home Falls can cause injuries. They can happen to people of all ages. There are many things you can do to make your home safe and to help prevent falls. What can I do on the outside of my home? Regularly fix the edges of walkways and driveways and fix any cracks. Remove anything that might make you trip as you walk through a door, such as a raised step or threshold. Trim any bushes or trees on the path to your home. Use bright outdoor lighting. Clear any walking paths of anything that might make  someone trip, such as rocks or tools. Regularly check to see if handrails are loose or broken. Make sure that both sides of any steps have handrails. Any raised decks and porches should have guardrails on the edges. Have any leaves, snow, or ice cleared regularly. Use sand or salt on walking paths during winter. Clean up any spills in your garage right away. This includes oil or grease spills. What can I do in the bathroom? Use night lights. Install grab bars by the toilet and in the tub and shower. Do not use towel bars as grab bars. Use non-skid mats or decals in the tub or shower. If you need to sit down in the shower, use a plastic, non-slip stool. Keep the floor dry. Clean up any water that spills on the floor as soon as it happens. Remove soap buildup in the tub or shower regularly. Attach bath mats securely with double-sided non-slip rug tape. Do not have throw rugs and other things on the floor that can make you trip. What can I do in the bedroom? Use night lights. Make sure that you have a light by your bed that is easy to reach. Do not  use any sheets or blankets that are too big for your bed. They should not hang down onto the floor. Have a firm chair that has side arms. You can use this for support while you get dressed. Do not have throw rugs and other things on the floor that can make you trip. What can I do in the kitchen? Clean up any spills right away. Avoid walking on wet floors. Keep items that you use a lot in easy-to-reach places. If you need to reach something above you, use a strong step stool that has a grab bar. Keep electrical cords out of the way. Do not use floor polish or wax that makes floors slippery. If you must use wax, use non-skid floor wax. Do not have throw rugs and other things on the floor that can make you trip. What can I do with my stairs? Do not leave any items on the stairs. Make sure that there are handrails on both sides of the stairs and  use them. Fix handrails that are broken or loose. Make sure that handrails are as long as the stairways. Check any carpeting to make sure that it is firmly attached to the stairs. Fix any carpet that is loose or worn. Avoid having throw rugs at the top or bottom of the stairs. If you do have throw rugs, attach them to the floor with carpet tape. Make sure that you have a light switch at the top of the stairs and the bottom of the stairs. If you do not have them, ask someone to add them for you. What else can I do to help prevent falls? Wear shoes that: Do not have high heels. Have rubber bottoms. Are comfortable and fit you well. Are closed at the toe. Do not wear sandals. If you use a stepladder: Make sure that it is fully opened. Do not climb a closed stepladder. Make sure that both sides of the stepladder are locked into place. Ask someone to hold it for you, if possible. Clearly mark and make sure that you can see: Any grab bars or handrails. First and last steps. Where the edge of each step is. Use tools that help you move around (mobility aids) if they are needed. These include: Canes. Walkers. Scooters. Crutches. Turn on the lights when you go into a dark area. Replace any light bulbs as soon as they burn out. Set up your furniture so you have a clear path. Avoid moving your furniture around. If any of your floors are uneven, fix them. If there are any pets around you, be aware of where they are. Review your medicines with your doctor. Some medicines can make you feel dizzy. This can increase your chance of falling. Ask your doctor what other things that you can do to help prevent falls. This information is not intended to replace advice given to you by your health care provider. Make sure you discuss any questions you have with your health care provider. Document Released: 08/28/2009 Document Revised: 04/08/2016 Document Reviewed: 12/06/2014 Elsevier Interactive Patient  Education  2017 [employer].

## 2023-05-23 DIAGNOSIS — H401221 Low-tension glaucoma, left eye, mild stage: Secondary | ICD-10-CM | POA: Diagnosis not present

## 2023-05-23 DIAGNOSIS — H524 Presbyopia: Secondary | ICD-10-CM | POA: Diagnosis not present

## 2023-06-08 ENCOUNTER — Ambulatory Visit (INDEPENDENT_AMBULATORY_CARE_PROVIDER_SITE_OTHER): Payer: Medicare HMO | Admitting: Family Medicine

## 2023-06-08 VITALS — BP 128/76 | HR 55 | Wt 239.2 lb

## 2023-06-08 DIAGNOSIS — E785 Hyperlipidemia, unspecified: Secondary | ICD-10-CM

## 2023-06-08 DIAGNOSIS — I10 Essential (primary) hypertension: Secondary | ICD-10-CM

## 2023-06-08 DIAGNOSIS — E1169 Type 2 diabetes mellitus with other specified complication: Secondary | ICD-10-CM | POA: Diagnosis not present

## 2023-06-08 DIAGNOSIS — K760 Fatty (change of) liver, not elsewhere classified: Secondary | ICD-10-CM

## 2023-06-08 MED ORDER — POTASSIUM CHLORIDE CRYS ER 20 MEQ PO TBCR: EXTENDED_RELEASE_TABLET | ORAL | 1 refills | Status: DC

## 2023-06-08 MED ORDER — HYDROCHLOROTHIAZIDE 25 MG PO TABS: 25.0000 mg | ORAL_TABLET | Freq: Every day | ORAL | 1 refills | Status: DC

## 2023-06-08 MED ORDER — METFORMIN HCL 500 MG PO TABS: ORAL_TABLET | ORAL | 1 refills | Status: DC

## 2023-06-08 MED ORDER — GABAPENTIN 300 MG PO CAPS: 300.0000 mg | ORAL_CAPSULE | Freq: Two times a day (BID) | ORAL | 1 refills | Status: DC

## 2023-06-08 MED ORDER — ALLOPURINOL 300 MG PO TABS: 300.0000 mg | ORAL_TABLET | Freq: Every day | ORAL | 1 refills | Status: DC

## 2023-06-08 MED ORDER — ATORVASTATIN CALCIUM 80 MG PO TABS: 80.0000 mg | ORAL_TABLET | Freq: Every day | ORAL | 3 refills | Status: DC

## 2023-06-08 MED ORDER — LISINOPRIL 10 MG PO TABS: 10.0000 mg | ORAL_TABLET | Freq: Every day | ORAL | 1 refills | Status: DC

## 2023-06-08 NOTE — Patient Instructions (Signed)

## 2023-06-08 NOTE — Progress Notes (Signed)
   Subjective:    Patient ID: Manuel Torres, male    DOB: 10/06/1961, 62 y.o.   MRN: [id number]  HPI Patient arrives today for 6 month follow up. Patient states no issues or concerns.  Patient presents follow-up Outpatient Encounter Medications as of 06/08/2023  Medication Sig   latanoprost (XALATAN) 0.005 % ophthalmic solution Place 1 drop into both eyes at bedtime.   sildenafil (VIAGRA) 100 MG tablet TAKE 1/2 - 1 TABLET BY MOUTH BEFORE RELATIONS.   vitamin E 180 MG (400 UNITS) capsule Take 400 Units by mouth daily.   [DISCONTINUED] allopurinol (ZYLOPRIM) 300 MG tablet Take 1 tablet (300 mg total) by mouth daily.   [DISCONTINUED] atorvastatin (LIPITOR) 80 MG tablet Take 1 tablet (80 mg total) by mouth daily.   [DISCONTINUED] gabapentin (NEURONTIN) 300 MG capsule Take 1 capsule (300 mg total) by mouth 2 (two) times daily.   [DISCONTINUED] hydrochlorothiazide (HYDRODIURIL) 25 MG tablet Take 1 tablet (25 mg total) by mouth daily.   [DISCONTINUED] lisinopril (ZESTRIL) 10 MG tablet Take 1 tablet (10 mg total) by mouth daily.   [DISCONTINUED] metFORMIN (GLUCOPHAGE) 500 MG tablet TAKE 1/2 TABLET BY MOUTH TWICE A DAY   [DISCONTINUED] potassium chloride SA (KLOR-CON M20) 20 MEQ tablet TAKE 1 TABLET (20 MEQ TOTAL) BY MOUTH EVERY MORNING.   allopurinol (ZYLOPRIM) 300 MG tablet Take 1 tablet (300 mg total) by mouth daily.   atorvastatin (LIPITOR) 80 MG tablet Take 1 tablet (80 mg total) by mouth daily.   diclofenac (VOLTAREN) 75 MG EC tablet Take 1 tablet (75 mg total) by mouth 2 (two) times daily. (Patient not taking: Reported on 05/13/2023)   gabapentin (NEURONTIN) 300 MG capsule Take 1 capsule (300 mg total) by mouth 2 (two) times daily.   hydrochlorothiazide (HYDRODIURIL) 25 MG tablet Take 1 tablet (25 mg total) by mouth daily.   lisinopril (ZESTRIL) 10 MG tablet Take 1 tablet (10 mg total) by mouth daily.   metFORMIN (GLUCOPHAGE) 500 MG tablet TAKE 1/2 TABLET BY MOUTH TWICE A DAY   potassium  chloride SA (KLOR-CON M20) 20 MEQ tablet TAKE 1 TABLET (20 MEQ TOTAL) BY MOUTH EVERY MORNING.   [DISCONTINUED] aspirin EC 81 MG tablet Take 81 mg by mouth daily.   No facility-administered encounter medications on file as of 06/08/2023.   Medications were reviewed in detail updated Patient taking blood pressure medicine regular basis not have any problems Taking his metformin tolerating well Trying to watch diet closely Taking cholesterol medicine regular basis no problems Gabapentin helping with sciatica issues Patient staying physically active watching diet try to keep his weight down    Review of Systems     Objective:   Physical Exam  General-in no acute distress Eyes-no discharge Lungs-respiratory rate normal, CTA CV-no murmurs,RRR Extremities skin warm dry no edema Neuro grossly normal Behavior normal, alert  Labs from May as well as specialist reviewed     Assessment & Plan:  1. Essential hypertension, benign HTN- patient seen for follow-up regarding HTN.   Diet, medication compliance, appropriate labs and refills were completed.   Importance of keeping blood pressure under good control to lessen the risk of complications discussed Regular follow-up visits discussed   2. Fatty liver Portion control regular physical activity try to keep weight down  3. Hyperlipidemia associated with type 2 diabetes mellitus (HCC) Continue cholesterol medicine healthy diet follow-up by a 4 to 6 months from now

## 2023-09-29 ENCOUNTER — Ambulatory Visit: Payer: Medicare HMO | Admitting: Internal Medicine

## 2023-09-29 ENCOUNTER — Encounter: Payer: Self-pay | Admitting: Internal Medicine

## 2023-09-29 VITALS — BP 138/80 | HR 56 | Temp 98.6°F | Ht 71.0 in | Wt 238.0 lb

## 2023-09-29 DIAGNOSIS — Z1211 Encounter for screening for malignant neoplasm of colon: Secondary | ICD-10-CM

## 2023-09-29 DIAGNOSIS — K7581 Nonalcoholic steatohepatitis (NASH): Secondary | ICD-10-CM

## 2023-09-29 DIAGNOSIS — R7989 Other specified abnormal findings of blood chemistry: Secondary | ICD-10-CM | POA: Diagnosis not present

## 2023-09-29 DIAGNOSIS — E669 Obesity, unspecified: Secondary | ICD-10-CM

## 2023-09-29 DIAGNOSIS — E66811 Obesity, class 1: Secondary | ICD-10-CM

## 2023-09-29 NOTE — Progress Notes (Signed)
Primary Care Physician:  Babs Sciara, MD Primary Gastroenterologist:  Dr. Marletta Lor  Chief Complaint  Patient presents with   Follow-up    Follow up on liver labs    HPI:   Manuel Torres is a 62 y.o. male who presents to clinic today for follow up visit.  Patient has had abnormal aminotransferases going back to 2017.  Serological workup with negative smooth muscle, negative viral hepatitis studies, normal iron studies, normal ESR, ANA positive, normal ceruloplasmin, IgG WNL, A1A normal.   Most recent blood work 03/23/2023 with AST 70, ALT 56, T. bili 0.9, alk phos 92.    Ultrasound with elastography 01/01/22 which I personally reviewed Showed normal liver, median KPA of 6.  Risk factors for fatty liver disease include dyslipidemia, prediabetes, obesity.  Very seldom uses alcohol.  No family history of liver disease.  No herbal supplements.  Currently on a statin.  Colonoscopy 10/26/2022 unremarkable.  Denies any upper GI symptoms including heartburn, reflux, dysphagia/odynophagia, epigastric or chest pain.  Past Medical History:  Diagnosis Date   Arthritis    Hip   Fatty liver 08/10/2017   Gout    NO RECENT FLARE UPS   Gout    Hypercholesteremia    Hypertension     Past Surgical History:  Procedure Laterality Date   BACK SURGERY     April 22, 2016, laminectomy    COLONOSCOPY  08/03/2012   Procedure: COLONOSCOPY;  Surgeon: Malissa Hippo, MD;  Location: [facility];  Service: Endoscopy;  Laterality: N/A;  830   COLONOSCOPY WITH PROPOFOL N/A 10/26/2022   Procedure: COLONOSCOPY WITH PROPOFOL;  Surgeon: Lanelle Bal, DO;  Location: [facility];  Service: Endoscopy;  Laterality: N/A;  10:30am, asa 2   JOINT REPLACEMENT     r hip Dr. Despina Hick Dec 2013   right inguinal hernia     TOTAL HIP ARTHROPLASTY  10/23/2012   Procedure: TOTAL HIP ARTHROPLASTY;  Surgeon: Loanne Drilling, MD;  Location: [facility];  Service: Orthopedics;  Laterality: Right;   TOTAL HIP  ARTHROPLASTY Left 02/02/2017   Procedure: LEFT TOTAL HIP ARTHROPLASTY ANTERIOR APPROACH;  Surgeon: Ollen Gross, MD;  Location: [facility];  Service: Orthopedics;  Laterality: Left;    Current Outpatient Medications  Medication Sig Dispense Refill   allopurinol (ZYLOPRIM) 300 MG tablet Take 1 tablet (300 mg total) by mouth daily. 90 tablet 1   atorvastatin (LIPITOR) 80 MG tablet Take 1 tablet (80 mg total) by mouth daily. 90 tablet 3   diclofenac (VOLTAREN) 75 MG EC tablet Take 1 tablet (75 mg total) by mouth 2 (two) times daily. (Patient not taking: Reported on 05/13/2023) 30 tablet 0   gabapentin (NEURONTIN) 300 MG capsule Take 1 capsule (300 mg total) by mouth 2 (two) times daily. 180 capsule 1   hydrochlorothiazide (HYDRODIURIL) 25 MG tablet Take 1 tablet (25 mg total) by mouth daily. 90 tablet 1   latanoprost (XALATAN) 0.005 % ophthalmic solution Place 1 drop into both eyes at bedtime.     lisinopril (ZESTRIL) 10 MG tablet Take 1 tablet (10 mg total) by mouth daily. 90 tablet 1   metFORMIN (GLUCOPHAGE) 500 MG tablet TAKE 1/2 TABLET BY MOUTH TWICE A DAY 90 tablet 1   potassium chloride SA (KLOR-CON M20) 20 MEQ tablet TAKE 1 TABLET (20 MEQ TOTAL) BY MOUTH EVERY MORNING. 90 tablet 1   sildenafil (VIAGRA) 100 MG tablet TAKE 1/2 - 1 TABLET BY MOUTH BEFORE RELATIONS. 20 tablet 5  vitamin E 180 MG (400 UNITS) capsule Take 400 Units by mouth daily.     No current facility-administered medications for this visit.    Allergies as of 09/29/2023 - Review Complete 06/08/2023  Allergen Reaction Noted   Robaxin [methocarbamol]  04/02/2016    Family History  Problem Relation Age of Onset   Colon cancer Other    Diabetes Father    Heart disease Father    Hyperlipidemia Father     Social History   Socioeconomic History   Marital status: Married    Spouse name: Not on file   Number of children: Not on file   Years of education: Not on file   Highest education level: Not on file  Occupational  History   Not on file  Tobacco Use   Smoking status: Never    Passive exposure: Current   Smokeless tobacco: Never  Substance and Sexual Activity   Alcohol use: Yes    Comment: occasionally   Drug use: No   Sexual activity: Not on file  Other Topics Concern   Not on file  Social History Narrative   Not on file   Social Determinants of Health   Financial Resource Strain: Low Risk  (05/13/2023)   Overall Financial Resource Strain (CARDIA)    Difficulty of Paying Living Expenses: Not hard at all  Food Insecurity: No Food Insecurity (05/13/2023)   Hunger Vital Sign    Worried About Running Out of Food in the Last Year: Never true    Ran Out of Food in the Last Year: Never true  Transportation Needs: No Transportation Needs (05/13/2023)   PRAPARE - Administrator, Civil Service (Medical): No    Lack of Transportation (Non-Medical): No  Physical Activity: Sufficiently Active (05/13/2023)   Exercise Vital Sign    Days of Exercise per Week: 7 days    Minutes of Exercise per Session: 30 min  Stress: No Stress Concern Present (05/13/2023)   [employer] of Occupational Health - Occupational Stress Questionnaire    Feeling of Stress : Not at all  Social Connections: Socially Integrated (05/13/2023)   Social Connection and Isolation Panel [NHANES]    Frequency of Communication with Friends and Family: More than three times a week    Frequency of Social Gatherings with Friends and Family: More than three times a week    Attends Religious Services: More than 4 times per year    Active Member of [employer] or Organizations: Yes    Attends Engineer, structural: More than 4 times per year    Marital Status: Married  Catering manager Violence: Not At Risk (05/13/2023)   Humiliation, Afraid, Rape, and Kick questionnaire    Fear of Current or Ex-Partner: No    Emotionally Abused: No    Physically Abused: No    Sexually Abused: No    Subjective: Review of Systems   Constitutional:  Negative for chills and fever.  HENT:  Negative for congestion and hearing loss.   Eyes:  Negative for blurred vision and double vision.  Respiratory:  Negative for cough and shortness of breath.   Cardiovascular:  Negative for chest pain and palpitations.  Gastrointestinal:  Negative for abdominal pain, blood in stool, constipation, diarrhea, heartburn, melena and vomiting.  Genitourinary:  Negative for dysuria and urgency.  Musculoskeletal:  Negative for joint pain and myalgias.  Skin:  Negative for itching and rash.  Neurological:  Negative for dizziness and headaches.  Psychiatric/Behavioral:  Negative for  depression. The patient is not nervous/anxious.        Objective: BP 138/80   Pulse (!) 56   Temp 98.6 F (37 C)   Ht 5\' 11"  (1.803 m)   Wt 238 lb (108 kg)   BMI 33.19 kg/m  Physical Exam Constitutional:      Appearance: Normal appearance.  HENT:     Head: Normocephalic and atraumatic.  Eyes:     Extraocular Movements: Extraocular movements intact.     Conjunctiva/sclera: Conjunctivae normal.  Cardiovascular:     Rate and Rhythm: Normal rate and regular rhythm.  Pulmonary:     Effort: Pulmonary effort is normal.     Breath sounds: Normal breath sounds.  Abdominal:     General: Bowel sounds are normal.     Palpations: Abdomen is soft.  Musculoskeletal:        General: Normal range of motion.     Cervical back: Normal range of motion and neck supple.  Skin:    General: Skin is warm.  Neurological:     General: No focal deficit present.     Mental Status: He is alert and oriented to person, place, and time.  Psychiatric:        Mood and Affect: Mood normal.        Behavior: Behavior normal.      Assessment: *Abnormal LFTs *MASH-FIB-4 1.88 *Obesity *Colon cancer screening  Plan: Patient's mildly abnormal LFTs likely due to [facility].  Discussed fatty liver in depth with patient today.  Has lost 40 pounds though seems to have plateaued  recently.  Weight the same as it was 6 months ago.  Update blood work today.  Recommend 1-2# weight loss per week until ideal body weight through exercise & diet. Low fat/cholesterol diet.   Avoid sweets, sodas, fruit juices, sweetened beverages like tea, etc. Gradually increase exercise from 15 min daily up to 1 hr per day 5 days/week. Limit alcohol use.  Ultrasound with elastography with median K PA of 6.  Continue statin for dyslipidemia.  Metformin for prediabetes.  Discussed role of GLP-1's and fatty liver disease.  He will discuss further with Dr. Gerda Diss.  Follow-up in 6 months.     09/29/2023 1:40 PM   Disclaimer: This note was dictated with voice recognition software. Similar sounding words can inadvertently be transcribed and may not be corrected upon review.

## 2023-09-29 NOTE — Patient Instructions (Signed)
I am happy to hear that you are doing well.  I am going to recheck your liver tests today.  We will call with results.  Recommend 1-2# weight loss per week until ideal body weight through exercise & diet. Low fat/cholesterol diet.   Avoid sweets, sodas, fruit juices, sweetened beverages like tea, etc. Gradually increase exercise from 15 min daily up to 1 hr per day 5 days/week. Limit alcohol use.  Follow-up in 6 months or sooner if needed.  It was very nice seeing you again today.  Dr. Marletta Lor

## 2023-10-06 DIAGNOSIS — E66811 Obesity, class 1: Secondary | ICD-10-CM | POA: Diagnosis not present

## 2023-10-06 DIAGNOSIS — K7581 Nonalcoholic steatohepatitis (NASH): Secondary | ICD-10-CM | POA: Diagnosis not present

## 2023-10-06 DIAGNOSIS — Z1211 Encounter for screening for malignant neoplasm of colon: Secondary | ICD-10-CM | POA: Diagnosis not present

## 2023-10-06 DIAGNOSIS — R7989 Other specified abnormal findings of blood chemistry: Secondary | ICD-10-CM | POA: Diagnosis not present

## 2023-10-07 LAB — COMPREHENSIVE METABOLIC PANEL
ALT: 40 [IU]/L (ref 0–44)
AST: 51 [IU]/L — ABNORMAL HIGH (ref 0–40)
Albumin: 4.1 g/dL (ref 3.9–4.9)
Alkaline Phosphatase: 93 [IU]/L (ref 44–121)
BUN/Creatinine Ratio: 15 (ref 10–24)
BUN: 13 mg/dL (ref 8–27)
Bilirubin Total: 0.6 mg/dL (ref 0.0–1.2)
CO2: 23 mmol/L (ref 20–29)
Calcium: 9.8 mg/dL (ref 8.6–10.2)
Chloride: 101 mmol/L (ref 96–106)
Creatinine, Ser: 0.88 mg/dL (ref 0.76–1.27)
Globulin, Total: 3.4 g/dL (ref 1.5–4.5)
Glucose: 98 mg/dL (ref 70–99)
Potassium: 4.5 mmol/L (ref 3.5–5.2)
Sodium: 140 mmol/L (ref 134–144)
Total Protein: 7.5 g/dL (ref 6.0–8.5)
eGFR: 97 mL/min/{1.73_m2} (ref >59)

## 2023-10-07 LAB — CBC
Hematocrit: 44.2 % (ref 37.5–51.0)
Hemoglobin: 14.9 g/dL (ref 13.0–17.7)
MCH: 32.7 pg (ref 26.6–33.0)
MCHC: 33.7 g/dL (ref 31.5–35.7)
MCV: 97 fL (ref 79–97)
Platelets: 282 10*3/uL (ref 150–450)
RBC: 4.56 x10E6/uL (ref 4.14–5.80)
RDW: 12.5 % (ref 11.6–15.4)
WBC: 5.9 10*3/uL (ref 3.4–10.8)

## 2023-10-07 LAB — HEPATITIS A ANTIBODY, TOTAL: hep A Total Ab: POSITIVE — AB

## 2023-10-07 LAB — HEPATITIS B SURFACE ANTIBODY,QUALITATIVE: Hep B Surface Ab, Qual: NONREACTIVE

## 2023-10-21 ENCOUNTER — Telehealth: Payer: Self-pay | Admitting: Family Medicine

## 2023-10-21 ENCOUNTER — Other Ambulatory Visit: Payer: Self-pay

## 2023-10-21 MED ORDER — ALLOPURINOL 300 MG PO TABS: 300.0000 mg | ORAL_TABLET | Freq: Every day | ORAL | 1 refills | Status: DC

## 2023-10-21 MED ORDER — SILDENAFIL CITRATE 100 MG PO TABS: ORAL_TABLET | ORAL | 5 refills | Status: DC

## 2023-10-21 NOTE — Telephone Encounter (Signed)
Refill on  allopurinol (ZYLOPRIM) 300 MG tablet , allopurinol (ZYLOPRIM) 300 MG tablet ,   sildenafil (VIAGRA) 100 MG tablet  Send to [facility]

## 2023-10-21 NOTE — Telephone Encounter (Signed)
Refill sent to [facility]

## 2023-10-25 ENCOUNTER — Other Ambulatory Visit: Payer: Self-pay

## 2023-10-25 ENCOUNTER — Telehealth: Payer: Self-pay | Admitting: Family Medicine

## 2023-10-25 MED ORDER — POTASSIUM CHLORIDE CRYS ER 20 MEQ PO TBCR: EXTENDED_RELEASE_TABLET | ORAL | 1 refills | Status: DC

## 2023-10-25 MED ORDER — LISINOPRIL 10 MG PO TABS: 10.0000 mg | ORAL_TABLET | Freq: Every day | ORAL | 1 refills | Status: DC

## 2023-10-25 MED ORDER — HYDROCHLOROTHIAZIDE 25 MG PO TABS: 25.0000 mg | ORAL_TABLET | Freq: Every day | ORAL | 1 refills | Status: DC

## 2023-10-25 NOTE — Telephone Encounter (Signed)
Refill on lisinopril (ZESTRIL) 10 MG tablet , hydrochlorothiazide (HYDRODIURIL) 25 MG tablet , Klor-con M20 MEQ tablet.extended release send to [facility] pharmacy

## 2023-10-31 ENCOUNTER — Telehealth: Payer: Self-pay | Admitting: Family Medicine

## 2023-10-31 NOTE — Telephone Encounter (Signed)
Patient has a appointment in January before that visit lipid, liver, metabolic 7, uric acid, A1c  Diagnosis history of gout, hyperlipidemia, prediabetes, hypertension  Please order the labs and also notify the patient either by phone call or MyChart message for him to do labs before his follow-up visit in January thank you

## 2023-11-01 ENCOUNTER — Other Ambulatory Visit: Payer: Self-pay

## 2023-11-01 DIAGNOSIS — R7303 Prediabetes: Secondary | ICD-10-CM

## 2023-11-01 DIAGNOSIS — K760 Fatty (change of) liver, not elsewhere classified: Secondary | ICD-10-CM

## 2023-11-01 DIAGNOSIS — E785 Hyperlipidemia, unspecified: Secondary | ICD-10-CM

## 2023-11-01 DIAGNOSIS — I10 Essential (primary) hypertension: Secondary | ICD-10-CM

## 2023-11-01 DIAGNOSIS — M109 Gout, unspecified: Secondary | ICD-10-CM

## 2023-11-01 DIAGNOSIS — Z79899 Other long term (current) drug therapy: Secondary | ICD-10-CM

## 2023-11-01 NOTE — Telephone Encounter (Signed)
Labs have been ordered, mychart message sent to inform pt

## 2023-11-24 DIAGNOSIS — H401121 Primary open-angle glaucoma, left eye, mild stage: Secondary | ICD-10-CM | POA: Diagnosis not present

## 2023-11-24 LAB — HM DIABETES EYE EXAM

## 2023-12-12 ENCOUNTER — Ambulatory Visit (INDEPENDENT_AMBULATORY_CARE_PROVIDER_SITE_OTHER): Payer: Medicare HMO | Admitting: Family Medicine

## 2023-12-12 VITALS — BP 136/78 | HR 76 | Temp 97.9°F | Ht 71.0 in | Wt 242.8 lb

## 2023-12-12 DIAGNOSIS — Z23 Encounter for immunization: Secondary | ICD-10-CM

## 2023-12-12 DIAGNOSIS — M109 Gout, unspecified: Secondary | ICD-10-CM

## 2023-12-12 DIAGNOSIS — I10 Essential (primary) hypertension: Secondary | ICD-10-CM | POA: Diagnosis not present

## 2023-12-12 DIAGNOSIS — E785 Hyperlipidemia, unspecified: Secondary | ICD-10-CM

## 2023-12-12 DIAGNOSIS — R7303 Prediabetes: Secondary | ICD-10-CM | POA: Diagnosis not present

## 2023-12-12 MED ORDER — GABAPENTIN 300 MG PO CAPS: 300.0000 mg | ORAL_CAPSULE | Freq: Two times a day (BID) | ORAL | 1 refills | Status: DC

## 2023-12-12 MED ORDER — ALLOPURINOL 300 MG PO TABS: 300.0000 mg | ORAL_TABLET | Freq: Every day | ORAL | 1 refills | Status: DC

## 2023-12-12 MED ORDER — METFORMIN HCL 500 MG PO TABS: ORAL_TABLET | ORAL | 1 refills | Status: DC

## 2023-12-12 MED ORDER — LISINOPRIL 10 MG PO TABS: 10.0000 mg | ORAL_TABLET | Freq: Every day | ORAL | 1 refills | Status: DC

## 2023-12-12 MED ORDER — POTASSIUM CHLORIDE CRYS ER 20 MEQ PO TBCR: EXTENDED_RELEASE_TABLET | ORAL | 1 refills | Status: DC

## 2023-12-12 MED ORDER — HYDROCHLOROTHIAZIDE 25 MG PO TABS: 25.0000 mg | ORAL_TABLET | Freq: Every day | ORAL | 1 refills | Status: DC

## 2023-12-12 MED ORDER — ATORVASTATIN CALCIUM 80 MG PO TABS: 80.0000 mg | ORAL_TABLET | Freq: Every day | ORAL | 3 refills | Status: DC

## 2023-12-12 NOTE — Progress Notes (Signed)
Subjective:    Patient ID: Manuel Torres, male    DOB: 10/16/1961, 63 y.o.   MRN: [id number]  Discussed the use of AI scribe software for clinical note transcription with the patient, who gave verbal consent to proceed.  History of Present Illness   The patient, with a history of gout, hyperlipidemia, and hypertension, has been experiencing fluctuations in weight due to inconsistent dietary choices. He reports a preference for chips over fruit, which he believes is contributing to his weight changes. Despite this, his bowel movements are regular and he has a satisfactory urinary flow. Nocturia is occasional and seems to be dependent on his daily water intake.  The patient has been less active recently, citing a preference for avoiding crowded places during the winter months due to the risk of viral infections. He expresses a desire to return to healthier habits, including regular exercise at the local [employer].  Regarding his medication regimen, the patient is currently taking allopurinol, atorvastatin, gabapentin, hydrochlorothiazide, lisinopril, metformin, and potassium. He reports good adherence to this regimen and has organized his medications into a weekly pill organizer to ensure consistent intake. The gabapentin is taken twice daily and appears to be effectively managing his leg pain without causing drowsiness or other side effects. He also takes Viagra as needed and supplements his diet with Vitamin E.  The patient has been experiencing some sleep disturbances, with difficulty both falling asleep and staying asleep. He reports waking up at various times during the night, which seems to be influenced by his bedtime. He has been using the television to help him fall back asleep, but expresses a willingness to try other strategies to improve his sleep quality.  In terms of physical activity, the patient has been regularly exercising at the [facility], focusing on cardio workouts on a recumbent machine  and strength training on various machines. He reports that this routine has helped to strengthen his muscles and reduce pain and stiffness. He expresses a desire to continue this routine to maintain his muscle tone and overall strength as he ages.  The patient's blood pressure was slightly elevated during the consultation, but he does not wish to add any more medications to his current regimen. Instead, he plans to focus on improving his diet and exercise habits to manage his health.         Review of Systems     Objective:    Physical Exam   VITALS: BP- 144/78, BP- 136/74 CHEST: Lungs clear to auscultation. CARDIOVASCULAR: Heart sounds normal on auscultation. EXTREMITIES: No swelling observed in ankles.      General-in no acute distress Eyes-no discharge Lungs-respiratory rate normal, CTA CV-no murmurs,RRR Extremities skin warm dry no edema Neuro grossly normal Behavior normal, alert      Assessment & Plan:  Assessment and Plan    Diet and Exercise Recent poor dietary choices leading to weight fluctuation. Regular exercise at the gym with some strength training. -Plan to improve dietary choices and maintain regular exercise routine.  Medication Compliance Currently taking Allopurinol, Atorvastatin, Gabapentin, Hydrochlorothiazide, Lisinopril, Metformin, Potassium, and Viagra as needed. -Continue current medication regimen.  Sleep Disturbance Difficulty falling and staying asleep. Wakes up in the middle of the night. -Advised to adjust bedtime habits and avoid electronic devices before sleep.  Influenza Vaccination Uncertain if received this year. -Administer influenza vaccine today.  Blood Pressure Slightly elevated at 136/74. -Continue current antihypertensive regimen and monitor blood pressure.  Laboratory Monitoring Due for routine blood work including uric  acid, cholesterol, and A1C. -Order labs and check urine for protein to assess kidney  function.  Follow-up Next complete check in six months.     1. Essential hypertension, benign (Primary) HTN- patient seen for follow-up regarding HTN.   Diet, medication compliance, appropriate labs and refills were completed.   Importance of keeping blood pressure under good control to lessen the risk of complications discussed Regular follow-up visits discussed  - Uric Acid  2. Hyperlipidemia, unspecified hyperlipidemia type Hyperlipidemia-importance of diet, weight control, activity, compliance with medications discussed.   Recent labs reviewed.   Any additional labs or refills ordered.   Importance of keeping under good control discussed. Regular follow-up visits discussed  - Lipid Panel  3. Prediabetes Portion control regular physical activity - Hemoglobin A1c - Microalbumin/Creatinine Ratio, Urine  4. Gout, unspecified cause, unspecified chronicity, unspecified site Continue allopurinol check lab work - Uric Acid  5. Immunization due Flu shot today - Flu vaccine trivalent PF, 6mos and older(Flulaval,Afluria,Fluarix,Fluzone)   Follow-up 6 months comprehensive lab work and wellness

## 2023-12-23 DIAGNOSIS — E785 Hyperlipidemia, unspecified: Secondary | ICD-10-CM | POA: Diagnosis not present

## 2023-12-23 DIAGNOSIS — I10 Essential (primary) hypertension: Secondary | ICD-10-CM | POA: Diagnosis not present

## 2023-12-23 DIAGNOSIS — M109 Gout, unspecified: Secondary | ICD-10-CM | POA: Diagnosis not present

## 2023-12-23 DIAGNOSIS — R7303 Prediabetes: Secondary | ICD-10-CM | POA: Diagnosis not present

## 2023-12-24 ENCOUNTER — Encounter: Payer: Self-pay | Admitting: Family Medicine

## 2023-12-24 LAB — LIPID PANEL
Chol/HDL Ratio: 3.9 {ratio} (ref 0.0–5.0)
Cholesterol, Total: 177 mg/dL (ref 100–199)
HDL: 45 mg/dL (ref >39)
LDL Chol Calc (NIH): 117 mg/dL — ABNORMAL HIGH (ref 0–99)
Triglycerides: 78 mg/dL (ref 0–149)
VLDL Cholesterol Cal: 15 mg/dL (ref 5–40)

## 2023-12-24 LAB — URIC ACID: Uric Acid: 6.7 mg/dL (ref 3.8–8.4)

## 2023-12-24 LAB — MICROALBUMIN / CREATININE URINE RATIO
Creatinine, Urine: 121.9 mg/dL
Microalb/Creat Ratio: <2 mg/g{creat} (ref 0–29)
Microalbumin, Urine: <3 ug/mL

## 2023-12-24 LAB — HEMOGLOBIN A1C
Est. average glucose Bld gHb Est-mCnc: 120 mg/dL
Hgb A1c MFr Bld: 5.8 % — ABNORMAL HIGH (ref 4.8–5.6)

## 2024-01-02 ENCOUNTER — Other Ambulatory Visit: Payer: Self-pay

## 2024-01-02 DIAGNOSIS — E1169 Type 2 diabetes mellitus with other specified complication: Secondary | ICD-10-CM

## 2024-01-02 MED ORDER — EZETIMIBE 10 MG PO TABS: 10.0000 mg | ORAL_TABLET | Freq: Every day | ORAL | 3 refills | Status: DC

## 2024-03-01 ENCOUNTER — Encounter: Payer: Self-pay | Admitting: Internal Medicine

## 2024-03-15 ENCOUNTER — Encounter: Payer: Self-pay | Admitting: *Deleted

## 2024-05-03 ENCOUNTER — Telehealth: Payer: Self-pay | Admitting: Pharmacist

## 2024-05-03 NOTE — Telephone Encounter (Signed)
   This patient is appearing on a report for being at risk of failing the adherence measure for cholesterol (statin) and diabetes medications this calendar year.   Medication: atorvastatin /metformin  Last fill date: 12/12/2023 for 90 day supply  Left voicemail for patient to return my call at their convenience.   Dywane Peruski Dattero Masiel Gentzler, PharmD, BCACP, CPP Clinical Pharmacist, [facility]

## 2024-05-24 DIAGNOSIS — H524 Presbyopia: Secondary | ICD-10-CM | POA: Diagnosis not present

## 2024-05-24 DIAGNOSIS — H401221 Low-tension glaucoma, left eye, mild stage: Secondary | ICD-10-CM | POA: Diagnosis not present

## 2024-05-25 ENCOUNTER — Ambulatory Visit (INDEPENDENT_AMBULATORY_CARE_PROVIDER_SITE_OTHER): Payer: Medicare HMO

## 2024-05-25 VITALS — Ht 71.0 in | Wt 242.0 lb

## 2024-05-25 DIAGNOSIS — Z0000 Encounter for general adult medical examination without abnormal findings: Secondary | ICD-10-CM | POA: Diagnosis not present

## 2024-05-25 NOTE — Patient Instructions (Signed)
 Mr. Gaulin , Thank you for taking time out of your busy schedule to complete your Annual Wellness Visit with me. I enjoyed our conversation and look forward to speaking with you again next year. I, as well as your care team,  appreciate your ongoing commitment to your health goals. Please review the following plan we discussed and let me know if I can assist you in the future. Your Game plan/ To Do List     Follow up Visits: Next Medicare AWV with our clinical staff: In 1 year    Have you seen your provider in the last 6 months (3 months if uncontrolled diabetes)? Yes Next Office Visit with your provider: 06/12/24 @ 11  Clinician Recommendations:  Aim for 30 minutes of exercise or brisk walking, 6-8 glasses of water , and 5 servings of fruits and vegetables each day.       This is a list of the screening recommended for you and due dates:  Health Maintenance  Topic Date Due   Zoster (Shingles) Vaccine (1 of 2) Never done   COVID-19 Vaccine (3 - Moderna risk series) 03/26/2020   Complete foot exam   12/09/2023   DTaP/Tdap/Td vaccine (2 - Tdap) 04/23/2024   Flu Shot  06/15/2024   Hemoglobin A1C  06/21/2024   Yearly kidney function blood test for diabetes  10/05/2024   Eye exam for diabetics  11/23/2024   Yearly kidney health urinalysis for diabetes  12/22/2024   Medicare Annual Wellness Visit  05/25/2025   Colon Cancer Screening  10/26/2032   Hepatitis C Screening  Completed   HIV Screening  Completed   Pneumococcal Vaccination  Aged Out   Hepatitis B Vaccine  Aged Out   HPV Vaccine  Aged Out   Meningitis B Vaccine  Aged Out    Advanced directives: (ACP Link)Information on Advanced Care Planning can be found at Optician, dispensing Health Care Directives Advance Health Care Directives. [email or web address]   Advance Care Planning is important because it:  [x]  Makes sure you receive the medical care that is consistent with your values, goals, and preferences  [x]  It  provides guidance to your family and loved ones and reduces their decisional burden about whether or not they are making the right decisions based on your wishes.  Follow the link provided in your after visit summary or read over the paperwork we have mailed to you to help you started getting your Advance Directives in place. If you need assistance in completing these, please reach out to us  so that we can help you!  See attachments for Preventive Care and Fall Prevention Tips.

## 2024-05-25 NOTE — Progress Notes (Signed)
 Subjective:   Manuel Torres is a 63 y.o. who presents for a Medicare Wellness preventive visit.  As a reminder, Annual Wellness Visits don't include a physical exam, and some assessments may be limited, especially if this visit is performed virtually. We may recommend an in-person follow-up visit with your provider if needed.  Visit Complete: Virtual I connected with  Manuel Torres on 05/25/24 by a audio enabled telemedicine application and verified that I am speaking with the correct person using two identifiers.  Patient Location: Home  Provider Location: Home Office  I discussed the limitations of evaluation and management by telemedicine. The patient expressed understanding and agreed to proceed.  Vital Signs: Because this visit was a virtual/telehealth visit, some criteria may be missing or patient reported. Any vitals not documented were not able to be obtained and vitals that have been documented are patient reported.  VideoDeclined- This patient declined Librarian, academic. Therefore the visit was completed with audio only.  Persons Participating in Visit: Patient.  AWV Questionnaire: Yes: Patient Medicare AWV questionnaire was completed by the patient on 05/24/24; I have confirmed that all information answered by patient is correct and no changes since this date.  Cardiac Risk Factors include: advanced age (>53men, >52 women);dyslipidemia;hypertension;male gender     Objective:    Today's Vitals   05/25/24 1041  Weight: 242 lb (109.8 kg)  Height: 5' 11 (1.803 m)   Body mass index is 33.75 kg/m.     05/25/2024   11:04 AM 05/13/2023   11:20 AM 10/26/2022    9:19 AM 02/02/2017    1:30 PM 01/27/2017    2:40 PM 05/19/2016    7:48 PM 04/13/2016    9:03 AM  Advanced Directives  Does Patient Have a Medical Advance Directive? No No No No  No  No  No   Would patient like information on creating a medical advance directive? Yes  (MAU/Ambulatory/Procedural Areas - Information given) No - Patient declined  No - Patient declined  No - Patient declined   No - patient declined information      Data saved with a previous flowsheet row definition    Current Medications (verified) Outpatient Encounter Medications as of 05/25/2024  Medication Sig   allopurinol  (ZYLOPRIM ) 300 MG tablet Take 1 tablet (300 mg total) by mouth daily.   atorvastatin  (LIPITOR) 80 MG tablet Take 1 tablet (80 mg total) by mouth daily.   diclofenac  (VOLTAREN ) 75 MG EC tablet Take 1 tablet (75 mg total) by mouth 2 (two) times daily.   gabapentin  (NEURONTIN ) 300 MG capsule Take 1 capsule (300 mg total) by mouth 2 (two) times daily.   hydrochlorothiazide  (HYDRODIURIL ) 25 MG tablet Take 1 tablet (25 mg total) by mouth daily.   latanoprost (XALATAN) 0.005 % ophthalmic solution Place 1 drop into both eyes at bedtime.   lisinopril  (ZESTRIL ) 10 MG tablet Take 1 tablet (10 mg total) by mouth daily.   metFORMIN  (GLUCOPHAGE ) 500 MG tablet TAKE 1/2 TABLET BY MOUTH TWICE A DAY   potassium chloride  SA (KLOR-CON  M20) 20 MEQ tablet TAKE 1 TABLET (20 MEQ TOTAL) BY MOUTH EVERY MORNING.   sildenafil  (VIAGRA ) 100 MG tablet TAKE 1/2 - 1 TABLET BY MOUTH BEFORE RELATIONS.   vitamin E 180 MG (400 UNITS) capsule Take 400 Units by mouth daily.   ezetimibe  (ZETIA ) 10 MG tablet Take 1 tablet (10 mg total) by mouth daily. (Patient not taking: Reported on 05/25/2024)   No facility-administered encounter medications  on file as of 05/25/2024.    Allergies (verified) Robaxin  [methocarbamol ]   History: Past Medical History:  Diagnosis Date   Arthritis    Hip   Fatty liver 08/10/2017   Gout    NO RECENT FLARE UPS   Gout    Hypercholesteremia    Hypertension    Past Surgical History:  Procedure Laterality Date   BACK SURGERY     April 22, 2016, laminectomy    COLONOSCOPY  08/03/2012   Procedure: COLONOSCOPY;  Surgeon: Claudis RAYMOND Rivet, MD;  Location: [facility];  Service:  Endoscopy;  Laterality: N/A;  830   COLONOSCOPY WITH PROPOFOL  N/A 10/26/2022   Procedure: COLONOSCOPY WITH PROPOFOL ;  Surgeon: Cindie Carlin POUR, DO;  Location: [facility];  Service: Endoscopy;  Laterality: N/A;  10:30am, asa 2   JOINT REPLACEMENT     r hip Dr. Hiram Dec 2013   right inguinal hernia     TOTAL HIP ARTHROPLASTY  10/23/2012   Procedure: TOTAL HIP ARTHROPLASTY;  Surgeon: Dempsey LULLA Moan, MD;  Location: [facility];  Service: Orthopedics;  Laterality: Right;   TOTAL HIP ARTHROPLASTY Left 02/02/2017   Procedure: LEFT TOTAL HIP ARTHROPLASTY ANTERIOR APPROACH;  Surgeon: Dempsey Moan, MD;  Location: [facility];  Service: Orthopedics;  Laterality: Left;   Family History  Problem Relation Age of Onset   Colon cancer Other    Diabetes Father    Heart disease Father    Hyperlipidemia Father    Social History   Socioeconomic History   Marital status: Married    Spouse name: Not on file   Number of children: Not on file   Years of education: Not on file   Highest education level: Associate degree: occupational, Scientist, product/process development, or vocational program  Occupational History   Not on file  Tobacco Use   Smoking status: Never    Passive exposure: Current   Smokeless tobacco: Never  Substance and Sexual Activity   Alcohol use: Yes    Comment: occasionally   Drug use: No   Sexual activity: Not on file  Other Topics Concern   Not on file  Social History Narrative   Not on file   Social Drivers of Health   Financial Resource Strain: Low Risk  (05/24/2024)   Overall Financial Resource Strain (CARDIA)    Difficulty of Paying Living Expenses: Not very hard  Food Insecurity: No Food Insecurity (05/24/2024)   Hunger Vital Sign    Worried About Running Out of Food in the Last Year: Never true    Ran Out of Food in the Last Year: Never true  Transportation Needs: No Transportation Needs (05/24/2024)   PRAPARE - Administrator, Civil Service (Medical): No    Lack of Transportation  (Non-Medical): No  Physical Activity: Sufficiently Active (05/24/2024)   Exercise Vital Sign    Days of Exercise per Week: 4 days    Minutes of Exercise per Session: 40 min  Stress: No Stress Concern Present (05/24/2024)   [employer] of Occupational Health - Occupational Stress Questionnaire    Feeling of Stress: Only a little  Social Connections: Socially Integrated (05/24/2024)   Social Connection and Isolation Panel    Frequency of Communication with Friends and Family: Three times a week    Frequency of Social Gatherings with Friends and Family: Three times a week    Attends Religious Services: More than 4 times per year    Active Member of Clubs or Organizations: No  Attends Engineer, structural: More than 4 times per year    Marital Status: Married    Tobacco Counseling Counseling given: Not Answered    Clinical Intake:  Pre-visit preparation completed: Yes  Pain : No/denies pain     Diabetes: No CBG done?: No Did pt. bring in CBG monitor from home?: No  Lab Results  Component Value Date   HGBA1C 5.8 (H) 12/23/2023   HGBA1C 5.6 03/23/2023   HGBA1C 5.6 12/20/2022     How often do you need to have someone help you when you read instructions, pamphlets, or other written materials from your doctor or pharmacy?: 1 - Never  Interpreter Needed?: No  Information entered by :: Charmaine Bloodgood LPN   Activities of Daily Living     05/25/2024   10:57 AM  In your present state of health, do you have any difficulty performing the following activities:  Hearing? 0  Vision? 0  Difficulty concentrating or making decisions? 0  Walking or climbing stairs? 0  Dressing or bathing? 0  Doing errands, shopping? 0  Preparing Food and eating ? N  Using the Toilet? N  In the past six months, have you accidently leaked urine? N  Do you have problems with loss of bowel control? N  Managing your Medications? N  Managing your Finances? N  Housekeeping or managing  your Housekeeping? N    Patient Care Team: Alphonsa Glendia LABOR, MD as PCP - General (Family Medicine) Cindie Carlin POUR, DO as Consulting Physician (Gastroenterology) Dr Willma [facility], OD  I have updated your Care Teams any recent Medical Services you may have received from other providers in the past year.     Assessment:   This is a routine wellness examination for Manuel Torres.  Hearing/Vision screen Hearing Screening - Comments:: Denies hearing difficulties   Vision Screening - Comments:: Wears rx glasses - up to date with routine eye exams with Dr. Willma Moats    Goals Addressed             This Visit's Progress    Increase physical activity   On track      Depression Screen     05/25/2024   11:03 AM 12/12/2023   10:37 AM 06/08/2023    9:58 AM 05/13/2023   11:16 AM 02/02/2023    1:23 PM 12/08/2022   10:36 AM 11/10/2022    9:19 AM  PHQ 2/9 Scores  PHQ - 2 Score 0 0 0 0 0 0 0  PHQ- 9 Score  1 0  0 0 3    Fall Risk     05/25/2024   11:05 AM 12/12/2023   10:36 AM 06/08/2023    9:58 AM 05/13/2023   11:20 AM 02/02/2023    1:23 PM  Fall Risk   Falls in the past year? 0 0 0 0 0  Number falls in past yr: 0  0 0   Injury with Fall? 0  0 0   Risk for fall due to : No Fall Risks   No Fall Risks No Fall Risks  Follow up Falls prevention discussed;Education provided;Falls evaluation completed   Falls prevention discussed Falls evaluation completed    MEDICARE RISK AT HOME:  Medicare Risk at Home Any stairs in or around the home?: No If so, are there any without handrails?: No Home free of loose throw rugs in walkways, pet beds, electrical cords, etc?: Yes Adequate lighting in your home to reduce risk of falls?:  Yes Life alert?: No Use of a cane, walker or w/c?: No Grab bars in the bathroom?: Yes Shower chair or bench in shower?: No Elevated toilet seat or a handicapped toilet?: Yes  TIMED UP AND GO:  Was the test performed?  No  Cognitive Function:  6CIT completed        05/25/2024   11:05 AM 05/13/2023   11:20 AM  6CIT Screen  What Year? 0 points 0 points  What month? 0 points 0 points  What time? 0 points 0 points  Count back from 20 0 points 0 points  Months in reverse 0 points 0 points  Repeat phrase 0 points 0 points  Total Score 0 points 0 points    Immunizations Immunization History  Administered Date(s) Administered   Influenza, Seasonal, Injecte, Preservative Fre 12/12/2023   Influenza,inj,Quad PF,6+ Mos 10/04/2016, 07/26/2017, 11/10/2022   Influenza,inj,quad, With Preservative 09/15/2017   Moderna Sars-Covid-2 Vaccination 01/26/2020, 02/27/2020   Pneumococcal Polysaccharide-23 06/23/2020   Td 04/23/2014    Screening Tests Health Maintenance  Topic Date Due   Zoster Vaccines- Shingrix (1 of 2) Never done   COVID-19 Vaccine (3 - Moderna risk series) 03/26/2020   FOOT EXAM  12/09/2023   DTaP/Tdap/Td (2 - Tdap) 04/23/2024   INFLUENZA VACCINE  06/15/2024   HEMOGLOBIN A1C  06/21/2024   Diabetic kidney evaluation - eGFR measurement  10/05/2024   OPHTHALMOLOGY EXAM  11/23/2024   Diabetic kidney evaluation - Urine ACR  12/22/2024   Medicare Annual Wellness (AWV)  05/25/2025   Colonoscopy  10/26/2032   Hepatitis C Screening  Completed   HIV Screening  Completed   Pneumococcal Vaccine 20-13 Years old  Aged Out   Hepatitis B Vaccines  Aged Out   HPV VACCINES  Aged Out   Meningococcal B Vaccine  Aged Out    Health Maintenance  Health Maintenance Due  Topic Date Due   Zoster Vaccines- Shingrix (1 of 2) Never done   COVID-19 Vaccine (3 - Moderna risk series) 03/26/2020   FOOT EXAM  12/09/2023   DTaP/Tdap/Td (2 - Tdap) 04/23/2024   Health Maintenance Items Addressed: Information provided on shingrix   Additional Screening:  Vision Screening: Recommended annual ophthalmology exams for early detection of glaucoma and other disorders of the eye. Would you like a referral to an eye doctor? No    Dental  Screening: Recommended annual dental exams for proper oral hygiene  Community Resource Referral / Chronic Care Management: CRR required this visit?  No   CCM required this visit?  No   Plan:    I have personally reviewed and noted the following in the patient's chart:   Medical and social history Use of alcohol, tobacco or illicit drugs  Current medications and supplements including opioid prescriptions. Patient is not currently taking opioid prescriptions. Functional ability and status Nutritional status Physical activity Advanced directives List of other physicians Hospitalizations, surgeries, and ER visits in previous 12 months Vitals Screenings to include cognitive, depression, and falls Referrals and appointments  In addition, I have reviewed and discussed with patient certain preventive protocols, quality metrics, and best practice recommendations. A written personalized care plan for preventive services as well as general preventive health recommendations were provided to patient.   Lavelle Pfeiffer [place], [place]   2/88/7974   After Visit Summary: (MyChart) Due to this being a telephonic visit, the after visit summary with patients personalized plan was offered to patient via MyChart   Notes: PCP Follow Up Recommendations: Patient wasn't aware  of last lab results and never started Zetia .  Plans to make some lifestyle modifications and plans to discuss further with pcp at next visit

## 2024-06-12 ENCOUNTER — Encounter: Payer: Self-pay | Admitting: Family Medicine

## 2024-06-12 ENCOUNTER — Ambulatory Visit (INDEPENDENT_AMBULATORY_CARE_PROVIDER_SITE_OTHER): Payer: Medicare HMO | Admitting: Family Medicine

## 2024-06-12 VITALS — BP 132/82 | HR 66 | Temp 98.1°F | Ht 71.0 in | Wt 243.0 lb

## 2024-06-12 DIAGNOSIS — E785 Hyperlipidemia, unspecified: Secondary | ICD-10-CM | POA: Diagnosis not present

## 2024-06-12 DIAGNOSIS — R7303 Prediabetes: Secondary | ICD-10-CM

## 2024-06-12 DIAGNOSIS — Z125 Encounter for screening for malignant neoplasm of prostate: Secondary | ICD-10-CM

## 2024-06-12 DIAGNOSIS — M109 Gout, unspecified: Secondary | ICD-10-CM

## 2024-06-12 DIAGNOSIS — I10 Essential (primary) hypertension: Secondary | ICD-10-CM | POA: Diagnosis not present

## 2024-06-12 DIAGNOSIS — Z8739 Personal history of other diseases of the musculoskeletal system and connective tissue: Secondary | ICD-10-CM

## 2024-06-12 DIAGNOSIS — E1169 Type 2 diabetes mellitus with other specified complication: Secondary | ICD-10-CM | POA: Diagnosis not present

## 2024-06-12 MED ORDER — GABAPENTIN 300 MG PO CAPS: 300.0000 mg | ORAL_CAPSULE | Freq: Two times a day (BID) | ORAL | 1 refills | Status: AC

## 2024-06-12 MED ORDER — HYDROCHLOROTHIAZIDE 25 MG PO TABS: 25.0000 mg | ORAL_TABLET | Freq: Every day | ORAL | 1 refills | Status: AC

## 2024-06-12 MED ORDER — POTASSIUM CHLORIDE CRYS ER 20 MEQ PO TBCR: EXTENDED_RELEASE_TABLET | ORAL | 1 refills | Status: AC

## 2024-06-12 MED ORDER — ATORVASTATIN CALCIUM 80 MG PO TABS: 80.0000 mg | ORAL_TABLET | Freq: Every day | ORAL | 3 refills | Status: AC

## 2024-06-12 MED ORDER — LISINOPRIL 10 MG PO TABS: 10.0000 mg | ORAL_TABLET | Freq: Every day | ORAL | 1 refills | Status: AC

## 2024-06-12 MED ORDER — EZETIMIBE 10 MG PO TABS: 10.0000 mg | ORAL_TABLET | Freq: Every day | ORAL | 3 refills | Status: AC

## 2024-06-12 MED ORDER — ALLOPURINOL 300 MG PO TABS: 300.0000 mg | ORAL_TABLET | Freq: Every day | ORAL | 1 refills | Status: AC

## 2024-06-12 MED ORDER — METFORMIN HCL 500 MG PO TABS: ORAL_TABLET | ORAL | 1 refills | Status: AC

## 2024-06-12 MED ORDER — SILDENAFIL CITRATE 100 MG PO TABS: ORAL_TABLET | ORAL | 5 refills | Status: AC

## 2024-06-12 NOTE — Progress Notes (Signed)
   Subjective:    Patient ID: Manuel Torres, male    DOB: 06-Dec-1960, 63 y.o.   MRN: [id number]  HPI 6 month f/u  Hypertension  Hyperlipidemia  Gout  Prediabetes Labs were ordered today History of Present Illness   Manuel Torres is a 63 year old male who presents for a routine follow-up visit.  He has been generally well and is maintaining an active lifestyle. He experienced a knee injury last month but has resumed walking without significant discomfort. He experiences morning stiffness that improves with movement.  He is on atorvastatin  and ezetimibe  for hyperlipidemia, hydrochlorothiazide  and lisinopril  for hypertension, metformin  for diabetes, and gabapentin  twice daily. He has not had recent gout flare-ups and rarely uses diclofenac .  He has no breathing difficulties during activities, regular bowel movements, and normal urinary flow. He organizes his medications in a container and can afford them.  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          Review of Systems     Objective:   Physical Exam  General-in no acute distress Eyes-no discharge Lungs-respiratory rate normal, CTA CV-no murmurs,RRR Extremities skin warm dry no edema Neuro grossly normal Behavior normal, alert       Assessment & Plan:  Type 2 diabetes mellitus Well-controlled with metformin . - Continue metformin , half tablet twice a day. - Perform blood work in August or September to monitor A1c.  Hypertension Managed with hydrochlorothiazide  and lisinopril . Blood pressure 132/72 mmHg. - Continue hydrochlorothiazide  and lisinopril .  Hyperlipidemia Managed with atorvastatin  and ezetimibe . Previous blood work showed slightly elevated cholesterol. - Continue atorvastatin  and ezetimibe . - Perform blood work in August or September to reassess cholesterol levels.  Gout Well-controlled with no  recent flare-ups. - Continue current gout medication regimen.  Chronic pain Gabapentin . Diclofenac  available but infrequently used. - Continue gabapentin  twice daily. - Use diclofenac  as needed for pain.  Erectile dysfunction Managed with sildenafil  (Viagra ). - Refill sildenafil  (Viagra ) with 20 tablets through [facility].   moderate obesity-patient doing a good job with healthy eating portion control  Follow-up 6 months

## 2024-06-22 DIAGNOSIS — Z8739 Personal history of other diseases of the musculoskeletal system and connective tissue: Secondary | ICD-10-CM | POA: Diagnosis not present

## 2024-06-22 DIAGNOSIS — E1169 Type 2 diabetes mellitus with other specified complication: Secondary | ICD-10-CM | POA: Diagnosis not present

## 2024-06-22 DIAGNOSIS — M109 Gout, unspecified: Secondary | ICD-10-CM | POA: Diagnosis not present

## 2024-06-22 DIAGNOSIS — R7303 Prediabetes: Secondary | ICD-10-CM | POA: Diagnosis not present

## 2024-06-22 DIAGNOSIS — E785 Hyperlipidemia, unspecified: Secondary | ICD-10-CM | POA: Diagnosis not present

## 2024-06-22 DIAGNOSIS — I10 Essential (primary) hypertension: Secondary | ICD-10-CM | POA: Diagnosis not present

## 2024-06-22 DIAGNOSIS — Z125 Encounter for screening for malignant neoplasm of prostate: Secondary | ICD-10-CM | POA: Diagnosis not present

## 2024-06-23 LAB — BASIC METABOLIC PANEL WITH GFR
BUN/Creatinine Ratio: 11 (ref 10–24)
BUN: 10 mg/dL (ref 8–27)
CO2: 22 mmol/L (ref 20–29)
Calcium: 9.9 mg/dL (ref 8.6–10.2)
Chloride: 100 mmol/L (ref 96–106)
Creatinine, Ser: 0.88 mg/dL (ref 0.76–1.27)
Glucose: 107 mg/dL — ABNORMAL HIGH (ref 70–99)
Potassium: 4.7 mmol/L (ref 3.5–5.2)
Sodium: 138 mmol/L (ref 134–144)
eGFR: 96 mL/min/1.73 (ref >59)

## 2024-06-23 LAB — PSA: Prostate Specific Ag, Serum: 1.3 ng/mL (ref 0.0–4.0)

## 2024-06-23 LAB — HEPATIC FUNCTION PANEL
ALT: 50 IU/L — ABNORMAL HIGH (ref 0–44)
AST: 64 IU/L — ABNORMAL HIGH (ref 0–40)
Albumin: 4.3 g/dL (ref 3.9–4.9)
Alkaline Phosphatase: 100 IU/L (ref 44–121)
Bilirubin Total: 0.6 mg/dL (ref 0.0–1.2)
Bilirubin, Direct: 0.18 mg/dL (ref 0.00–0.40)
Total Protein: 7.3 g/dL (ref 6.0–8.5)

## 2024-06-23 LAB — LIPID PANEL
Chol/HDL Ratio: 3.3 ratio (ref 0.0–5.0)
Cholesterol, Total: 154 mg/dL (ref 100–199)
HDL: 47 mg/dL (ref >39)
LDL Chol Calc (NIH): 92 mg/dL (ref 0–99)
Triglycerides: 79 mg/dL (ref 0–149)
VLDL Cholesterol Cal: 15 mg/dL (ref 5–40)

## 2024-06-23 LAB — URIC ACID: Uric Acid: 5 mg/dL (ref 3.8–8.4)

## 2024-06-25 ENCOUNTER — Ambulatory Visit: Payer: Self-pay | Admitting: Family Medicine

## 2024-08-29 ENCOUNTER — Encounter (INDEPENDENT_AMBULATORY_CARE_PROVIDER_SITE_OTHER): Payer: Self-pay | Admitting: Gastroenterology

## 2024-09-21 DIAGNOSIS — Z833 Family history of diabetes mellitus: Secondary | ICD-10-CM | POA: Diagnosis not present

## 2024-09-21 DIAGNOSIS — Z8249 Family history of ischemic heart disease and other diseases of the circulatory system: Secondary | ICD-10-CM | POA: Diagnosis not present

## 2024-09-21 DIAGNOSIS — I10 Essential (primary) hypertension: Secondary | ICD-10-CM | POA: Diagnosis not present

## 2024-09-21 DIAGNOSIS — Z7984 Long term (current) use of oral hypoglycemic drugs: Secondary | ICD-10-CM | POA: Diagnosis not present

## 2024-09-21 DIAGNOSIS — M109 Gout, unspecified: Secondary | ICD-10-CM | POA: Diagnosis not present

## 2024-09-21 DIAGNOSIS — E669 Obesity, unspecified: Secondary | ICD-10-CM | POA: Diagnosis not present

## 2024-09-21 DIAGNOSIS — H409 Unspecified glaucoma: Secondary | ICD-10-CM | POA: Diagnosis not present

## 2024-09-21 DIAGNOSIS — E785 Hyperlipidemia, unspecified: Secondary | ICD-10-CM | POA: Diagnosis not present

## 2024-09-21 DIAGNOSIS — E1140 Type 2 diabetes mellitus with diabetic neuropathy, unspecified: Secondary | ICD-10-CM | POA: Diagnosis not present

## 2024-11-26 LAB — OPHTHALMOLOGY REPORT-SCANNED

## 2024-12-12 ENCOUNTER — Ambulatory Visit: Admitting: Family Medicine

## 2024-12-12 VITALS — BP 116/79 | HR 104 | Temp 98.2°F | Ht 71.0 in | Wt 238.1 lb

## 2024-12-12 DIAGNOSIS — K760 Fatty (change of) liver, not elsewhere classified: Secondary | ICD-10-CM

## 2024-12-12 DIAGNOSIS — E785 Hyperlipidemia, unspecified: Secondary | ICD-10-CM

## 2024-12-12 DIAGNOSIS — I10 Essential (primary) hypertension: Secondary | ICD-10-CM

## 2024-12-12 DIAGNOSIS — Z79899 Other long term (current) drug therapy: Secondary | ICD-10-CM | POA: Diagnosis not present

## 2024-12-12 DIAGNOSIS — R7401 Elevation of levels of liver transaminase levels: Secondary | ICD-10-CM | POA: Diagnosis not present

## 2024-12-12 DIAGNOSIS — R7303 Prediabetes: Secondary | ICD-10-CM | POA: Diagnosis not present

## 2024-12-12 NOTE — Progress Notes (Signed)
" ° °  Subjective:    Patient ID: Manuel Torres, male    DOB: [birth year], 64 y.o.   MRN: [id number]  HPI Patient is here for a 6 month follow up Did not state any concerns  Hyperlipidemia, unspecified hyperlipidemia type  Primary hypertension  High risk medication use  Fatty liver  Elevated transaminase level  Prediabetes Patient is taking his medicine Try to watch diet Staying physically active We did discuss his previous labs including elevated liver enzymes We talked about how this does put him at increased risk of other health issues We need to make sure he is not developing into any type of fibrosis He did have elastography about 2-1/2 years ago Blood pressure under good control   Review of Systems     Objective:   Physical Exam General-in no acute distress Eyes-no discharge Lungs-respiratory rate normal, CTA CV-no murmurs,RRR Extremities skin warm dry no edema Neuro grossly normal Behavior normal, alert        Assessment & Plan:  1. Hyperlipidemia, unspecified hyperlipidemia type (Primary) Goal is keep LDL below 100 and if possible below 70 Continue current medication Healthy diet   2. Primary hypertension Blood pressure good control continue current measures  3. High risk medication use Labs ordered  4. Fatty liver Previous elastography 2 years ago did not show cirrhosis but would benefit from a FibroSure  5. Elevated transaminase level Elevated liver enzymes persistent-check additional testing  6. Prediabetes Follow up on A1c does not qualify for GLP currently  Moderate obesity portion control regular physical activity  Follow-up 6 months  Recommend flu shot and pneumonia vaccine we do not have shots here currently "

## 2025-05-31 ENCOUNTER — Ambulatory Visit

## 2025-06-19 ENCOUNTER — Ambulatory Visit: Admitting: Family Medicine
